# Patient Record
Sex: Male | Born: 1950 | Race: White | Hispanic: No | Marital: Married | Smoking: Former smoker
Health system: Southern US, Community
[De-identification: ages and names within clinical notes are randomized; demographics above are authoritative.]

## PROBLEM LIST (undated history)

## (undated) DIAGNOSIS — C3490 Malignant neoplasm of unspecified part of unspecified bronchus or lung: Secondary | ICD-10-CM

## (undated) DIAGNOSIS — E7800 Pure hypercholesterolemia, unspecified: Secondary | ICD-10-CM

## (undated) DIAGNOSIS — G8929 Other chronic pain: Secondary | ICD-10-CM

## (undated) DIAGNOSIS — Z9989 Dependence on other enabling machines and devices: Secondary | ICD-10-CM

## (undated) DIAGNOSIS — J9611 Chronic respiratory failure with hypoxia: Secondary | ICD-10-CM

## (undated) DIAGNOSIS — M545 Low back pain, unspecified: Secondary | ICD-10-CM

## (undated) DIAGNOSIS — K529 Noninfective gastroenteritis and colitis, unspecified: Secondary | ICD-10-CM

## (undated) DIAGNOSIS — I739 Peripheral vascular disease, unspecified: Secondary | ICD-10-CM

## (undated) DIAGNOSIS — Z77098 Contact with and (suspected) exposure to other hazardous, chiefly nonmedicinal, chemicals: Secondary | ICD-10-CM

## (undated) DIAGNOSIS — I719 Aortic aneurysm of unspecified site, without rupture: Secondary | ICD-10-CM

## (undated) DIAGNOSIS — M549 Dorsalgia, unspecified: Secondary | ICD-10-CM

## (undated) DIAGNOSIS — G4733 Obstructive sleep apnea (adult) (pediatric): Secondary | ICD-10-CM

## (undated) DIAGNOSIS — R0602 Shortness of breath: Secondary | ICD-10-CM

## (undated) DIAGNOSIS — K219 Gastro-esophageal reflux disease without esophagitis: Secondary | ICD-10-CM

## (undated) DIAGNOSIS — N400 Enlarged prostate without lower urinary tract symptoms: Secondary | ICD-10-CM

## (undated) DIAGNOSIS — J449 Chronic obstructive pulmonary disease, unspecified: Secondary | ICD-10-CM

## (undated) DIAGNOSIS — I10 Essential (primary) hypertension: Secondary | ICD-10-CM

## (undated) HISTORY — PX: COLON SURGERY: SHX602

## (undated) HISTORY — DX: Aortic aneurysm of unspecified site, without rupture: I71.9

## (undated) HISTORY — PX: FEMORAL ARTERY STENT: SHX1583

## (undated) HISTORY — DX: Contact with and (suspected) exposure to other hazardous, chiefly nonmedicinal, chemicals: Z77.098

---

## 2003-10-02 HISTORY — PX: LUNG LOBECTOMY: SHX167

## 2008-05-22 ENCOUNTER — Encounter: Admission: RE | Admit: 2008-05-22 | Discharge: 2008-05-22 | Payer: Self-pay | Admitting: Neurosurgery

## 2009-05-12 ENCOUNTER — Encounter: Payer: Self-pay | Admitting: Internal Medicine

## 2009-09-19 ENCOUNTER — Encounter: Payer: Self-pay | Admitting: Internal Medicine

## 2009-10-27 ENCOUNTER — Encounter: Payer: Self-pay | Admitting: Internal Medicine

## 2009-11-01 ENCOUNTER — Ambulatory Visit: Payer: Self-pay | Admitting: Internal Medicine

## 2009-11-01 DIAGNOSIS — N400 Enlarged prostate without lower urinary tract symptoms: Secondary | ICD-10-CM

## 2009-11-01 DIAGNOSIS — I739 Peripheral vascular disease, unspecified: Secondary | ICD-10-CM | POA: Insufficient documentation

## 2009-11-01 DIAGNOSIS — Z85118 Personal history of other malignant neoplasm of bronchus and lung: Secondary | ICD-10-CM

## 2009-11-01 DIAGNOSIS — Z8719 Personal history of other diseases of the digestive system: Secondary | ICD-10-CM

## 2009-11-01 DIAGNOSIS — K219 Gastro-esophageal reflux disease without esophagitis: Secondary | ICD-10-CM

## 2009-11-01 DIAGNOSIS — I10 Essential (primary) hypertension: Secondary | ICD-10-CM | POA: Insufficient documentation

## 2009-11-01 DIAGNOSIS — E785 Hyperlipidemia, unspecified: Secondary | ICD-10-CM | POA: Insufficient documentation

## 2009-11-01 DIAGNOSIS — M5126 Other intervertebral disc displacement, lumbar region: Secondary | ICD-10-CM

## 2009-11-01 DIAGNOSIS — M545 Low back pain: Secondary | ICD-10-CM | POA: Insufficient documentation

## 2009-11-01 HISTORY — DX: Personal history of other malignant neoplasm of bronchus and lung: Z85.118

## 2009-11-01 LAB — CONVERTED CEMR LAB: Cholesterol, target level: 200 mg/dL

## 2009-11-30 ENCOUNTER — Ambulatory Visit: Payer: Self-pay | Admitting: Internal Medicine

## 2009-12-21 ENCOUNTER — Encounter
Admission: RE | Admit: 2009-12-21 | Discharge: 2009-12-23 | Payer: Self-pay | Admitting: Physical Medicine & Rehabilitation

## 2009-12-23 ENCOUNTER — Ambulatory Visit: Payer: Self-pay | Admitting: Physical Medicine & Rehabilitation

## 2010-10-31 NOTE — Assessment & Plan Note (Signed)
Summary: 1 MO ROV /NWS  #   Vital Signs:  Patient profile:   60 year old male Height:      74 inches Weight:      201 pounds BMI:     25.90 O2 Sat:      93 % on Room air Temp:     97.7 degrees F oral Pulse rate:   90 / minute Pulse rhythm:   regular Resp:     16 per minute BP sitting:   138 / 86  (left arm) Cuff size:   large  Vitals Entered By: Rock Nephew CMA (November 30, 2009 8:34 AM)  Nutrition Counseling: Patient's BMI is greater than 25 and therefore counseled on weight management options.  O2 Flow:  Room air  Primary Care Provider:  Etta Grandchild MD   History of Present Illness: He returns for f/up and states that he feels much better. He went to the [place] recently and had a 6 month check-up and they added some meds for LBP and high trigs.  Dyspepsia History:      There is a prior history of GERD.  The patient does not have a prior history of documented ulcer disease.    Hypertension History:      He denies headache, chest pain, palpitations, dyspnea with exertion, orthopnea, PND, peripheral edema, visual symptoms, neurologic problems, syncope, and side effects from treatment.  He notes no problems with any antihypertensive medication side effects.        Positive major cardiovascular risk factors include male age 10 years old or older, hyperlipidemia, and hypertension.  Negative major cardiovascular risk factors include no history of diabetes, negative family history for ischemic heart disease, and non-tobacco-user status.        Positive history for target organ damage include peripheral vascular disease.  Further assessment for target organ damage reveals no history of ASHD, cardiac end-organ damage (CHF/LVH), stroke/TIA, renal insufficiency, or hypertensive retinopathy.    Lipid Management History:      Positive NCEP/ATP III risk factors include male age 39 years old or older, hypertension, and peripheral vascular disease.  Negative NCEP/ATP III risk factors include  non-diabetic, no family history for ischemic heart disease, non-tobacco-user status, no ASHD (atherosclerotic heart disease), no prior stroke/TIA, and no history of aortic aneurysm.        The patient states that he knows about the "Therapeutic Lifestyle Change" diet.  His compliance with the TLC diet is good.  The patient expresses understanding of adjunctive measures for cholesterol lowering.  Adjunctive measures started by the patient include aerobic exercise, fiber, limit alcohol consumpton, and weight reduction.  He expresses no side effects from his lipid-lowering medication.  The patient denies any symptoms to suggest myopathy or liver disease.       Preventive Screening-Counseling & Management  Alcohol-Tobacco     Alcohol drinks/day: 0     Smoking Status: quit     Smoke Cessation Stage: quit     Year Started: 1966     Year Quit: 2005     Pack years: 40  Hep-HIV-STD-Contraception     Hepatitis Risk: no risk noted     HIV Risk: no risk noted     STD Risk: no risk noted  Medications Prior to Update: 1)  Theochron 300 Mg Xr12h-Tab (Theophylline) .... Take 1 Tablet By Mouth Two Times A Day 2)  Simvastatin 20 Mg Tabs (Simvastatin) .... Take 1 Tablet By Mouth Once A Day 3)  Klor-Con  M20 20 Meq Cr-Tabs (Potassium Chloride Crys Cr) .... Take 1 Tablet By Mouth Once A Day 4)  Omeprazole 20 Mg Tbec (Omeprazole) .... Take 1 Tablet By Mouth Two Times A Day 5)  Proair Hfa 108 (90 Base) Mcg/act Aers (Albuterol Sulfate) .Marland Kitchen.. 1-2 Puffs Prn 6)  Albuterol .Marland Kitchen.. 1 Vial Qid 7)  Terazosin 2mg  .... Take 1 Tablet By Mouth Once A Day 8)  Doxycycline Hyclate 100 Mg Caps (Doxycycline Hyclate) .Marland Kitchen.. 1 Every 12hrs 9)  Prednisone 20 Mg Tabs (Prednisone) .... Take 1 Tablet By Mouth Three Times A Day 10)  Spiriva Handihaler 18 Mcg Caps (Tiotropium Bromide Monohydrate) .... One Puff Once Daily 11)  Dulera 200-5 Mcg/act Aero (Mometasone Furo-Formoterol Fum) .... 2 Puffs Bid 12)  Valturna 150-160 Mg Tabs  (Aliskiren-Valsartan) .... Once Daily For High Blood Pressure  Current Medications (verified): 1)  Simvastatin 20 Mg Tabs (Simvastatin) .... Take 1 Tablet By Mouth Once A Day 2)  Klor-Con M20 20 Meq Cr-Tabs (Potassium Chloride Crys Cr) .... Take 1 Tablet By Mouth Once A Day 3)  Omeprazole 20 Mg Tbec (Omeprazole) .... Take 1 Tablet By Mouth Two Times A Day 4)  Proair Hfa 108 (90 Base) Mcg/act Aers (Albuterol Sulfate) .Marland Kitchen.. 1-2 Puffs Prn 5)  Albuterol .Marland Kitchen.. 1 Vial Qid 6)  Terazosin 2mg  .... Take 1 Tablet By Mouth Once A Day 7)  Spiriva Handihaler 18 Mcg Caps (Tiotropium Bromide Monohydrate) .... One Puff Once Daily 8)  Dulera 200-5 Mcg/act Aero (Mometasone Furo-Formoterol Fum) .... 2 Puffs Bid 9)  Valturna 150-160 Mg Tabs (Aliskiren-Valsartan) .... Once Daily For High Blood Pressure 10)  Tramadol Hcl 50 Mg Tabs (Tramadol Hcl) .... 2 Three Times A Day 11)  Amitriptyline Hcl 25 Mg Tabs (Amitriptyline Hcl) .... Take 1 Tab By Mouth At Bedtime 12)  Hydrocodone-Acetaminophen 10-500 Mg Tabs (Hydrocodone-Acetaminophen) .Marland Kitchen.. 1 Every 6hrs Prn 13)  Genifibrosic 600mg  .... Take 1 Tablet By Mouth Two Times A Day  Allergies (verified): 1)  ! Penicillin 2)  ! * Meloxicam 3)  ! Ace Inhibitors 4)  ! Amlodipine Besy-Benazepril Hcl (Amlodipine Besy-Benazepril Hcl)  Past History:  Past Medical History: Reviewed history from 11/01/2009 and no changes required. Lung cancer, hx of: treated at [facility] in 2005 with surgery and chemo. COPD Diverticulitis, hx of GERD Hyperlipidemia Hypertension Low back pain Peripheral vascular disease Benign prostatic hypertrophy  Past Surgical History: Reviewed history from 11/01/2009 and no changes required. Lung-wedge resection stent in left groin  Family History: Reviewed history from 11/01/2009 and no changes required. Family History of Sudden Death both parents at an early age  Social History: Reviewed history from 11/01/2009 and no changes  required. Retired Married Alcohol use-no Drug use-no Regular exercise-yes  Review of Systems  The patient denies anorexia, weight loss, chest pain, peripheral edema, prolonged cough, abdominal pain, hematuria, difficulty walking, and depression.   Resp:  Complains of wheezing; denies chest discomfort, chest pain with inspiration, cough, coughing up blood, pleuritic, shortness of breath, and sputum productive.  Physical Exam  General:  alert, well-developed, well-nourished, well-hydrated, appropriate dress, normal appearance, healthy-appearing, cooperative to examination, good hygiene, and underweight appearing.   Head:  normocephalic, atraumatic, no abnormalities observed, and no abnormalities palpated.   Mouth:  Oral mucosa and oropharynx without lesions or exudates.  Teeth in good repair. Neck:  supple, full ROM, no masses, no thyromegaly, no thyroid nodules or tenderness, no JVD, normal carotid upstroke, no carotid bruits, and no cervical lymphadenopathy.   Lungs:  Normal respiratory effort, chest  expands symmetrically. Lungs are clear to auscultation, no crackles or wheezes. Heart:  Normal rate and regular rhythm. S1 and S2 normal without gallop, murmur, click, rub or other extra sounds. Abdomen:  soft, non-tender, normal bowel sounds, no distention, no masses, no guarding, no rigidity, no rebound tenderness, no abdominal hernia, no inguinal hernia, no hepatomegaly, and no splenomegaly.   Msk:  normal ROM, no joint tenderness, no joint swelling, no joint warmth, no redness over joints, no joint deformities, no joint instability, and no crepitation.   Extremities:  No clubbing, cyanosis, edema, or deformity noted with normal full range of motion of all joints.   Skin:  turgor normal, color normal, no rashes, no suspicious lesions, no ecchymoses, and no petechiae.   Psych:  Cognition and judgment appear intact. Alert and cooperative with normal attention span and concentration. No apparent  delusions, illusions, hallucinations   Impression & Recommendations:  Problem # 1:  HYPERTENSION (ICD-401.9) Assessment Improved  His updated medication list for this problem includes:    Valturna 150-160 Mg Tabs (Aliskiren-valsartan) ..... Once daily for high blood pressure  BP today: 138/86 Prior BP: 150/82 (11/01/2009)  Prior 10 Yr Risk Heart Disease: Not enough information (11/01/2009)  Problem # 2:  COPD (ICD-496) Assessment: Improved  The following medications were removed from the medication list:    Theochron 300 Mg Xr12h-tab (Theophylline) .Marland Kitchen... Take 1 tablet by mouth two times a day His updated medication list for this problem includes:    Proair Hfa 108 (90 Base) Mcg/act Aers (Albuterol sulfate) .Marland Kitchen... 1-2 puffs prn    Spiriva Handihaler 18 Mcg Caps (Tiotropium bromide monohydrate) ..... One puff once daily    Dulera 200-5 Mcg/act Aero (Mometasone furo-formoterol fum) .Marland Kitchen... 2 puffs bid  Pulmonary Functions Reviewed: O2 sat: 93 (11/30/2009)     Vaccines Reviewed: Pneumovax: Pneumovax (07/09/2007)     Problem # 3:  HYPERLIPIDEMIA (ICD-272.4) Assessment: Unchanged  His updated medication list for this problem includes:    Simvastatin 20 Mg Tabs (Simvastatin) .Marland Kitchen... Take 1 tablet by mouth once a day  Lipid Goals: Chol Goal: 200 (11/01/2009)   HDL Goal: 40 (11/01/2009)   LDL Goal: 100 (11/01/2009)   TG Goal: 150 (11/01/2009)  Prior 10 Yr Risk Heart Disease: Not enough information (11/01/2009)  Complete Medication List: 1)  Simvastatin 20 Mg Tabs (Simvastatin) .... Take 1 tablet by mouth once a day 2)  Klor-con M20 20 Meq Cr-tabs (Potassium chloride crys cr) .... Take 1 tablet by mouth once a day 3)  Omeprazole 20 Mg Tbec (Omeprazole) .... Take 1 tablet by mouth two times a day 4)  Proair Hfa 108 (90 Base) Mcg/act Aers (Albuterol sulfate) .Marland Kitchen.. 1-2 puffs prn 5)  Albuterol  .Marland Kitchen.. 1 vial qid 6)  Terazosin 2mg   .... Take 1 tablet by mouth once a day 7)  Spiriva  Handihaler 18 Mcg Caps (Tiotropium bromide monohydrate) .... One puff once daily 8)  Dulera 200-5 Mcg/act Aero (Mometasone furo-formoterol fum) .... 2 puffs bid 9)  Valturna 150-160 Mg Tabs (Aliskiren-valsartan) .... Once daily for high blood pressure 10)  Tramadol Hcl 50 Mg Tabs (Tramadol hcl) .... 2 three times a day 11)  Amitriptyline Hcl 25 Mg Tabs (Amitriptyline hcl) .... Take 1 tab by mouth at bedtime 12)  Hydrocodone-acetaminophen 10-500 Mg Tabs (Hydrocodone-acetaminophen) .Marland Kitchen.. 1 every 6hrs prn 13)  Genifibrosic 600mg   .... Take 1 tablet by mouth two times a day  Other Orders: Tdap => 37yrs IM (40981) Admin 1st Vaccine (19147)  Hypertension Assessment/Plan:  The patient's hypertensive risk group is category C: Target organ damage and/or diabetes.  Today's blood pressure is 138/86.  His blood pressure goal is < 140/90.  Lipid Assessment/Plan:      Based on NCEP/ATP III, the patient's risk factor category is "history of coronary disease, peripheral vascular disease, cerebrovascular disease, or aortic aneurysm".  The patient's lipid goals are as follows: Total cholesterol goal is 200; LDL cholesterol goal is 100; HDL cholesterol goal is 40; Triglyceride goal is 150.    Patient Instructions: 1)  Please schedule a follow-up appointment in 4 months. 2)  It is important that you exercise regularly at least 20 minutes 5 times a week. If you develop chest pain, have severe difficulty breathing, or feel very tired , stop exercising immediately and seek medical attention. 3)  Check your Blood Pressure regularly. If it is above 140/90: you should make an appointment. Prescriptions: VALTURNA 150-160 MG TABS (ALISKIREN-VALSARTAN) once daily for high blood pressure  #70 x 0   Entered and Authorized by:   Etta Grandchild MD   Signed by:   Etta Grandchild MD on 11/30/2009   Method used:   Samples Given   RxID:   [id number]    Immunizations Administered:  Tetanus Vaccine:    Vaccine  Type: Tdap    Site: left deltoid    Mfr: [employer]    Dose: 0.5 ml    Route: IM    Given by: Rock Nephew CMA    Exp. Date: 11/26/2011    Lot #: [id number]    VIS given: 08/19/07 version given November 30, 2009.

## 2010-10-31 NOTE — Letter (Signed)
Summary: Medication List/[facility]  Medication List/[facility]   Imported By: Sherian Rein 11/04/2009 07:19:22  _____________________________________________________________________  External Attachment:    Type:   Image     Comment:   External Document

## 2010-10-31 NOTE — Assessment & Plan Note (Signed)
Summary: NEW PT/BCBS/#/LB   Vital Signs:  Patient profile:   60 year old male Height:      74 inches Weight:      199 pounds BMI:     25.64 O2 Sat:      95 % on Room air Temp:     97.1 degrees F oral Pulse rate:   80 / minute Pulse rhythm:   regular Resp:     16 per minute BP sitting:   150 / 82  (left arm) Cuff size:   large  Vitals Entered By: Rock Nephew CMA (November 01, 2009 9:09 AM)  Nutrition Counseling: Patient's BMI is greater than 25 and therefore counseled on weight management options.  O2 Flow:  Room air  Primary Care Provider:  Etta Grandchild MD   History of Present Illness: New to me he complains of an exacerbation of COPD last week that sent him to the [facility] and [facility] where he had Chest Xrays and EKG's done. He feels better after starting prednisone and doxy., but he still  has a mild NP cough.  He is on a complicated, duplicated meds list from the [place] ( 2 ccb's, an ACEI and he coughs, and multiple inhalers ) see scanned document.   He has chronic LBP from a protruding disc ( see scanned MRI report ) and has been seen by a neurosurgeon and was told that he may need a fusion but he does not want to do surgery yet. He has not had a good response to Meloxicam or Lortab.  He had a complete physical 8 months ago at the [place].  Dyspepsia History:      He has no alarm features of dyspepsia including no history of melena, hematochezia, dysphagia, persistent vomiting, or involuntary weight loss > 5%.  There is a prior history of GERD.  The patient does not have a prior history of documented ulcer disease.  The dominant symptom is heartburn or acid reflux.  An H-2 blocker medication is currently being taken.  He notes that the symptoms have improved with the H-2 blocker therapy.  Symptoms have not persisted after 4 weeks of H-2 blocker treatment.    Hypertension History:      He complains of side effects from treatment, but denies headache, chest pain, palpitations,  orthopnea, peripheral edema, visual symptoms, neurologic problems, and syncope.  He notes the following problems with antihypertensive medication side effects: cough.        Positive major cardiovascular risk factors include male age 14 years old or older, hyperlipidemia, and hypertension.  Negative major cardiovascular risk factors include no history of diabetes, negative family history for ischemic heart disease, and non-tobacco-user status.        Positive history for target organ damage include peripheral vascular disease.  Further assessment for target organ damage reveals no history of ASHD, cardiac end-organ damage (CHF/LVH), stroke/TIA, renal insufficiency, or hypertensive retinopathy.    Lipid Management History:      Positive NCEP/ATP III risk factors include male age 52 years old or older, hypertension, and peripheral vascular disease.  Negative NCEP/ATP III risk factors include non-diabetic, no family history for ischemic heart disease, non-tobacco-user status, no ASHD (atherosclerotic heart disease), no prior stroke/TIA, and no history of aortic aneurysm.        The patient states that he knows about the "Therapeutic Lifestyle Change" diet.  His compliance with the TLC diet is good.  The patient expresses understanding of adjunctive measures for cholesterol  lowering.  Adjunctive measures started by the patient include aerobic exercise, fiber, limit alcohol consumpton, and weight reduction.  He expresses no side effects from his lipid-lowering medication.  The patient denies any symptoms to suggest myopathy or liver disease.       Preventive Screening-Counseling & Management  Alcohol-Tobacco     Alcohol drinks/day: 0     Smoking Status: quit     Smoke Cessation Stage: quit     Year Started: 1966     Year Quit: 2005     Pack years: 11  Caffeine-Diet-Exercise     Does Patient Exercise: yes  Hep-HIV-STD-Contraception     Hepatitis Risk: no risk noted     HIV Risk: no risk noted      STD Risk: no risk noted      Sexual History:  currently monogamous.        Drug Use:  never and no.        Blood Transfusions:  no.    Medications Prior to Update: 1)  None  Allergies (verified): 1)  ! Penicillin 2)  ! * Meloxicam 3)  ! Ace Inhibitors 4)  ! Amlodipine Besy-Benazepril Hcl (Amlodipine Besy-Benazepril Hcl)  Past History:  Past Medical History: Lung cancer, hx of: treated at [facility] in 2005 with surgery and chemo. COPD Diverticulitis, hx of GERD Hyperlipidemia Hypertension Low back pain Peripheral vascular disease Benign prostatic hypertrophy  Past Surgical History: Lung-wedge resection stent in left groin  Family History: Family History of Sudden Death both parents at an early age  Social History: Retired Married Alcohol use-no Drug use-no Regular exercise-yes Drug Use:  never, no Does Patient Exercise:  yes Smoking Status:  quit Hepatitis Risk:  no risk noted HIV Risk:  no risk noted STD Risk:  no risk noted Sexual History:  currently monogamous Blood Transfusions:  no  Review of Systems CV:  Complains of shortness of breath with exertion; denies bluish discoloration of lips or nails, chest pain or discomfort, difficulty breathing at night, fainting, fatigue, leg cramps with exertion, lightheadness, near fainting, palpitations, and swelling of feet. Resp:  Complains of cough and shortness of breath; denies chest discomfort, chest pain with inspiration, coughing up blood, pleuritic, sputum productive, and wheezing.  Physical Exam  General:  alert, well-developed, well-nourished, well-hydrated, appropriate dress, normal appearance, healthy-appearing, cooperative to examination, good hygiene, and underweight appearing.   Head:  normocephalic, atraumatic, no abnormalities observed, and no abnormalities palpated.   Eyes:  vision grossly intact, pupils equal, pupils round, and pupils reactive to light.   Ears:  R ear normal and L ear normal.     Nose:  External nasal examination shows no deformity or inflammation. Nasal mucosa are pink and moist without lesions or exudates. Mouth:  Oral mucosa and oropharynx without lesions or exudates.  Teeth in good repair. Neck:  supple, full ROM, no masses, no thyromegaly, no thyroid nodules or tenderness, no JVD, normal carotid upstroke, no carotid bruits, and no cervical lymphadenopathy.   Lungs:  Normal respiratory effort, chest expands symmetrically. Lungs are clear to auscultation, no crackles or wheezes. Heart:  Normal rate and regular rhythm. S1 and S2 normal without gallop, murmur, click, rub or other extra sounds. Abdomen:  soft, non-tender, normal bowel sounds, no distention, no masses, no guarding, no rigidity, no rebound tenderness, no abdominal hernia, no inguinal hernia, no hepatomegaly, and no splenomegaly.   Msk:  normal ROM, no joint tenderness, no joint swelling, no joint warmth, no redness over joints, no joint  deformities, no joint instability, and no crepitation.   Pulses:  R femoral decreased, R popliteal decreased, R posterior tibial decreased, R dorsalis pedis decreased, L femoral decreased, L popliteal decreased, L posterior tibial decreased, and L dorsalis pedis decreased.   Extremities:  No clubbing, cyanosis, edema, or deformity noted with normal full range of motion of all joints.   Neurologic:  No cranial nerve deficits noted. Station and gait are normal. Plantar reflexes are down-going bilaterally. DTRs are symmetrical throughout. Sensory, motor and coordinative functions appear intact. Skin:  turgor normal, color normal, no rashes, no suspicious lesions, no ecchymoses, and no petechiae.   Cervical Nodes:  no anterior cervical adenopathy and no posterior cervical adenopathy.   Axillary Nodes:  no R axillary adenopathy and no L axillary adenopathy.   Inguinal Nodes:  no R inguinal adenopathy and no L inguinal adenopathy.   Psych:  Cognition and judgment appear intact. Alert  and cooperative with normal attention span and concentration. 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 Take 1 tablet by mouth once a day    Hydrochlorothiazide 25 Mg Tabs (Hydrochlorothiazide) .Marland Kitchen... Take 1 tablet by mouth once a day    Cardizem La 240 Mg Xr24h-tab (Diltiazem hcl coated beads) .Marland Kitchen... 2 times daily    Amlodipine Besylate 10 Mg Tabs (Amlodipine besylate) .Marland Kitchen... Take 1 tab by mouth at bedtime His updated medication list for this problem includes:    Valturna 150-160 Mg Tabs (Aliskiren-valsartan) ..... Once daily for high blood pressure  Problem # 3:  COPD (ICD-496) Assessment: Unchanged records have been requested from the [facility] and [facility] The following medications were removed from the medication list:    Foradil Aerolizer 12 Mcg Caps (Formoterol fumarate) .Marland Kitchen... 2 times daily His updated medication list for this problem includes:    Theochron 300 Mg Xr12h-tab (Theophylline) .Marland Kitchen... Take 1 tablet by mouth two times a day    Proair Hfa 108 (90 Base) Mcg/act Aers (Albuterol sulfate) .Marland Kitchen... 1-2 puffs prn    Spiriva Handihaler 18 Mcg Caps (Tiotropium bromide monohydrate) ..... One puff once daily    Dulera 200-5 Mcg/act Aero (Mometasone furo-formoterol fum) .Marland Kitchen... 2 puffs bid  Complete Medication List: 1)  Theochron 300 Mg Xr12h-tab (Theophylline) .... Take 1 tablet by mouth two times a day 2)  Simvastatin 20 Mg Tabs (Simvastatin) .... Take 1 tablet by mouth once a day 3)  Klor-con M20 20 Meq Cr-tabs (Potassium chloride crys cr) .... Take 1 tablet by mouth once a day 4)  Omeprazole 20 Mg Tbec (Omeprazole) .... Take 1 tablet by mouth two times a day 5)  Proair  Hfa 108 (90 Base) Mcg/act Aers (Albuterol sulfate) .Marland Kitchen.. 1-2 puffs prn 6)  Albuterol  .Marland Kitchen.. 1 vial qid 7)  Terazosin 2mg   .... Take 1 tablet by mouth once a day 8)  Doxycycline Hyclate 100 Mg Caps (Doxycycline hyclate) .Marland Kitchen.. 1 every 12hrs 9)  Prednisone 20 Mg Tabs (Prednisone) .... Take 1 tablet by mouth three times a day 10)  Spiriva Handihaler 18 Mcg Caps (Tiotropium bromide monohydrate) .... One puff once daily 11)  Dulera 200-5 Mcg/act Aero (Mometasone furo-formoterol fum) .... 2 puffs bid 12)  Valturna 150-160 Mg Tabs (Aliskiren-valsartan) .... Once daily for high blood  pressure  Hypertension Assessment/Plan:      The patient's hypertensive risk group is category C: Target organ damage and/or diabetes.  Today's blood pressure is 150/82.  His blood pressure goal is < 140/90.  Lipid Assessment/Plan:      Based on NCEP/ATP III, the patient's risk factor category is "history of coronary disease, peripheral vascular disease, cerebrovascular disease, or aortic aneurysm".  The patient's lipid goals are as follows: Total cholesterol goal is 200; LDL cholesterol goal is 100; HDL cholesterol goal is 40; Triglyceride goal is 150.    Colorectal Screening:  Colonoscopy Results:    Date of Exam: 06/17/2007    Results: Normal  Immunization & Chemoprophylaxis:    Pneumovax: Pneumovax  (07/09/2007)  Patient Instructions: 1)  Please schedule a follow-up appointment in 1 month. 2)  Check your Blood Pressure regularly. If it is above 140/90: you should make an appointment. Prescriptions: SPIRIVA HANDIHALER 18 MCG CAPS (TIOTROPIUM BROMIDE MONOHYDRATE) One puff once daily  #0 x 0   Entered and Authorized by:   Etta Grandchild MD   Signed by:   Etta Grandchild MD on 11/01/2009   Method used:   Historical   RxID:   [id number] VALTURNA 150-160 MG TABS (ALISKIREN-VALSARTAN) once daily for high blood pressure  #35 x 0   Entered and Authorized by:   Etta Grandchild MD   Signed by:   Etta Grandchild MD  on 11/01/2009   Method used:   Samples Given   RxID:   [id number] DULERA 200-5 MCG/ACT AERO (MOMETASONE FURO-FORMOTEROL FUM) 2 puffs BID  #6 inhs x 0   Entered and Authorized by:   Etta Grandchild MD   Signed by:   Etta Grandchild MD on 11/01/2009   Method used:   Samples Given   RxID:   [id number]    Pneumovax Immunization History:    Pneumovax # 1:  Pneumovax (07/09/2007)

## 2012-11-07 ENCOUNTER — Inpatient Hospital Stay (HOSPITAL_COMMUNITY): Payer: BC Managed Care – PPO

## 2012-11-07 ENCOUNTER — Emergency Department (HOSPITAL_BASED_OUTPATIENT_CLINIC_OR_DEPARTMENT_OTHER): Payer: BC Managed Care – PPO

## 2012-11-07 ENCOUNTER — Inpatient Hospital Stay (HOSPITAL_BASED_OUTPATIENT_CLINIC_OR_DEPARTMENT_OTHER)
Admission: EM | Admit: 2012-11-07 | Discharge: 2012-11-17 | DRG: 585 | Disposition: A | Discharge status: Home / Self Care | Payer: BC Managed Care – PPO | Attending: Internal Medicine | Admitting: Internal Medicine

## 2012-11-07 ENCOUNTER — Encounter (HOSPITAL_BASED_OUTPATIENT_CLINIC_OR_DEPARTMENT_OTHER): Payer: Self-pay | Admitting: Family Medicine

## 2012-11-07 DIAGNOSIS — R197 Diarrhea, unspecified: Secondary | ICD-10-CM

## 2012-11-07 DIAGNOSIS — K929 Disease of digestive system, unspecified: Secondary | ICD-10-CM | POA: Diagnosis not present

## 2012-11-07 DIAGNOSIS — Z85118 Personal history of other malignant neoplasm of bronchus and lung: Secondary | ICD-10-CM

## 2012-11-07 DIAGNOSIS — R112 Nausea with vomiting, unspecified: Secondary | ICD-10-CM | POA: Diagnosis present

## 2012-11-07 DIAGNOSIS — I959 Hypotension, unspecified: Secondary | ICD-10-CM

## 2012-11-07 DIAGNOSIS — E878 Other disorders of electrolyte and fluid balance, not elsewhere classified: Secondary | ICD-10-CM | POA: Diagnosis present

## 2012-11-07 DIAGNOSIS — T68XXXA Hypothermia, initial encounter: Secondary | ICD-10-CM

## 2012-11-07 DIAGNOSIS — R7309 Other abnormal glucose: Secondary | ICD-10-CM | POA: Diagnosis present

## 2012-11-07 DIAGNOSIS — D62 Acute posthemorrhagic anemia: Secondary | ICD-10-CM | POA: Diagnosis not present

## 2012-11-07 DIAGNOSIS — R1110 Vomiting, unspecified: Secondary | ICD-10-CM

## 2012-11-07 DIAGNOSIS — R571 Hypovolemic shock: Secondary | ICD-10-CM

## 2012-11-07 DIAGNOSIS — K559 Vascular disorder of intestine, unspecified: Principal | ICD-10-CM

## 2012-11-07 DIAGNOSIS — R680 Hypothermia, not associated with low environmental temperature: Secondary | ICD-10-CM | POA: Diagnosis present

## 2012-11-07 DIAGNOSIS — E876 Hypokalemia: Secondary | ICD-10-CM | POA: Diagnosis not present

## 2012-11-07 DIAGNOSIS — N400 Enlarged prostate without lower urinary tract symptoms: Secondary | ICD-10-CM

## 2012-11-07 DIAGNOSIS — E7800 Pure hypercholesterolemia, unspecified: Secondary | ICD-10-CM | POA: Diagnosis present

## 2012-11-07 DIAGNOSIS — R404 Transient alteration of awareness: Secondary | ICD-10-CM | POA: Diagnosis not present

## 2012-11-07 DIAGNOSIS — J4489 Other specified chronic obstructive pulmonary disease: Secondary | ICD-10-CM

## 2012-11-07 DIAGNOSIS — R339 Retention of urine, unspecified: Secondary | ICD-10-CM | POA: Diagnosis not present

## 2012-11-07 DIAGNOSIS — R6510 Systemic inflammatory response syndrome (SIRS) of non-infectious origin without acute organ dysfunction: Secondary | ICD-10-CM

## 2012-11-07 DIAGNOSIS — R109 Unspecified abdominal pain: Secondary | ICD-10-CM

## 2012-11-07 DIAGNOSIS — K560 Paralytic ileus: Secondary | ICD-10-CM | POA: Diagnosis not present

## 2012-11-07 DIAGNOSIS — A419 Sepsis, unspecified organism: Secondary | ICD-10-CM

## 2012-11-07 DIAGNOSIS — T40605A Adverse effect of unspecified narcotics, initial encounter: Secondary | ICD-10-CM | POA: Diagnosis not present

## 2012-11-07 DIAGNOSIS — Z8679 Personal history of other diseases of the circulatory system: Secondary | ICD-10-CM

## 2012-11-07 DIAGNOSIS — J9819 Other pulmonary collapse: Secondary | ICD-10-CM | POA: Diagnosis present

## 2012-11-07 DIAGNOSIS — I739 Peripheral vascular disease, unspecified: Secondary | ICD-10-CM

## 2012-11-07 DIAGNOSIS — E871 Hypo-osmolality and hyponatremia: Secondary | ICD-10-CM | POA: Diagnosis present

## 2012-11-07 DIAGNOSIS — E785 Hyperlipidemia, unspecified: Secondary | ICD-10-CM

## 2012-11-07 DIAGNOSIS — R220 Localized swelling, mass and lump, head: Secondary | ICD-10-CM

## 2012-11-07 DIAGNOSIS — R578 Other shock: Secondary | ICD-10-CM

## 2012-11-07 DIAGNOSIS — K219 Gastro-esophageal reflux disease without esophagitis: Secondary | ICD-10-CM

## 2012-11-07 DIAGNOSIS — M5126 Other intervertebral disc displacement, lumbar region: Secondary | ICD-10-CM

## 2012-11-07 DIAGNOSIS — M545 Low back pain, unspecified: Secondary | ICD-10-CM

## 2012-11-07 DIAGNOSIS — I4891 Unspecified atrial fibrillation: Secondary | ICD-10-CM | POA: Diagnosis present

## 2012-11-07 DIAGNOSIS — R748 Abnormal levels of other serum enzymes: Secondary | ICD-10-CM

## 2012-11-07 DIAGNOSIS — I480 Paroxysmal atrial fibrillation: Secondary | ICD-10-CM

## 2012-11-07 DIAGNOSIS — J449 Chronic obstructive pulmonary disease, unspecified: Secondary | ICD-10-CM

## 2012-11-07 DIAGNOSIS — K529 Noninfective gastroenteritis and colitis, unspecified: Secondary | ICD-10-CM

## 2012-11-07 DIAGNOSIS — Y832 Surgical operation with anastomosis, bypass or graft as the cause of abnormal reaction of the patient, or of later complication, without mention of misadventure at the time of the procedure: Secondary | ICD-10-CM | POA: Diagnosis not present

## 2012-11-07 DIAGNOSIS — R188 Other ascites: Secondary | ICD-10-CM | POA: Diagnosis present

## 2012-11-07 DIAGNOSIS — R739 Hyperglycemia, unspecified: Secondary | ICD-10-CM

## 2012-11-07 DIAGNOSIS — D72829 Elevated white blood cell count, unspecified: Secondary | ICD-10-CM

## 2012-11-07 DIAGNOSIS — I10 Essential (primary) hypertension: Secondary | ICD-10-CM

## 2012-11-07 DIAGNOSIS — K650 Generalized (acute) peritonitis: Secondary | ICD-10-CM

## 2012-11-07 DIAGNOSIS — Z8719 Personal history of other diseases of the digestive system: Secondary | ICD-10-CM

## 2012-11-07 HISTORY — DX: Essential (primary) hypertension: I10

## 2012-11-07 HISTORY — DX: Chronic obstructive pulmonary disease, unspecified: J44.9

## 2012-11-07 HISTORY — DX: Pure hypercholesterolemia, unspecified: E78.00

## 2012-11-07 HISTORY — DX: Malignant neoplasm of unspecified part of unspecified bronchus or lung: C34.90

## 2012-11-07 LAB — COMPREHENSIVE METABOLIC PANEL
ALT: 20 U/L (ref 0–53)
AST: 26 U/L (ref 0–37)
Albumin: 4.6 g/dL (ref 3.5–5.2)
Alkaline Phosphatase: 299 U/L — ABNORMAL HIGH (ref 39–117)
GFR calc Af Amer: 73 mL/min — ABNORMAL LOW (ref >90)
Glucose, Bld: 163 mg/dL — ABNORMAL HIGH (ref 70–99)
Potassium: 4.1 mEq/L (ref 3.5–5.1)
Sodium: 133 mEq/L — ABNORMAL LOW (ref 135–145)
Total Protein: 9 g/dL — ABNORMAL HIGH (ref 6.0–8.3)

## 2012-11-07 LAB — URINALYSIS, ROUTINE W REFLEX MICROSCOPIC
Glucose, UA: NEGATIVE mg/dL
Ketones, ur: NEGATIVE mg/dL
Nitrite: NEGATIVE
Nitrite: NEGATIVE
Protein, ur: 100 mg/dL — AB
Protein, ur: NEGATIVE mg/dL
Specific Gravity, Urine: 1.021 (ref 1.005–1.030)
Urobilinogen, UA: 1 mg/dL (ref 0.0–1.0)
pH: 5.5 (ref 5.0–8.0)

## 2012-11-07 LAB — PROTIME-INR
INR: 0.98 (ref 0.00–1.49)
Prothrombin Time: 12.9 seconds (ref 11.6–15.2)

## 2012-11-07 LAB — CBC WITH DIFFERENTIAL/PLATELET
Band Neutrophils: 3 % (ref 0–10)
Eosinophils Absolute: 0.3 10*3/uL (ref 0.0–0.7)
Eosinophils Relative: 1 % (ref 0–5)
MCH: 29.2 pg (ref 26.0–34.0)
MCV: 87.7 fL (ref 78.0–100.0)
Metamyelocytes Relative: 0 %
Myelocytes: 0 %
Platelets: 421 10*3/uL — ABNORMAL HIGH (ref 150–400)
RDW: 13.7 % (ref 11.5–15.5)
nRBC: 0 /100 WBC

## 2012-11-07 LAB — URINE MICROSCOPIC-ADD ON

## 2012-11-07 LAB — LACTIC ACID, PLASMA
Lactic Acid, Venous: 1.4 mmol/L (ref 0.5–2.2)
Lactic Acid, Venous: 1.9 mmol/L (ref 0.5–2.2)

## 2012-11-07 LAB — GLUCOSE, CAPILLARY: Glucose-Capillary: 123 mg/dL — ABNORMAL HIGH (ref 70–99)

## 2012-11-07 LAB — OCCULT BLOOD X 1 CARD TO LAB, STOOL: Fecal Occult Bld: POSITIVE — AB

## 2012-11-07 LAB — APTT: aPTT: 37 seconds (ref 24–37)

## 2012-11-07 LAB — ROTAVIRUS ANTIGEN, STOOL

## 2012-11-07 LAB — CORTISOL: Cortisol, Plasma: 36.2 ug/dL

## 2012-11-07 MED ORDER — IOHEXOL 350 MG/ML SOLN
100.0000 mL | Freq: Once | INTRAVENOUS | Status: AC | PRN
  Administered 2012-11-07: 100 mL via INTRAVENOUS

## 2012-11-07 MED ORDER — DEXTROSE 5 % IV SOLN
1.0000 g | Freq: Once | INTRAVENOUS | Status: DC
  Filled 2012-11-07: qty 1

## 2012-11-07 MED ORDER — SODIUM CHLORIDE 0.9 % IV BOLUS (SEPSIS)
1000.0000 mL | Freq: Once | INTRAVENOUS | Status: AC
  Administered 2012-11-07: 1000 mL via INTRAVENOUS

## 2012-11-07 MED ORDER — ACETAMINOPHEN 160 MG/5ML PO SOLN
650.0000 mg | Freq: Four times a day (QID) | ORAL | Status: DC | PRN
  Administered 2012-11-07: 650 mg via ORAL
  Filled 2012-11-07: qty 20.3

## 2012-11-07 MED ORDER — FENTANYL CITRATE 0.05 MG/ML IJ SOLN
INTRAMUSCULAR | Status: AC
  Filled 2012-11-07: qty 2

## 2012-11-07 MED ORDER — SODIUM CHLORIDE 0.9 % IV SOLN
INTRAVENOUS | Status: AC
  Administered 2012-11-07: 75 mL via INTRAVENOUS
  Administered 2012-11-08 – 2012-11-11 (×3): via INTRAVENOUS

## 2012-11-07 MED ORDER — METRONIDAZOLE IN NACL 5-0.79 MG/ML-% IV SOLN
500.0000 mg | Freq: Four times a day (QID) | INTRAVENOUS | Status: DC
  Administered 2012-11-07 – 2012-11-10 (×11): 500 mg via INTRAVENOUS
  Filled 2012-11-07 (×14): qty 100

## 2012-11-07 MED ORDER — SODIUM CHLORIDE 0.9 % IV SOLN: 250.0000 mL | INTRAVENOUS | Status: DC | PRN

## 2012-11-07 MED ORDER — FAMOTIDINE IN NACL 20-0.9 MG/50ML-% IV SOLN
20.0000 mg | Freq: Two times a day (BID) | INTRAVENOUS | Status: DC
  Administered 2012-11-07 – 2012-11-09 (×5): 20 mg via INTRAVENOUS
  Filled 2012-11-07 (×8): qty 50

## 2012-11-07 MED ORDER — ONDANSETRON HCL 4 MG/2ML IJ SOLN
4.0000 mg | Freq: Once | INTRAMUSCULAR | Status: AC
  Administered 2012-11-07: 4 mg via INTRAVENOUS
  Filled 2012-11-07: qty 2

## 2012-11-07 MED ORDER — ONDANSETRON HCL 4 MG/2ML IJ SOLN
4.0000 mg | Freq: Four times a day (QID) | INTRAMUSCULAR | Status: DC | PRN
  Administered 2012-11-08: 4 mg via INTRAVENOUS
  Filled 2012-11-07: qty 2

## 2012-11-07 MED ORDER — CEFEPIME HCL 1 G IJ SOLR
INTRAMUSCULAR | Status: AC
  Administered 2012-11-07: 1000 mg
  Filled 2012-11-07: qty 1

## 2012-11-07 MED ORDER — VANCOMYCIN 50 MG/ML ORAL SOLUTION
500.0000 mg | Freq: Four times a day (QID) | ORAL | Status: DC
  Administered 2012-11-07 – 2012-11-08 (×3): 500 mg via ORAL
  Filled 2012-11-07 (×7): qty 10

## 2012-11-07 MED ORDER — BUDESONIDE-FORMOTEROL FUMARATE 160-4.5 MCG/ACT IN AERO
2.0000 | INHALATION_SPRAY | Freq: Two times a day (BID) | RESPIRATORY_TRACT | Status: DC
  Administered 2012-11-07 – 2012-11-08 (×2): 2 via RESPIRATORY_TRACT
  Filled 2012-11-07: qty 6

## 2012-11-07 MED ORDER — SODIUM CHLORIDE 0.9 % IV BOLUS (SEPSIS): 500.0000 mL | INTRAVENOUS | Status: DC | PRN

## 2012-11-07 MED ORDER — FENTANYL CITRATE 0.05 MG/ML IJ SOLN
50.0000 ug | INTRAMUSCULAR | Status: DC | PRN
  Administered 2012-11-07 (×2): 50 ug via INTRAVENOUS
  Administered 2012-11-08: 100 ug via INTRAVENOUS
  Administered 2012-11-08: 75 ug via INTRAVENOUS
  Administered 2012-11-08 (×12): 50 ug via INTRAVENOUS
  Administered 2012-11-08: 75 ug via INTRAVENOUS
  Administered 2012-11-09 – 2012-11-11 (×8): 100 ug via INTRAVENOUS
  Administered 2012-11-12: 50 ug via INTRAVENOUS
  Administered 2012-11-12: 100 ug via INTRAVENOUS
  Administered 2012-11-12: 50 ug via INTRAVENOUS
  Administered 2012-11-12: 100 ug via INTRAVENOUS
  Administered 2012-11-12: 50 ug via INTRAVENOUS
  Administered 2012-11-13 – 2012-11-16 (×10): 100 ug via INTRAVENOUS
  Filled 2012-11-07 (×30): qty 2

## 2012-11-07 MED ORDER — CIPROFLOXACIN IN D5W 400 MG/200ML IV SOLN
400.0000 mg | Freq: Two times a day (BID) | INTRAVENOUS | Status: DC
  Administered 2012-11-07 – 2012-11-17 (×20): 400 mg via INTRAVENOUS
  Filled 2012-11-07 (×24): qty 200

## 2012-11-07 MED ORDER — VANCOMYCIN HCL IN DEXTROSE 1-5 GM/200ML-% IV SOLN
1000.0000 mg | Freq: Once | INTRAVENOUS | Status: AC
  Administered 2012-11-07: 1000 mg via INTRAVENOUS
  Filled 2012-11-07: qty 200

## 2012-11-07 MED ORDER — SODIUM CHLORIDE 0.9 % IV BOLUS (SEPSIS)
1000.0000 mL | Freq: Once | INTRAVENOUS | Status: DC
  Administered 2012-11-07: 1000 mL via INTRAVENOUS

## 2012-11-07 NOTE — Consult Note (Signed)
[facility] Gastroenterology Consultation Note  Referring Provider:  Dr. Sung Amabile (PCCM) Primary Care Physician:  Sanda Linger, MD  Reason for Consultation:  Abdominal pain, abnormal CT scan  HPI: James [name] is a 62 y.o. male history of lung cancer (resection 2005) and COPD admitted for abdominal pain.  Patient was in static state of GI health until yesterday.  At that time, he began having lower abdominal pain.  Pain got much worse today, which prompted his presentation to the ED.  Upon arrival, he was found to be hypotensive (SBP ~ 70), hypothermic (92.1) and had leukocytosis (~ 26K).  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  Patient began having diarrhea a few hours ago.  Denies blood in his stool.  His abdominal pain is progressively worsening.  No known fevers at home.  No sick contacts, recent antibiotics, extracontinental travel.  Had colonoscopy several years ago in [place], normal per family recollection.   Past Medical History  Diagnosis Date  . Cancer of lung     s/p resection in 2005 at [facility] / [place] patient  . COPD (chronic obstructive pulmonary disease)   . Hypertension   . High cholesterol     Past Surgical History  Procedure Date  . Lung surgery     Prior to Admission medications   Medication Sig Start Date End Date Taking? Authorizing Provider  albuterol (PROVENTIL HFA;VENTOLIN HFA) 108 (90 BASE) MCG/ACT inhaler Inhale 2 puffs into the lungs every 6 (six) hours as needed.   Yes Historical Provider, MD    Current Facility-Administered Medications  Medication Dose Route Frequency Provider Last Rate Last Dose  . 0.9 %  sodium chloride infusion  250 mL Intravenous PRN Jeanella Craze, NP      . 0.9 %  sodium chloride infusion   Intravenous Continuous Jeanella Craze, NP 75 mL/hr at 11/07/12 1654 75 mL at 11/07/12 1654   . budesonide-formoterol (SYMBICORT) 160-4.5 MCG/ACT inhaler 2 puff  2 puff Inhalation BID Jeanella Craze, NP      . ceFEPIme (MAXIPIME) 1 g in dextrose 5 % 50 mL IVPB  1 g Intravenous Once Ethelda Chick, MD      . ciprofloxacin (CIPRO) IVPB 400 mg  400 mg Intravenous Q12H Jeanella Craze, NP   400 mg at 11/07/12 1711  . famotidine (PEPCID) IVPB 20 mg  20 mg Intravenous Q12H Brandi L Ollis, NP      . fentaNYL (SUBLIMAZE) 0.05 MG/ML injection           . fentaNYL (SUBLIMAZE) injection 50-100 mcg  50-100 mcg Intravenous Q2H PRN Jeanella Craze, NP      . metroNIDAZOLE (FLAGYL) IVPB 500 mg  500 mg Intravenous Q6H Jeanella Craze, NP   500 mg at 11/07/12 1710  . ondansetron (ZOFRAN) injection 4 mg  4 mg Intravenous Q6H PRN Jeanella Craze, NP      . vancomycin (VANCOCIN) 50 mg/mL oral solution 500 mg  500 mg Oral Q6H Jeanella Craze, NP        Allergies as of 11/07/2012 - Review Complete 11/07/2012  Allergen Reaction Noted  . Amlodipine besy-benazepril hcl  11/01/2009  . Meloxicam  11/01/2009  . Penicillins      No family history on file.  History   Social History  . Marital Status: Married  Spouse Name: N/A    Number of Children: N/A  . Years of Education: N/A   Occupational History  . Not on file.   Social History Main Topics  . Smoking status: Former Games developer  . Smokeless tobacco: Not on file  . Alcohol Use: No  . Drug Use: No  . Sexually Active:    Other Topics Concern  . Not on file   Social History Narrative  . No narrative on file    Review of Systems: Positive = bold Gen: Denies any fever, chills, rigors, night sweats, anorexia, fatigue, weakness, malaise, involuntary weight loss, and sleep disorder CV: Denies chest pain, angina, palpitations, syncope, orthopnea, PND, peripheral edema, and claudication. Resp: Denies dyspnea, cough (chronic), sputum, wheezing, coughing up blood. GI: Described in detail in HPI.    GU : Denies urinary burning, blood in urine, urinary  frequency, urinary hesitancy, nocturnal urination, and urinary incontinence. MS: Denies joint pain (chronic back pain) or swelling.  Denies muscle weakness, cramps, atrophy.  Derm: Denies rash, itching, oral ulcerations, hives, unhealing ulcers.  Psych: Denies depression, anxiety, memory loss, suicidal ideation, hallucinations,  and confusion. Heme: Denies bruising, bleeding, and enlarged lymph nodes. Neuro:  Denies any headaches, dizziness, paresthesias. Endo:  Denies any problems with DM, thyroid, adrenal function.  Physical Exam: Vital signs in last 24 hours: Temp:  [92.1 F (33.4 C)-97.6 F (36.4 C)] 97.6 F (36.4 C) (02/07 1700) Pulse Rate:  [81-112] 81  (02/07 1700) Resp:  [14-18] 18  (02/07 1700) BP: (66-134)/(36-83) 106/58 mmHg (02/07 1700) SpO2:  [88 %-100 %] 99 % (02/07 1700) Weight:  [91.173 kg (201 lb)-96 kg (211 lb 10.3 oz)] 96 kg (211 lb 10.3 oz) (02/07 1600) Last BM Date: 11/07/12 General:   Alert, uncomfortable and somewhat ill-appearing Head:  Normocephalic and atraumatic. Eyes:  Conjunctival edema; Sclera clear, no icterus.   Conjunctiva pink. Ears:  Normal auditory acuity. Nose:  No deformity, discharge,  or lesions. Mouth:  No deformity or lesions.  Oropharynx dry . Neck:  Supple; no masses or thyromegaly. Lungs:  Clear throughout to auscultation.   No wheezes, crackles, or rhonchi. No acute distress. Heart:  Regular rate and rhythm; no murmurs, clicks, rubs,  or gallops. Abdomen:  Moderate distention diffusely; intermittently hyperactive bowel sounds; tender throughout abdomen but worse left-sided abdomen, + voluntary guarding. No overt peritonitis  Msk:  Symmetrical without gross deformities. Normal posture. Pulses:  Normal pulses noted. Extremities:  Without clubbing or edema. Neurologic:  Alert and  oriented x4;  Diffusely weak, otherwise grossly normal neurologically. Skin:  Intact without significant lesions or rashes. Cervical Nodes:  No significant  cervical adenopathy Psych:  Alert and cooperative. Normal mood and affect.  Lab Results:  [facility] 11/07/12 1220  WBC 25.9*  HGB 16.4  HCT 49.2  PLT 421*   BMET  Basename 11/07/12 1220  NA 133*  K 4.1  CL 93*  CO2 24  GLUCOSE 163*  BUN 27*  CREATININE 1.20  CALCIUM 10.2   LFT  Basename 11/07/12 1220  PROT 9.0*  ALBUMIN 4.6  AST 26  ALT 20  ALKPHOS 299*  BILITOT 0.4  BILIDIR --  IBILI --   PT/INR  Basename 11/07/12 1220  LABPROT 12.9  INR 0.98    Studies/Results: Ct Angio Chest Pe W/cm &/or Wo Cm  11/07/2012  *RADIOLOGY REPORT*  Clinical Data:  Abdominal pain, hypotension, fever and chills.  CT ANGIOGRAPHY CHEST, ABDOMEN AND PELVIS  Technique:  Multidetector CT imaging through the chest, abdomen  and pelvis was performed using the standard protocol during bolus administration of intravenous contrast.  Multiplanar reconstructed images including MIPs were obtained and reviewed to evaluate the vascular anatomy.  Contrast: , OMNIPAQUE IOHEXOL 350 MG/ML SOLN  Comparison:   None.  CTA CHEST  Findings:  The ascending thoracic aorta is mildly dilated, measuring approximately 4.1 - 4.2 cm in greatest caliber.  This dilatation begins just above the sinotubular junction and continues to nearly the aortic arch.  There is no associated dissection. Correlation suggested with echocardiography electively to exclude significant aortic valvular disease.  Irregular and ulcerated plaque is present at the level of the aortic arch.  Proximal great vessels show no significant occlusive disease.  The descending thoracic aorta is of normal caliber.  The heart size is normal.  Scattered emphysematous bullae are present in the lungs.  Parenchymal scarring and atelectasis present bilaterally.  No focal pulmonary infiltrate, pulmonary edema or pleural fluid is identified.  No bony abnormalities.   Review of the MIP images confirms the above findings.  IMPRESSION:  1.  Mild dilatation of the  ascending thoracic aorta, measuring 4.1 - 4.2 cm in greatest caliber.  Recommend elective correlation with echocardiography to exclude significant aortic valvular disease. 2.  Atherosclerotic disease of the aorta.  No aortic dissection is identified.  CTA ABDOMEN AND PELVIS  Findings:  The abdominal aorta shows scattered atherosclerotic disease without evidence of aneurysm or dissection.  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  Mild eccentric narrowing of the proximal SMA is present of approximately 30%.  The inferior mesenteric artery is not well delineated and may be chronically occluded.  Single left renal artery and three separate left renal arteries are present.  There is some atrophy involving the posterior and medial aspect of the left kidney with appearance suggestive of old vascular insult/infarct.  This may be secondary to occlusion of a segmental renal artery branch or separate fourth renal artery in the past.  A patent right sided iliac stent is present in the common iliac artery.  No other evidence of significant occlusive disease involving the iliac arteries or common femoral arteries.  Nonvascular evaluation shows some degree of dilatation of the entire colon which contains stool and liquid.  Appearance suggests the possibility of enteritis.  No evidence of bowel obstruction or perforation.  No focal abscess is identified.  At least two large gallstones are present.  The gallbladder is not thickened or distended.  No masses, hernias or enlarged lymph nodes are identified.  Degenerative changes are present in the lumbar spine.   Review of the MIP images confirms the above findings.  IMPRESSION:  1.  Atherosclerotic disease of the abdominal aorta and visceral arteries, as above, without evidence of aortic aneurysm or dissection.  There is chronic atrophy of the posterior aspect of the left kidney having an appearance consistent with prior vascular insult.  2.  Patent right common iliac artery stent 3.  Cholelithiasis without overt CT evidence of cholecystitis 4.  Diffuse dilatation of the colon containing stool and liquid. Findings are suggestive of enteritis.  No associated abscess or small bowel dilatation.   Original Report Authenticated By: Irish Lack, M.D.    Dg Chest Port 1 View  11/07/2012  *RADIOLOGY REPORT*  Clinical Data: Hypotension, abdominal pain, chills, lethargy, history lung cancer, COPD, hypertension  PORTABLE CHEST - 1 VIEW  Comparison: Portable exam 1248 hours without priors for comparison  Findings: Normal heart size and  mediastinal contours. Aorta is tortuous. Pulmonary vascular congestion. Minimal interstitial prominence with bibasilar atelectasis greater on left. No segmental consolidation, pleural effusion or pneumothorax. Surgical clips at right apex.  IMPRESSION: Bibasilar atelectasis greater on left.   Original Report Authenticated By: Ulyses Southward, M.D.    Ct Angio Abd/pel W/ And/or W/o  11/07/2012  *RADIOLOGY REPORT*  Clinical Data:  Abdominal pain, hypotension, fever and chills.  CT ANGIOGRAPHY CHEST, ABDOMEN AND PELVIS  Technique:  Multidetector CT imaging through the chest, abdomen and pelvis was performed using the standard protocol during bolus administration of intravenous contrast.  Multiplanar reconstructed images including MIPs were obtained and reviewed to evaluate the vascular anatomy.  Contrast: , OMNIPAQUE IOHEXOL 350 MG/ML SOLN  Comparison:   None.  CTA CHEST  Findings:  The ascending thoracic aorta is mildly dilated, measuring approximately 4.1 - 4.2 cm in greatest caliber.  This dilatation begins just above the sinotubular junction and continues to nearly the aortic arch.  There is no associated dissection. Correlation suggested with echocardiography electively to exclude significant aortic valvular disease.  Irregular and ulcerated plaque is present at the level of the aortic arch.  Proximal great vessels show  no significant occlusive disease.  The descending thoracic aorta is of normal caliber.  The heart size is normal.  Scattered emphysematous bullae are present in the lungs.  Parenchymal scarring and atelectasis present bilaterally.  No focal pulmonary infiltrate, pulmonary edema or pleural fluid is identified.  No bony abnormalities.   Review of the MIP images confirms the above findings.  IMPRESSION:  1.  Mild dilatation of the ascending thoracic aorta, measuring 4.1 - 4.2 cm in greatest caliber.  Recommend elective correlation with echocardiography to exclude significant aortic valvular disease. 2.  Atherosclerotic disease of the aorta.  No aortic dissection is identified.  CTA ABDOMEN AND PELVIS  Findings:  The abdominal aorta shows scattered atherosclerotic disease without evidence of aneurysm or dissection.  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  Mild eccentric narrowing of the proximal SMA is present of approximately 30%.  The inferior mesenteric artery is not well delineated and may be chronically occluded.  Single left renal artery and three separate left renal arteries are present.  There is some atrophy involving the posterior and medial aspect of the left kidney with appearance suggestive of old vascular insult/infarct.  This may be secondary to occlusion of a segmental renal artery branch or separate fourth renal artery in the past.  A patent right sided iliac stent is present in the common iliac artery.  No other evidence of significant occlusive disease involving the iliac arteries or common femoral arteries.  Nonvascular evaluation shows some degree of dilatation of the entire colon which contains stool and liquid.  Appearance suggests the possibility of enteritis.  No evidence of bowel obstruction or perforation.  No focal abscess is identified.  At least two large gallstones are present.  The gallbladder is not thickened or distended.  No masses,  hernias or enlarged lymph nodes are identified.  Degenerative changes are present in the lumbar spine.   Review of the MIP images confirms the above findings.  IMPRESSION:  1.  Atherosclerotic disease of the abdominal aorta and visceral arteries, as above, without evidence of aortic aneurysm or dissection.  There is chronic atrophy of the posterior aspect of the left kidney having an appearance consistent with prior vascular insult. 2.  Patent right common iliac artery stent 3.  Cholelithiasis without  overt CT evidence of cholecystitis 4.  Diffuse dilatation of the colon containing stool and liquid. Findings are suggestive of enteritis.  No associated abscess or small bowel dilatation.   Original Report Authenticated By: Irish Lack, M.D.    Impression:  1.  Abdominal pain. 2.  Abnormal CT abdomen. 3.  Leukocytosis, hypotension, hypothermia. 4.  Overall constellation of symptoms worrisome for acute toxic colitis (such as from C. Diff colitis).  Acute inflammatory colitis (i.e., ulcerative colitis) is another consideration.  Doubt diverticulitis, but with more left-sided predominant tenderness this is a consideration as well.  Distribution of dilatation and edema seemingly confined to the colon argues against an acute ischemic process, though this can't be entirely excluded.  Plan:  1.  Aggressive volume repletion. 2.  Broad spectrum antibiotics. 3.  Stool studies. 4.  Close serial abdominal exams. 5.  Surgical consult today; I have discussed with Dr. Carolynne Edouard, and have asked him to let us know if he thinks expedited sigmoidoscopy would be helpful. 6.  Case discussed in detail with patient, patient's family, and with Dr. Sung Amabile.   LOS: 0 days   Donnelle Rubey M  11/07/2012, 5:18 PM

## 2012-11-07 NOTE — Consult Note (Signed)
Reason for Consult:abdominal pain Referring Physician: Dr. Junius Roads [name] is an 62 y.o. male.  HPI: The pt is a 62 yo wm who began having abdominal pain yesterday. The pain worsened overnight and today he has been having vomiting and diarrhea. He also feels chilled. He came to ER where he was found to be hypotensive and was admitted to ICU. He does not report recent abx use or any sick contacts  Past Medical History  Diagnosis Date  . Cancer of lung     s/p resection in 2005 at [facility] / [place] patient  . COPD (chronic obstructive pulmonary disease)   . Hypertension   . High cholesterol     Past Surgical History  Procedure Date  . Lung surgery     No family history on file.  Social History:  reports that he has quit smoking. He does not have any smokeless tobacco history on file. He reports that he does not drink alcohol or use illicit drugs.  Allergies:  Allergies  Allergen Reactions  . Amlodipine Besy-Benazepril Hcl     REACTION: sob  . Meloxicam     REACTION: stomach upset  . Penicillins     REACTION: Rash    Medications: I have reviewed the patient's current medications.  Results for orders placed during the hospital encounter of 11/07/12 (from the past 48 hour(s))  CBC WITH DIFFERENTIAL     Status: Abnormal   Collection Time   11/07/12 12:20 PM      Component Value Range Comment   WBC 25.9 (*) 4.0 - 10.5 K/uL    RBC 5.61  4.22 - 5.81 MIL/uL    Hemoglobin 16.4  13.0 - 17.0 g/dL    HCT 40.9  81.1 - 91.4 %    MCV 87.7  78.0 - 100.0 fL    MCH 29.2  26.0 - 34.0 pg    MCHC 33.3  30.0 - 36.0 g/dL    RDW 78.2  95.6 - 21.3 %    Platelets 421 (*) 150 - 400 K/uL    Neutrophils Relative 78 (*) 43 - 77 %    Lymphocytes Relative 15  12 - 46 %    Monocytes Relative 3  3 - 12 %    Eosinophils Relative 1  0 - 5 %    Basophils Relative 0  0 - 1 %    Band Neutrophils 3  0 - 10 %    Metamyelocytes Relative 0      Myelocytes 0      Promyelocytes Absolute 0      Blasts 0       nRBC 0  0 /100 WBC    Neutro Abs 20.9 (*) 1.7 - 7.7 K/uL    Lymphs Abs 3.9  0.7 - 4.0 K/uL    Monocytes Absolute 0.8  0.1 - 1.0 K/uL    Eosinophils Absolute 0.3  0.0 - 0.7 K/uL    Basophils Absolute 0.0  0.0 - 0.1 K/uL    Smear Review MORPHOLOGY UNREMARKABLE     COMPREHENSIVE METABOLIC PANEL     Status: Abnormal   Collection Time   11/07/12 12:20 PM      Component Value Range Comment   Sodium 133 (*) 135 - 145 mEq/L    Potassium 4.1  3.5 - 5.1 mEq/L    Chloride 93 (*) 96 - 112 mEq/L    CO2 24  19 - 32 mEq/L    Glucose, Bld 163 (*) 70 - 99  mg/dL    BUN 27 (*) 6 - 23 mg/dL    Creatinine, Ser 4.09  0.50 - 1.35 mg/dL    Calcium 81.1  8.4 - 10.5 mg/dL    Total Protein 9.0 (*) 6.0 - 8.3 g/dL    Albumin 4.6  3.5 - 5.2 g/dL    AST 26  0 - 37 U/L    ALT 20  0 - 53 U/L    Alkaline Phosphatase 299 (*) 39 - 117 U/L    Total Bilirubin 0.4  0.3 - 1.2 mg/dL    GFR calc non Af Amer 63 (*) >90 mL/min    GFR calc Af Amer 73 (*) >90 mL/min   LIPASE, BLOOD     Status: Normal   Collection Time   11/07/12 12:20 PM      Component Value Range Comment   Lipase 45  11 - 59 U/L   TROPONIN I     Status: Normal   Collection Time   11/07/12 12:20 PM      Component Value Range Comment   Troponin I <0.30  <0.30 ng/mL   PROTIME-INR     Status: Normal   Collection Time   11/07/12 12:20 PM      Component Value Range Comment   Prothrombin Time 12.9  11.6 - 15.2 seconds    INR 0.98  0.00 - 1.49   APTT     Status: Normal   Collection Time   11/07/12 12:20 PM      Component Value Range Comment   aPTT 37  24 - 37 seconds   LACTIC ACID, PLASMA     Status: Normal   Collection Time   11/07/12 12:20 PM      Component Value Range Comment   Lactic Acid, Venous 1.4  0.5 - 2.2 mmol/L   URINALYSIS, ROUTINE W REFLEX MICROSCOPIC     Status: Abnormal   Collection Time   11/07/12  1:45 PM      Component Value Range Comment   Color, Urine YELLOW  YELLOW    APPearance CLOUDY (*) CLEAR    Specific Gravity, Urine 1.021   1.005 - 1.030    pH 7.0  5.0 - 8.0    Glucose, UA NEGATIVE  NEGATIVE mg/dL    Hgb urine dipstick NEGATIVE  NEGATIVE    Bilirubin Urine NEGATIVE  NEGATIVE    Ketones, ur NEGATIVE  NEGATIVE mg/dL    Protein, ur 914 (*) NEGATIVE mg/dL    Urobilinogen, UA 1.0  0.0 - 1.0 mg/dL    Nitrite NEGATIVE  NEGATIVE    Leukocytes, UA NEGATIVE  NEGATIVE   URINE MICROSCOPIC-ADD ON     Status: Abnormal   Collection Time   11/07/12  1:45 PM      Component Value Range Comment   Squamous Epithelial / LPF RARE  RARE    WBC, UA 3-6  <3 WBC/hpf    RBC / HPF 3-6  <3 RBC/hpf    Bacteria, UA FEW (*) RARE    Urine-Other MUCOUS PRESENT     GLUCOSE, CAPILLARY     Status: Abnormal   Collection Time   11/07/12  3:58 PM      Component Value Range Comment   Glucose-Capillary 117 (*) 70 - 99 mg/dL   URINALYSIS, ROUTINE W REFLEX MICROSCOPIC     Status: Abnormal   Collection Time   11/07/12  4:06 PM      Component Value Range Comment   Color, Urine AMBER (*)  YELLOW BIOCHEMICALS MAY BE AFFECTED BY COLOR   APPearance CLEAR  CLEAR    Specific Gravity, Urine 1.031 (*) 1.005 - 1.030    pH 5.5  5.0 - 8.0    Glucose, UA NEGATIVE  NEGATIVE mg/dL    Hgb urine dipstick NEGATIVE  NEGATIVE    Bilirubin Urine SMALL (*) NEGATIVE    Ketones, ur NEGATIVE  NEGATIVE mg/dL    Protein, ur NEGATIVE  NEGATIVE mg/dL    Urobilinogen, UA 1.0  0.0 - 1.0 mg/dL    Nitrite NEGATIVE  NEGATIVE    Leukocytes, UA NEGATIVE  NEGATIVE MICROSCOPIC NOT DONE ON URINES WITH NEGATIVE PROTEIN, BLOOD, LEUKOCYTES, NITRITE, OR GLUCOSE <1000 mg/dL.  OCCULT BLOOD X 1 CARD TO LAB, STOOL     Status: Abnormal   Collection Time   11/07/12  5:25 PM      Component Value Range Comment   Fecal Occult Bld POSITIVE (*) NEGATIVE     Ct Angio Chest Pe W/cm &/or Wo Cm  11/07/2012  *RADIOLOGY REPORT*  Clinical Data:  Abdominal pain, hypotension, fever and chills.  CT ANGIOGRAPHY CHEST, ABDOMEN AND PELVIS  Technique:  Multidetector CT imaging through the chest, abdomen and  pelvis was performed using the standard protocol during bolus administration of intravenous contrast.  Multiplanar reconstructed images including MIPs were obtained and reviewed to evaluate the vascular anatomy.  Contrast: , OMNIPAQUE IOHEXOL 350 MG/ML SOLN  Comparison:   None.  CTA CHEST  Findings:  The ascending thoracic aorta is mildly dilated, measuring approximately 4.1 - 4.2 cm in greatest caliber.  This dilatation begins just above the sinotubular junction and continues to nearly the aortic arch.  There is no associated dissection. Correlation suggested with echocardiography electively to exclude significant aortic valvular disease.  Irregular and ulcerated plaque is present at the level of the aortic arch.  Proximal great vessels show no significant occlusive disease.  The descending thoracic aorta is of normal caliber.  The heart size is normal.  Scattered emphysematous bullae are present in the lungs.  Parenchymal scarring and atelectasis present bilaterally.  No focal pulmonary infiltrate, pulmonary edema or pleural fluid is identified.  No bony abnormalities.   Review of the MIP images confirms the above findings.  IMPRESSION:  1.  Mild dilatation of the ascending thoracic aorta, measuring 4.1 - 4.2 cm in greatest caliber.  Recommend elective correlation with echocardiography to exclude significant aortic valvular disease. 2.  Atherosclerotic disease of the aorta.  No aortic dissection is identified.  CTA ABDOMEN AND PELVIS  Findings:  The abdominal aorta shows scattered atherosclerotic disease without evidence of aneurysm or dissection.  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  Mild eccentric narrowing of the proximal SMA is present of approximately 30%.  The inferior mesenteric artery is not well delineated and may be chronically occluded.  Single left renal artery and three separate left renal arteries are present.  There is some atrophy involving  the posterior and medial aspect of the left kidney with appearance suggestive of old vascular insult/infarct.  This may be secondary to occlusion of a segmental renal artery branch or separate fourth renal artery in the past.  A patent right sided iliac stent is present in the common iliac artery.  No other evidence of significant occlusive disease involving the iliac arteries or common femoral arteries.  Nonvascular evaluation shows some degree of dilatation of the entire colon which contains stool and liquid.  Appearance suggests the possibility  of enteritis.  No evidence of bowel obstruction or perforation.  No focal abscess is identified.  At least two large gallstones are present.  The gallbladder is not thickened or distended.  No masses, hernias or enlarged lymph nodes are identified.  Degenerative changes are present in the lumbar spine.   Review of the MIP images confirms the above findings.  IMPRESSION:  1.  Atherosclerotic disease of the abdominal aorta and visceral arteries, as above, without evidence of aortic aneurysm or dissection.  There is chronic atrophy of the posterior aspect of the left kidney having an appearance consistent with prior vascular insult. 2.  Patent right common iliac artery stent 3.  Cholelithiasis without overt CT evidence of cholecystitis 4.  Diffuse dilatation of the colon containing stool and liquid. Findings are suggestive of enteritis.  No associated abscess or small bowel dilatation.   Original Report Authenticated By: Irish Lack, M.D.    Dg Chest Port 1 View  11/07/2012  *RADIOLOGY REPORT*  Clinical Data: Hypotension, abdominal pain, chills, lethargy, history lung cancer, COPD, hypertension  PORTABLE CHEST - 1 VIEW  Comparison: Portable exam 1248 hours without priors for comparison  Findings: Normal heart size and mediastinal contours. Aorta is tortuous. Pulmonary vascular congestion. Minimal interstitial prominence with bibasilar atelectasis greater on left. No  segmental consolidation, pleural effusion or pneumothorax. Surgical clips at right apex.  IMPRESSION: Bibasilar atelectasis greater on left.   Original Report Authenticated By: Ulyses Southward, M.D.    Ct Angio Abd/pel W/ And/or W/o  11/07/2012  *RADIOLOGY REPORT*  Clinical Data:  Abdominal pain, hypotension, fever and chills.  CT ANGIOGRAPHY CHEST, ABDOMEN AND PELVIS  Technique:  Multidetector CT imaging through the chest, abdomen and pelvis was performed using the standard protocol during bolus administration of intravenous contrast.  Multiplanar reconstructed images including MIPs were obtained and reviewed to evaluate the vascular anatomy.  Contrast: , OMNIPAQUE IOHEXOL 350 MG/ML SOLN  Comparison:   None.  CTA CHEST  Findings:  The ascending thoracic aorta is mildly dilated, measuring approximately 4.1 - 4.2 cm in greatest caliber.  This dilatation begins just above the sinotubular junction and continues to nearly the aortic arch.  There is no associated dissection. Correlation suggested with echocardiography electively to exclude significant aortic valvular disease.  Irregular and ulcerated plaque is present at the level of the aortic arch.  Proximal great vessels show no significant occlusive disease.  The descending thoracic aorta is of normal caliber.  The heart size is normal.  Scattered emphysematous bullae are present in the lungs.  Parenchymal scarring and atelectasis present bilaterally.  No focal pulmonary infiltrate, pulmonary edema or pleural fluid is identified.  No bony abnormalities.   Review of the MIP images confirms the above findings.  IMPRESSION:  1.  Mild dilatation of the ascending thoracic aorta, measuring 4.1 - 4.2 cm in greatest caliber.  Recommend elective correlation with echocardiography to exclude significant aortic valvular disease. 2.  Atherosclerotic disease of the aorta.  No aortic dissection is identified.  CTA ABDOMEN AND PELVIS  Findings:  The abdominal aorta shows scattered  atherosclerotic disease without evidence of aneurysm or dissection.  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  Mild eccentric narrowing of the proximal SMA is present of approximately 30%.  The inferior mesenteric artery is not well delineated and may be chronically occluded.  Single left renal artery and three separate left renal arteries are present.  There is some atrophy involving the posterior and  medial aspect of the left kidney with appearance suggestive of old vascular insult/infarct.  This may be secondary to occlusion of a segmental renal artery branch or separate fourth renal artery in the past.  A patent right sided iliac stent is present in the common iliac artery.  No other evidence of significant occlusive disease involving the iliac arteries or common femoral arteries.  Nonvascular evaluation shows some degree of dilatation of the entire colon which contains stool and liquid.  Appearance suggests the possibility of enteritis.  No evidence of bowel obstruction or perforation.  No focal abscess is identified.  At least two large gallstones are present.  The gallbladder is not thickened or distended.  No masses, hernias or enlarged lymph nodes are identified.  Degenerative changes are present in the lumbar spine.   Review of the MIP images confirms the above findings.  IMPRESSION:  1.  Atherosclerotic disease of the abdominal aorta and visceral arteries, as above, without evidence of aortic aneurysm or dissection.  There is chronic atrophy of the posterior aspect of the left kidney having an appearance consistent with prior vascular insult. 2.  Patent right common iliac artery stent 3.  Cholelithiasis without overt CT evidence of cholecystitis 4.  Diffuse dilatation of the colon containing stool and liquid. Findings are suggestive of enteritis.  No associated abscess or small bowel dilatation.   Original Report Authenticated By: Irish Lack, M.D.      Review of Systems  Constitutional: Positive for fever and chills.  HENT: Negative.   Eyes: Negative.   Respiratory: Negative.   Cardiovascular: Negative.   Gastrointestinal: Positive for nausea, vomiting, abdominal pain and diarrhea.  Genitourinary: Negative.   Musculoskeletal: Negative.   Skin: Negative.   Neurological: Positive for weakness.  Endo/Heme/Allergies: Negative.   Psychiatric/Behavioral: Negative.    Blood pressure 106/58, pulse 81, temperature 97.6 F (36.4 C), temperature source Core (Comment), resp. rate 18, height 6\' 2"  (1.88 m), weight 211 lb 10.3 oz (96 kg), SpO2 99.00%. Physical Exam  Constitutional: He is oriented to person, place, and time. He appears well-developed and well-nourished.  HENT:  Head: Normocephalic and atraumatic.  Eyes: Conjunctivae normal and EOM are normal. Pupils are equal, round, and reactive to light.  Neck: Normal range of motion. Neck supple.  Cardiovascular: Normal rate, regular rhythm and normal heart sounds.   Respiratory: Effort normal and breath sounds normal.  GI: He exhibits distension.       Moderate diffuse tenderness. There are bowel sounds present  Musculoskeletal: Normal range of motion.  Neurological: He is alert and oriented to person, place, and time.  Skin: Skin is warm and dry.  Psychiatric: He has a normal mood and affect. His behavior is normal.    Assessment/Plan: The pt's CT shows no free air or fluid. There is dilated liquid filled colon diffusely raising the question of enteritis. He is responding to resuscitation and is not currently tachycardic or febrile. At this point I would agree with bowel rest and abx therapy to include c diff. We will follow closely with you but at the moment he does not appear to need acute surgical intervention.  TOTH III,PAUL S 11/07/2012, 6:02 PM

## 2012-11-07 NOTE — Progress Notes (Signed)
ANTIBIOTIC CONSULT NOTE - INITIAL  Pharmacy Consult for PO Vanc, IV Cipro and Flagyl Indication: Enteritis, r/o Cdiff  Allergies  Allergen Reactions  . Amlodipine Besy-Benazepril Hcl     REACTION: sob  . Meloxicam     REACTION: stomach upset  . Penicillins     REACTION: Rash    Patient Measurements: Height: 6\' 2"  (188 cm) Weight: 211 lb 10.3 oz (96 kg) IBW/kg (Calculated) : 82.2   Vital Signs: Temp: 97.4 F (36.3 C) (02/07 1645) Temp src: Core (Comment) (02/07 1600) BP: 93/58 mmHg (02/07 1645) Pulse Rate: 81  (02/07 1645) Intake/Output from previous day:   Intake/Output from this shift: Total I/O In: 2000 [I.V.:2000] Out: 30 [Urine:30]  Labs:  [facility] 11/07/12 1220  WBC 25.9*  HGB 16.4  PLT 421*  LABCREA --  CREATININE 1.20   Estimated Creatinine Clearance: 73.3 ml/min (by C-G formula based on Cr of 1.2). No results found for this basename: VANCOTROUGH:2,VANCOPEAK:2,VANCORANDOM:2,GENTTROUGH:2,GENTPEAK:2,GENTRANDOM:2,TOBRATROUGH:2,TOBRAPEAK:2,TOBRARND:2,AMIKACINPEAK:2,AMIKACINTROU:2,AMIKACIN:2, in the last 72 hours   Microbiology: No results found for this or any previous visit (from the past 720 hour(s)).  Medical History: Past Medical History  Diagnosis Date  . Cancer of lung     s/p resection in 2005 at [facility] / [place] patient  . COPD (chronic obstructive pulmonary disease)   . Hypertension   . High cholesterol     Medications:  Prescriptions prior to admission  Medication Sig Dispense Refill  . albuterol (PROVENTIL HFA;VENTOLIN HFA) 108 (90 BASE) MCG/ACT inhaler Inhale 2 puffs into the lungs every 6 (six) hours as needed.       Assessment: [name] is a 62 yo admitted with hypotension and abdominal pain. His abd pain has been ongoing for 3 days and he had had multiple episodes of emeses, no diarrhea. He was ruled out for aortic dissection, but abdominal CT done in the ED showed diffuse dilatation of the colon containing stool and liquid, findings  suggestive of enteritis. No associated abscess or small bowel dilatation were found. Patient is afebrile on presentation, but has leukocytosis (25.9). Scr is normal (1.2), based on this information and hypotension, he falls in the category of severe, complicated c.diff infection if that is indeed what is causing his enteritis. Lactate on presentation is nml.  Goal of Therapy:  Complete Cdiff treatment if PCR positive  Plan:  - Ciprofloxacin 400mg  IV q 12h as ordered - Vancomycin 500mg  PO q6h as ordered, would give atleast 14- day course - Will leave Flagyl 500mg  IV q 6h as ordered, but this could be decreased to q8h as patient's status improves. - Will monitor cx/spec/sens, renal fn and clinical status daily.  Thanks, Karlee Staff K. Allena Katz, PharmD, BCPS.  Clinical Pharmacist Pager [phone]. 11/07/2012 5:11 PM

## 2012-11-07 NOTE — ED Provider Notes (Signed)
History     CSN: [id number]  Arrival date & time 11/07/12  1205   First MD Initiated Contact with Patient 11/07/12 1208      Chief Complaint  Patient presents with  . Hypotension  . Abdominal Pain    (Consider location/radiation/quality/duration/timing/severity/associated sxs/prior treatment) HPI A LEVEL 5 CAVEAT PERTAINS DUE TO URGENT NEED FOR INTERVENTION AND ALTERED MENTAL STATUS.  Pt brought in by EMS with hypotension and vomiting with abdominal pain.  Pt states his abdomen has been hurting x 3 days, then today became more severe and he has had multiple episodes of emesis.  He denies diarrhea.  No chest pain.  Has had chills and decreased energy level.  Pt given zofran en route via EMS.    Past Medical History  Diagnosis Date  . Cancer of lung   . COPD (chronic obstructive pulmonary disease)   . Hypertension   . High cholesterol     Past Surgical History  Procedure Date  . Lung surgery     No family history on file.  History  Substance Use Topics  . Smoking status: Former Games developer  . Smokeless tobacco: Not on file  . Alcohol Use: No      Review of Systems UNABLE TO OBTAIN ROS DUE TO LEVEL 5 CAVEAT  Allergies  Ace inhibitors; Amlodipine besy-benazepril hcl; Meloxicam; and Penicillins  Home Medications   No current outpatient prescriptions on file.  BP 103/50  Pulse 98  Temp 95.2 F (35.1 C) (Other (Comment))  Resp 15  Ht 6\' 3"  (1.905 m)  Wt 201 lb (91.173 kg)  BMI 25.12 kg/m2  SpO2 98% Vitals reviewed Physical Exam Physical Examination: General appearance - alert, well appearing, and in no distress Mental status - alert, oriented to person, place, and time Eyes - pupils equal and reactive, extraocular eye movements intact, no scleral icterus Mouth - mucous membranes moist, pharynx normal without lesions Chest - clear to auscultation, no wheezes, rales or rhonchi, symmetric air entry Heart - normal rate, regular rhythm, normal S1, S2, no murmurs,  rubs, clicks or gallops Abdomen - soft, mild distension, diffusely tender to palpation- especially in bilateral lower abdomen, nabs, no masses or organomegaly GU Male - no penile lesions or discharge, no testicular masses or tenderness, no hernias Neurological - alert, answering questions, but slow to respond, not answering orientation questions, moving extremities x 4 Extremities - peripheral pulses normal, no pedal edema, no clubbing or cyanosis, cool to touch Skin - normal coloration and turgor, no rashes  ED Course  Procedures (including critical care time)   Date: 11/07/2012  Rate: 101  Rhythm: atrial fibrillation  QRS Axis: normal  Intervals: indeterminate  ST/T Wave abnormalities: nonspecific ST/T changes  Conduction Disutrbances:incomplete left bundle branch block  Narrative Interpretation:   Old EKG Reviewed: none available  CRITICAL CARE Performed by: Ethelda Chick   Total critical care time: 120  Critical care time was exclusive of separately billable procedures and treating other patients.  Critical care was necessary to treat or prevent imminent or life-threatening deterioration.  Critical care was time spent personally by me on the following activities: development of treatment plan with patient and/or surrogate as well as nursing, discussions with consultants, evaluation of patient's response to treatment, examination of patient, obtaining history from patient or surrogate, ordering and performing treatments and interventions, ordering and review of laboratory studies, ordering and review of radiographic studies, pulse oximetry and re-evaluation of patient's condition.  1:11 PM pt is currently in CT scanner.  I accompanied patient to the scanner and viewed the images in real time. Pt with abdominal pain, vomiting, hypotensive and hypothermic.  Pt with 2 IVs- NS running wide open.  O2 supplied.   2:07 PM CT scan shows enteritis, no evidence of aortic dissection.  Pt  receiving cefepime now.  BP is responsive to fluids, pt is on 3rd liter now.  D/w Dr. Maylon Cos, PCCM- he has accepted patient to ICU.  We are continuing to monitor closely.    Labs Reviewed  CBC WITH DIFFERENTIAL - Abnormal; Notable for the following:    WBC 25.9 (*)     Platelets 421 (*)     Neutrophils Relative 78 (*)     Neutro Abs 20.9 (*)     All other components within normal limits  COMPREHENSIVE METABOLIC PANEL - Abnormal; Notable for the following:    Sodium 133 (*)     Chloride 93 (*)     Glucose, Bld 163 (*)     BUN 27 (*)     Total Protein 9.0 (*)     Alkaline Phosphatase 299 (*)     GFR calc non Af Amer 63 (*)     GFR calc Af Amer 73 (*)     All other components within normal limits  URINALYSIS, ROUTINE W REFLEX MICROSCOPIC - Abnormal; Notable for the following:    APPearance CLOUDY (*)     Protein, ur 100 (*)     All other components within normal limits  URINE MICROSCOPIC-ADD ON - Abnormal; Notable for the following:    Bacteria, UA FEW (*)     All other components within normal limits  LIPASE, BLOOD  TROPONIN I  PROTIME-INR  APTT  LACTIC ACID, PLASMA  CULTURE, BLOOD (ROUTINE X 2)  CULTURE, BLOOD (ROUTINE X 2)  URINE CULTURE   Ct Angio Chest Pe W/cm &/or Wo Cm  11/07/2012  *RADIOLOGY REPORT*  Clinical Data:  Abdominal pain, hypotension, fever and chills.  CT ANGIOGRAPHY CHEST, ABDOMEN AND PELVIS  Technique:  Multidetector CT imaging through the chest, abdomen and pelvis was performed using the standard protocol during bolus administration of intravenous contrast.  Multiplanar reconstructed images including MIPs were obtained and reviewed to evaluate the vascular anatomy.  Contrast: , OMNIPAQUE IOHEXOL 350 MG/ML SOLN  Comparison:   None.  CTA CHEST  Findings:  The ascending thoracic aorta is mildly dilated, measuring approximately 4.1 - 4.2 cm in greatest caliber.  This dilatation begins just above the sinotubular junction and continues to nearly the aortic arch.   There is no associated dissection. Correlation suggested with echocardiography electively to exclude significant aortic valvular disease.  Irregular and ulcerated plaque is present at the level of the aortic arch.  Proximal great vessels show no significant occlusive disease.  The descending thoracic aorta is of normal caliber.  The heart size is normal.  Scattered emphysematous bullae are present in the lungs.  Parenchymal scarring and atelectasis present bilaterally.  No focal pulmonary infiltrate, pulmonary edema or pleural fluid is identified.  No bony abnormalities.   Review of the MIP images confirms the above findings.  IMPRESSION:  1.  Mild dilatation of the ascending thoracic aorta, measuring 4.1 - 4.2 cm in greatest caliber.  Recommend elective correlation with echocardiography to exclude significant aortic valvular disease. 2.  Atherosclerotic disease of the aorta.  No aortic dissection is identified.  CTA ABDOMEN AND PELVIS  Findings:  The abdominal aorta shows scattered atherosclerotic disease without evidence of aneurysm or dissection.  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  Mild eccentric narrowing of the proximal SMA is present of approximately 30%.  The inferior mesenteric artery is not well delineated and may be chronically occluded.  Single left renal artery and three separate left renal arteries are present.  There is some atrophy involving the posterior and medial aspect of the left kidney with appearance suggestive of old vascular insult/infarct.  This may be secondary to occlusion of a segmental renal artery branch or separate fourth renal artery in the past.  A patent right sided iliac stent is present in the common iliac artery.  No other evidence of significant occlusive disease involving the iliac arteries or common femoral arteries.  Nonvascular evaluation shows some degree of dilatation of the entire colon which contains stool and liquid.   Appearance suggests the possibility of enteritis.  No evidence of bowel obstruction or perforation.  No focal abscess is identified.  At least two large gallstones are present.  The gallbladder is not thickened or distended.  No masses, hernias or enlarged lymph nodes are identified.  Degenerative changes are present in the lumbar spine.   Review of the MIP images confirms the above findings.  IMPRESSION:  1.  Atherosclerotic disease of the abdominal aorta and visceral arteries, as above, without evidence of aortic aneurysm or dissection.  There is chronic atrophy of the posterior aspect of the left kidney having an appearance consistent with prior vascular insult. 2.  Patent right common iliac artery stent 3.  Cholelithiasis without overt CT evidence of cholecystitis 4.  Diffuse dilatation of the colon containing stool and liquid. Findings are suggestive of enteritis.  No associated abscess or small bowel dilatation.   Original Report Authenticated By: Irish Lack, M.D.    Dg Chest Port 1 View  11/07/2012  *RADIOLOGY REPORT*  Clinical Data: Hypotension, abdominal pain, chills, lethargy, history lung cancer, COPD, hypertension  PORTABLE CHEST - 1 VIEW  Comparison: Portable exam 1248 hours without priors for comparison  Findings: Normal heart size and mediastinal contours. Aorta is tortuous. Pulmonary vascular congestion. Minimal interstitial prominence with bibasilar atelectasis greater on left. No segmental consolidation, pleural effusion or pneumothorax. Surgical clips at right apex.  IMPRESSION: Bibasilar atelectasis greater on left.   Original Report Authenticated By: Ulyses Southward, M.D.    Ct Angio Abd/pel W/ And/or W/o  11/07/2012  *RADIOLOGY REPORT*  Clinical Data:  Abdominal pain, hypotension, fever and chills.  CT ANGIOGRAPHY CHEST, ABDOMEN AND PELVIS  Technique:  Multidetector CT imaging through the chest, abdomen and pelvis was performed using the standard protocol during bolus administration of  intravenous contrast.  Multiplanar reconstructed images including MIPs were obtained and reviewed to evaluate the vascular anatomy.  Contrast: , OMNIPAQUE IOHEXOL 350 MG/ML SOLN  Comparison:   None.  CTA CHEST  Findings:  The ascending thoracic aorta is mildly dilated, measuring approximately 4.1 - 4.2 cm in greatest caliber.  This dilatation begins just above the sinotubular junction and continues to nearly the aortic arch.  There is no associated dissection. Correlation suggested with echocardiography electively to exclude significant aortic valvular disease.  Irregular and ulcerated plaque is present at the level of the aortic arch.  Proximal great vessels show no significant occlusive disease.  The descending thoracic aorta is of normal caliber.  The heart size is normal.  Scattered emphysematous bullae are present in the lungs.  Parenchymal scarring and atelectasis present bilaterally.  No focal pulmonary infiltrate, pulmonary edema or  pleural fluid is identified.  No bony abnormalities.   Review of the MIP images confirms the above findings.  IMPRESSION:  1.  Mild dilatation of the ascending thoracic aorta, measuring 4.1 - 4.2 cm in greatest caliber.  Recommend elective correlation with echocardiography to exclude significant aortic valvular disease. 2.  Atherosclerotic disease of the aorta.  No aortic dissection is identified.  CTA ABDOMEN AND PELVIS  Findings:  The abdominal aorta shows scattered atherosclerotic disease without evidence of aneurysm or dissection.  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  Mild eccentric narrowing of the proximal SMA is present of approximately 30%.  The inferior mesenteric artery is not well delineated and may be chronically occluded.  Single left renal artery and three separate left renal arteries are present.  There is some atrophy involving the posterior and medial aspect of the left kidney with appearance suggestive of  old vascular insult/infarct.  This may be secondary to occlusion of a segmental renal artery branch or separate fourth renal artery in the past.  A patent right sided iliac stent is present in the common iliac artery.  No other evidence of significant occlusive disease involving the iliac arteries or common femoral arteries.  Nonvascular evaluation shows some degree of dilatation of the entire colon which contains stool and liquid.  Appearance suggests the possibility of enteritis.  No evidence of bowel obstruction or perforation.  No focal abscess is identified.  At least two large gallstones are present.  The gallbladder is not thickened or distended.  No masses, hernias or enlarged lymph nodes are identified.  Degenerative changes are present in the lumbar spine.   Review of the MIP images confirms the above findings.  IMPRESSION:  1.  Atherosclerotic disease of the abdominal aorta and visceral arteries, as above, without evidence of aortic aneurysm or dissection.  There is chronic atrophy of the posterior aspect of the left kidney having an appearance consistent with prior vascular insult. 2.  Patent right common iliac artery stent 3.  Cholelithiasis without overt CT evidence of cholecystitis 4.  Diffuse dilatation of the colon containing stool and liquid. Findings are suggestive of enteritis.  No associated abscess or small bowel dilatation.   Original Report Authenticated By: Irish Lack, M.D.      1. Hypotension   2. Hypothermia   3. Vomiting   4. Abdominal pain   5. Leukocytosis       MDM  Pt presenting via EMS with hypotension, vomiting and abdominal pain.  Pt seen immediately upon arrival.  Pt placed on monitor, 2 IVs started, NS bolus, EKG, labs including blood cultures obtained.  Rectal temp 92.  Bear hugger applied and temp foley placed.  O2 per [place] applied.  Pt with hx of COPDO and uses 2L [place] at home per wife.  Lungs clear, no wheezing.  Abdomen is tender diffusely.  Portable CXR  obtained.  Pt taken emergently to CT for dissection protocol CT.  This did not show aortic dissection, but findings more c/w enteritis.  Pt with leukocytosis, lactate normal.  BP does improve with fluid bolus.  Pt received 4L NS bolus while in the ED.  Started on vanc and cefepime (not zosyn due to PCN allergy).  Pt given 2 doses of zofran for vomiting- emesis improved but patient still with n/v in ED.  Pt asking for pain meds but will hold on these as his BP is tenuous.  Updated patient and family at the  bedside about all findings and plan for transfer to ICU at [facility].  [facility] came to transport patient in gaurded condition.  Dr. Maylon Cos accepting.  Xray images all reviewed by me        Ethelda Chick, MD 11/07/12 [phone]

## 2012-11-07 NOTE — Procedures (Signed)
PROCEDURE NOTE: R IJ CVL PLACEMENT  INDICATION:    Monitoring of central venous pressures and/or administration of medications optimally administered in central vein  CONSENT:   Risks of procedure as well as the alternatives were explained to the patient or surrogate. Consent for procedure obtained. A time out was performed to review patient identification, procedure to be performed, correct patient position, medications/allergies/relevent history, required imaging and test results.  PROCEDURE  Maximum sterile technique was used including antiseptics, cap, gloves, gown, hand hygiene, mask and sheet.  Skin prep: Chlorhexidine; local anesthetic administered  A antimicrobial bonded/coated triple lumen catheter was placed in the R IJ vein using the Seldinger technique.  Ultrasound was used for vessel identification and guidance.   EVALUATION:  Blood flow good  Complications: No apparent complications  Patient tolerated the procedure well.  Chest X-ray ordered to verify placement and is pending   Procedure performed by NP Canary Brim under my direct supervision  Billy Fischer, MD PCCM service Mobile [phone]

## 2012-11-07 NOTE — Progress Notes (Signed)
eLink Physician-Brief Progress Note Patient Name: [name] DOB: Oct 29, 1950 MRN: [id number]  Date of Service  11/07/2012   HPI/Events of Note  CVP of 3 in the setting of sepsis/fever and h/o of hypertension.  Current BP of 119/64 (78).  Has temp of 102 and is tachycardiac.  UOP has outpaced intake so patient is net negative.   eICU Interventions  Plan: 1 liter of NS additional fluid over 2 hours. Continue to monitor UOP/VS   Intervention Category Intermediate Interventions: Other: (low CVP)  DETERDING,ELIZABETH 11/07/2012, 11:22 PM

## 2012-11-07 NOTE — H&P (Signed)
PULMONARY  / CRITICAL CARE MEDICINE  Name: James [name] MRN: [id number] DOB: 01-17-1951    ADMISSION DATE:  11/07/2012  REFERRING MD :  EDP  CHIEF COMPLAINT:  ABD Pain, nausea, vomiting, diarrhea  BRIEF PATIENT DESCRIPTION:  62 y/o WM with PMH of COPD, Lung cancer s/p resection at [facility] who presented to [facility] with acute onset abdominal pain with associated n/v/d.  Tx to [facility] ICU for further eval.    SIGNIFICANT EVENTS / STUDIES:  2/7 - CT ABD / Pelvis>>>Atherosclerotic disease of the abdominal aorta and visceral arteries, as above, without evidence of aortic aneurysm or dissection. There is chronic atrophy of the posterior aspect of the left kidney having an appearance consistent with prior vascular insult. Patent right common iliac artery stent  Cholelithiasis without overt CT evidence of cholecystitis  Diffuse dilatation of the colon containing stool and liquid. Findings are suggestive of enteritis. No associated abscess or small bowel dilatation.   LINES / TUBES: R IJ TLC 2/7>>>  CULTURES: BCx2 2/7>>> Stool Culture>>> Cdiff PCR>>>  ANTIBIOTICS: Flagyl 2/7>>> Cipro 2/7>>> Vanco PO 2/7>>>  HISTORY OF PRESENT ILLNESS:  62 y/o WM with PMH of COPD, Lung cancer s/p resection at [facility] who presented to [facility] 2/7 with acute onset abdominal pain with associated n/v/d.  Was reportedly "fine" am of admit, at breakfast and then had abrupt onset abdominal pain with n/v/d.   To [facility] for eval which demonstrated hypothermia, hypotension, and CT concerning for enteritis. Also noted to have lip and eyelid swelling.  Is on lisinopril / HCTZ for HTN at baseline but has had cough in past associated with use.  Denies chest pain, shortness of breath, swelling of extremities, pain with inspiration, syncope, headache.     Tx to [facility] ICU for further eval.    PAST MEDICAL HISTORY :  Past Medical History  Diagnosis Date  . Cancer of lung   . COPD (chronic obstructive pulmonary  disease)   . Hypertension   . High cholesterol    Past Surgical History  Procedure Date  . Lung surgery    Prior to Admission medications   Medication Sig Start Date End Date Taking? Authorizing Provider  albuterol (PROVENTIL HFA;VENTOLIN HFA) 108 (90 BASE) MCG/ACT inhaler Inhale 2 puffs into the lungs every 6 (six) hours as needed.   Yes Historical Provider, MD   Allergies  Allergen Reactions  . Ace Inhibitors     REACTION: cough  . Amlodipine Besy-Benazepril Hcl     REACTION: sob  . Meloxicam     REACTION: stomach upset  . Penicillins     REACTION: Rash    FAMILY HISTORY:  No family history on file. SOCIAL HISTORY:  reports that he has quit smoking. He does not have any smokeless tobacco history on file. He reports that he does not drink alcohol or use illicit drugs.  REVIEW OF SYSTEMS:   + Abd pain, Left lower + nausea, vomiting, diarrhea + incontinent of bowel   SUBJECTIVE:  See HPI  VITAL SIGNS: Temp:  [92.1 F (33.4 C)-95.2 F (35.1 C)] 95.2 F (35.1 C) (02/07 1442) Pulse Rate:  [92-112] 98  (02/07 1442) Resp:  [14-16] 15  (02/07 1442) BP: (66-134)/(36-83) 103/50 mmHg (02/07 1442) SpO2:  [88 %-100 %] 98 % (02/07 1442) Weight:  [201 lb (91.173 kg)] 201 lb (91.173 kg) (02/07 1209)  HEMODYNAMICS:    INTAKE / OUTPUT: Intake/Output      02/06 0701 - 02/07  0700 02/07 0701 - 02/08 0700   I.V. (mL/kg)  2000 (21.9)   Total Intake(mL/kg)  2000 (21.9)   Urine (mL/kg/hr)  30 (0)   Total Output  30   Net  +1970          PHYSICAL EXAMINATION: General:  Well developed male who appears miserable / hurting Neuro:  Awake, alert, oriented, cooperative HEENT:  Pupils =R, mm pink/moist, no jvd Cardiovascular:  s1s2 rrr, no m/r/g, SR on monitor Lungs:  Lungs bilaterally clear Abdomen:  Obese/soft, very tender to palpation, specifically LLQ Musculoskeletal:  No acute deformities Skin:  Warm/dry, no edema in LE.  Noted lip / eye edema, no tongue  swelling  LABS:  Lab 11/07/12 1220  HGB 16.4  WBC 25.9*  PLT 421*  NA 133*  K 4.1  CL 93*  CO2 24  GLUCOSE 163*  BUN 27*  CREATININE 1.20  CALCIUM 10.2  MG --  PHOS --  AST 26  ALT 20  ALKPHOS 299*  BILITOT 0.4  PROT 9.0*  ALBUMIN 4.6  APTT 37  INR 0.98  LATICACIDVEN 1.4  TROPONINI <0.30  PROCALCITON --  PROBNP --  O2SATVEN --  PHART --  PCO2ART --  PO2ART --   No results found for this basename: GLUCAP:5 in the last 168 hours  CXR: 2/7 mild atx L>R  Problem List:  Hypovolemic shock due to profound diarrhea/vomiting/SIRS  Severe abdominal pain  Enteritis by CT abd 2/07  SIRS criteria on admission (leukocytosis, hypotension, hypothermia)  Swollen upper lip, unclear etiology  Paroxysmal a-fib, spont conversion to NSR on admission  Hyperglycemia, stress related  Elevated alkaline phosphatase level  History of hypertension  H/O COPD   H/O hyperlipidemia  ASSESSMENT / PLAN:  PULMONARY A: Lower lobe atelectasis - in setting of pain / abdominal splinting COPD - on symbicort at baseline  P:   -monitor -continue symbicort  CARDIOVASCULAR A:  Hypovolemic Shock - in setting of nausea / vomiting / diarrhea.  Received 7L NS at [facility].   Swollen upper lip - eyelid and lip edema, no tongue involvement, unclear etiology Atrial Fibrillation  - noted in [facility] & resolved on presentation to [facility]  P:  -hold home anti-hypertensive (lisinopril / HCTZ).  Has had cough in past associated with ACE inhibitor use -place central line, assess CVP Q4 -may require NEO for MAP >65 -NS at 75 -repeat EKG, appears to be SR on arrival to [facility]   RENAL A:   Hyponatremia Hypochloremia  P:   -NS resuscitation   GASTROINTESTINAL A:   Nausea / Vomiting Acute Abdominal Pain - DDx includes ischemic vs infectious colitis, diverticulitis.  seems out of proportion to CT findings Elevated Alk Phos    P:   -see ID section -PRN Zofran -f/u LFT's, Alk Phos in  am -GI Consult:  Appreciate Dr. Hulen Shouts input  HEMATOLOGIC A:   No acute Abnormalities   P:  -monitor H/H -SCD's -Hold heparin until eval by GI   INFECTIOUS A:   N/V/D with associated Acute ABD Pain  P:   -abx as above -stool culture -stool for c-diff  ENDOCRINE A:   Hyperglycemia - noted on initial BMP  P:   -monitor BMP glucose  NEUROLOGIC A:   Pain   P:   -PRN Fentanyl   I have personally obtained a history, examined the patient, evaluated laboratory and imaging results, formulated the assessment and plan and placed orders.  CRITICAL CARE: The patient is critically ill  with multiple organ systems failure and requires high complexity decision making for assessment and support, frequent evaluation and titration of therapies, application of advanced monitoring technologies and extensive interpretation of multiple databases. Critical Care Time devoted to patient care services described in this note is 45 minutes.    Billy Fischer, MD ; [facility] [phone].  After 5:30 PM or weekends, call [phone]  11/07/2012, 4:06 PM

## 2012-11-07 NOTE — ED Notes (Signed)
Pt brought to room 14 via [facility] for hypotension, abdominal pain x 2 days, chills, lethargy. 20G to left forearm, NS infusing, 4mg  Zofran given pta. BP en route 78/42

## 2012-11-08 ENCOUNTER — Encounter (HOSPITAL_COMMUNITY)
Admission: EM | Disposition: A | Discharge status: Home / Self Care | Payer: Self-pay | Source: Home / Self Care | Attending: Pulmonary Disease

## 2012-11-08 ENCOUNTER — Inpatient Hospital Stay (HOSPITAL_COMMUNITY): Payer: BC Managed Care – PPO

## 2012-11-08 ENCOUNTER — Encounter (HOSPITAL_COMMUNITY): Payer: Self-pay | Admitting: Anesthesiology

## 2012-11-08 ENCOUNTER — Inpatient Hospital Stay (HOSPITAL_COMMUNITY): Payer: BC Managed Care – PPO | Admitting: Anesthesiology

## 2012-11-08 DIAGNOSIS — S31109A Unspecified open wound of abdominal wall, unspecified quadrant without penetration into peritoneal cavity, initial encounter: Secondary | ICD-10-CM

## 2012-11-08 DIAGNOSIS — J449 Chronic obstructive pulmonary disease, unspecified: Secondary | ICD-10-CM

## 2012-11-08 DIAGNOSIS — K55059 Acute (reversible) ischemia of intestine, part and extent unspecified: Secondary | ICD-10-CM

## 2012-11-08 DIAGNOSIS — I959 Hypotension, unspecified: Secondary | ICD-10-CM

## 2012-11-08 HISTORY — PX: LAPAROTOMY: SHX154

## 2012-11-08 HISTORY — PX: COLOSTOMY: SHX63

## 2012-11-08 LAB — CBC WITH DIFFERENTIAL/PLATELET
Eosinophils Absolute: 0 10*3/uL (ref 0.0–0.7)
Hemoglobin: 9.7 g/dL — ABNORMAL LOW (ref 13.0–17.0)
Lymphs Abs: 0.8 10*3/uL (ref 0.7–4.0)
MCH: 29.8 pg (ref 26.0–34.0)
Monocytes Relative: 5 % (ref 3–12)
Neutrophils Relative %: 89 % — ABNORMAL HIGH (ref 43–77)
RBC: 3.25 MIL/uL — ABNORMAL LOW (ref 4.22–5.81)

## 2012-11-08 LAB — CBC
Platelets: 290 10*3/uL (ref 150–400)
RBC: 3.89 MIL/uL — ABNORMAL LOW (ref 4.22–5.81)
RDW: 14 % (ref 11.5–15.5)
WBC: 18.3 10*3/uL — ABNORMAL HIGH (ref 4.0–10.5)

## 2012-11-08 LAB — URINE CULTURE
Colony Count: NO GROWTH
Culture: NO GROWTH

## 2012-11-08 LAB — BASIC METABOLIC PANEL
CO2: 20 mEq/L (ref 19–32)
Chloride: 107 mEq/L (ref 96–112)
GFR calc Af Amer: 74 mL/min — ABNORMAL LOW (ref >90)
Sodium: 138 mEq/L (ref 135–145)

## 2012-11-08 LAB — HEPATIC FUNCTION PANEL
Alkaline Phosphatase: 218 U/L — ABNORMAL HIGH (ref 39–117)
Bilirubin, Direct: 0.2 mg/dL (ref 0.0–0.3)
Indirect Bilirubin: 0.3 mg/dL (ref 0.3–0.9)
Total Bilirubin: 0.5 mg/dL (ref 0.3–1.2)

## 2012-11-08 LAB — COMPREHENSIVE METABOLIC PANEL
AST: 19 U/L (ref 0–37)
Albumin: 2.4 g/dL — ABNORMAL LOW (ref 3.5–5.2)
CO2: 25 mEq/L (ref 19–32)
Calcium: 7.5 mg/dL — ABNORMAL LOW (ref 8.4–10.5)
Creatinine, Ser: 1.13 mg/dL (ref 0.50–1.35)
GFR calc non Af Amer: 67 mL/min — ABNORMAL LOW (ref >90)
Total Protein: 5 g/dL — ABNORMAL LOW (ref 6.0–8.3)

## 2012-11-08 LAB — CLOSTRIDIUM DIFFICILE BY PCR: Toxigenic C. Difficile by PCR: NEGATIVE

## 2012-11-08 LAB — POCT I-STAT 4, (NA,K, GLUC, HGB,HCT)
Glucose, Bld: 138 mg/dL — ABNORMAL HIGH (ref 70–99)
HCT: 34 % — ABNORMAL LOW (ref 39.0–52.0)
Hemoglobin: 11.6 g/dL — ABNORMAL LOW (ref 13.0–17.0)
Potassium: 3.6 mEq/L (ref 3.5–5.1)
Sodium: 139 mEq/L (ref 135–145)

## 2012-11-08 SURGERY — LAPAROTOMY, EXPLORATORY
Anesthesia: General | Site: Abdomen | Wound class: Clean Contaminated

## 2012-11-08 MED ORDER — OXYCODONE HCL 5 MG/5ML PO SOLN: 5.0000 mg | Freq: Once | ORAL | Status: DC | PRN

## 2012-11-08 MED ORDER — LIDOCAINE HCL (CARDIAC) 20 MG/ML IV SOLN
INTRAVENOUS | Status: DC | PRN
  Administered 2012-11-08: 100 mg via INTRAVENOUS

## 2012-11-08 MED ORDER — 0.9 % SODIUM CHLORIDE (POUR BTL) OPTIME
TOPICAL | Status: DC | PRN
  Administered 2012-11-08: 1000 mL

## 2012-11-08 MED ORDER — DIPHENHYDRAMINE HCL 50 MG/ML IJ SOLN: 12.5000 mg | Freq: Four times a day (QID) | INTRAMUSCULAR | Status: DC | PRN

## 2012-11-08 MED ORDER — SODIUM CHLORIDE 0.9 % IV BOLUS (SEPSIS)
500.0000 mL | Freq: Once | INTRAVENOUS | Status: AC
  Administered 2012-11-08: 500 mL via INTRAVENOUS

## 2012-11-08 MED ORDER — ALBUTEROL SULFATE (5 MG/ML) 0.5% IN NEBU: 2.5000 mg | INHALATION_SOLUTION | RESPIRATORY_TRACT | Status: DC | PRN

## 2012-11-08 MED ORDER — DIPHENHYDRAMINE HCL 12.5 MG/5ML PO ELIX
12.5000 mg | ORAL_SOLUTION | Freq: Four times a day (QID) | ORAL | Status: DC | PRN
  Filled 2012-11-08: qty 5

## 2012-11-08 MED ORDER — NALOXONE HCL 0.4 MG/ML IJ SOLN: 0.4000 mg | INTRAMUSCULAR | Status: DC | PRN

## 2012-11-08 MED ORDER — OXYCODONE HCL 5 MG PO TABS: 5.0000 mg | ORAL_TABLET | Freq: Once | ORAL | Status: DC | PRN

## 2012-11-08 MED ORDER — PROMETHAZINE HCL 25 MG/ML IJ SOLN
6.2500 mg | INTRAMUSCULAR | Status: DC | PRN
  Administered 2012-11-08: 6.25 mg via INTRAVENOUS

## 2012-11-08 MED ORDER — ALBUTEROL SULFATE (5 MG/ML) 0.5% IN NEBU
2.5000 mg | INHALATION_SOLUTION | Freq: Four times a day (QID) | RESPIRATORY_TRACT | Status: DC
  Administered 2012-11-08 – 2012-11-09 (×3): 2.5 mg via RESPIRATORY_TRACT
  Filled 2012-11-08 (×3): qty 0.5

## 2012-11-08 MED ORDER — LACTATED RINGERS IV SOLN
INTRAVENOUS | Status: DC | PRN
  Administered 2012-11-08 (×2): via INTRAVENOUS

## 2012-11-08 MED ORDER — VECURONIUM BROMIDE 10 MG IV SOLR
INTRAVENOUS | Status: DC | PRN
  Administered 2012-11-08: 3 mg via INTRAVENOUS
  Administered 2012-11-08: 5 mg via INTRAVENOUS
  Administered 2012-11-08: 2 mg via INTRAVENOUS

## 2012-11-08 MED ORDER — SODIUM CHLORIDE 0.9 % IJ SOLN: 9.0000 mL | INTRAMUSCULAR | Status: DC | PRN

## 2012-11-08 MED ORDER — HEMOSTATIC AGENTS (NO CHARGE) OPTIME
TOPICAL | Status: DC | PRN
  Administered 2012-11-08: 1 via TOPICAL

## 2012-11-08 MED ORDER — HYDROMORPHONE HCL PF 1 MG/ML IJ SOLN
0.2500 mg | INTRAMUSCULAR | Status: DC | PRN
  Administered 2012-11-08 (×4): 0.5 mg via INTRAVENOUS

## 2012-11-08 MED ORDER — HYDROMORPHONE 0.3 MG/ML IV SOLN
INTRAVENOUS | Status: DC
  Administered 2012-11-08: 0.9 mg via INTRAVENOUS
  Administered 2012-11-08: 16:00:00 via INTRAVENOUS
  Administered 2012-11-09: 0.9 mg via INTRAVENOUS
  Administered 2012-11-09: 2 mg via INTRAVENOUS
  Administered 2012-11-09: 0.3 mg via INTRAVENOUS

## 2012-11-08 MED ORDER — PHENYLEPHRINE HCL 10 MG/ML IJ SOLN
10.0000 mg | INTRAVENOUS | Status: DC | PRN
  Administered 2012-11-08: 60 ug/min via INTRAVENOUS

## 2012-11-08 MED ORDER — PROPOFOL 10 MG/ML IV BOLUS
INTRAVENOUS | Status: DC | PRN
  Administered 2012-11-08: 150 mg via INTRAVENOUS

## 2012-11-08 MED ORDER — HEMOSTATIC AGENTS (NO CHARGE) OPTIME
TOPICAL | Status: DC | PRN
  Administered 2012-11-08: 4 via TOPICAL

## 2012-11-08 MED ORDER — NEOSTIGMINE METHYLSULFATE 1 MG/ML IJ SOLN
INTRAMUSCULAR | Status: DC | PRN
  Administered 2012-11-08: 3 mg via INTRAVENOUS

## 2012-11-08 MED ORDER — ALBUTEROL SULFATE HFA 108 (90 BASE) MCG/ACT IN AERS: 2.0000 | INHALATION_SPRAY | Freq: Four times a day (QID) | RESPIRATORY_TRACT | Status: DC | PRN

## 2012-11-08 MED ORDER — GLYCOPYRROLATE 0.2 MG/ML IJ SOLN
INTRAMUSCULAR | Status: DC | PRN
  Administered 2012-11-08: 0.6 mg via INTRAVENOUS

## 2012-11-08 MED ORDER — ALBUMIN HUMAN 5 % IV SOLN
INTRAVENOUS | Status: DC | PRN
  Administered 2012-11-08 (×2): via INTRAVENOUS

## 2012-11-08 MED ORDER — ACETAMINOPHEN 10 MG/ML IV SOLN
1000.0000 mg | Freq: Four times a day (QID) | INTRAVENOUS | Status: AC
  Administered 2012-11-09 (×4): 1000 mg via INTRAVENOUS
  Filled 2012-11-08 (×4): qty 100

## 2012-11-08 MED ORDER — IPRATROPIUM BROMIDE 0.02 % IN SOLN
0.5000 mg | Freq: Four times a day (QID) | RESPIRATORY_TRACT | Status: DC
  Administered 2012-11-08 – 2012-11-13 (×21): 0.5 mg via RESPIRATORY_TRACT
  Filled 2012-11-08 (×23): qty 2.5

## 2012-11-08 MED ORDER — SUCCINYLCHOLINE CHLORIDE 20 MG/ML IJ SOLN
INTRAMUSCULAR | Status: DC | PRN
  Administered 2012-11-08: 120 mg via INTRAVENOUS

## 2012-11-08 MED ORDER — ONDANSETRON HCL 4 MG/2ML IJ SOLN: 4.0000 mg | Freq: Four times a day (QID) | INTRAMUSCULAR | Status: DC | PRN

## 2012-11-08 MED ORDER — MEPERIDINE HCL 25 MG/ML IJ SOLN: 6.2500 mg | INTRAMUSCULAR | Status: DC | PRN

## 2012-11-08 SURGICAL SUPPLY — 47 items
BLADE SURG ROTATE 9660 (MISCELLANEOUS) IMPLANT
CANISTER SUCTION 2500CC (MISCELLANEOUS) ×2 IMPLANT
CHLORAPREP W/TINT 26ML (MISCELLANEOUS) ×2 IMPLANT
CLOTH BEACON ORANGE TIMEOUT ST (SAFETY) ×2 IMPLANT
COVER MAYO STAND STRL (DRAPES) ×2 IMPLANT
COVER SURGICAL LIGHT HANDLE (MISCELLANEOUS) ×2 IMPLANT
DRAPE LAPAROSCOPIC ABDOMINAL (DRAPES) ×2 IMPLANT
DRAPE UTILITY 15X26 W/TAPE STR (DRAPE) ×4 IMPLANT
DRAPE WARM FLUID 44X44 (DRAPE) ×2 IMPLANT
DRSG VAC ATS LRG SENSATRAC (GAUZE/BANDAGES/DRESSINGS) ×2 IMPLANT
ELECT BLADE 6.5 EXT (BLADE) ×2 IMPLANT
ELECT CAUTERY BLADE 6.4 (BLADE) ×2 IMPLANT
ELECT REM PT RETURN 9FT ADLT (ELECTROSURGICAL) ×2
ELECTRODE REM PT RTRN 9FT ADLT (ELECTROSURGICAL) ×1 IMPLANT
GLOVE BIO SURGEON STRL SZ8 (GLOVE) ×2 IMPLANT
GLOVE BIOGEL PI IND STRL 8 (GLOVE) ×1 IMPLANT
GLOVE BIOGEL PI INDICATOR 8 (GLOVE) ×1
GOWN STRL NON-REIN LRG LVL3 (GOWN DISPOSABLE) ×6 IMPLANT
HEMOSTAT SNOW SURGICEL 2X4 (HEMOSTASIS) ×8 IMPLANT
KIT BASIN OR (CUSTOM PROCEDURE TRAY) ×2 IMPLANT
KIT COLOSTOMY ILEOSTOMY 2.75 (WOUND CARE) ×2 IMPLANT
KIT ROOM TURNOVER OR (KITS) ×2 IMPLANT
LIGASURE IMPACT 36 18CM CVD LR (INSTRUMENTS) ×2 IMPLANT
NS IRRIG 1000ML POUR BTL (IV SOLUTION) ×6 IMPLANT
PACK GENERAL/GYN (CUSTOM PROCEDURE TRAY) ×2 IMPLANT
PAD ARMBOARD 7.5X6 YLW CONV (MISCELLANEOUS) ×2 IMPLANT
PAD NEG PRESSURE SENSATRAC (MISCELLANEOUS) ×2 IMPLANT
PAD SHARPS MAGNETIC DISPOSAL (MISCELLANEOUS) IMPLANT
RELOAD PROXIMATE 75MM BLUE (ENDOMECHANICALS) ×2 IMPLANT
SPECIMEN JAR X LARGE (MISCELLANEOUS) IMPLANT
SPONGE LAP 18X18 X RAY DECT (DISPOSABLE) ×4 IMPLANT
STAPLER PROXIMATE 75MM BLUE (STAPLE) ×2 IMPLANT
STAPLER VISISTAT 35W (STAPLE) ×2 IMPLANT
SUCTION POOLE TIP (SUCTIONS) ×2 IMPLANT
SUT PDS AB 1 TP1 96 (SUTURE) ×4 IMPLANT
SUT PROLENE 2 0 SH 30 (SUTURE) ×2 IMPLANT
SUT VIC AB 2-0 SH 18 (SUTURE) ×2 IMPLANT
SUT VIC AB 3-0 SH 18 (SUTURE) ×2 IMPLANT
SUT VIC AB 3-0 SH 27 (SUTURE) ×1
SUT VIC AB 3-0 SH 27X BRD (SUTURE) ×1 IMPLANT
SUT VICRYL AB 2 0 TIES (SUTURE) ×2 IMPLANT
SUT VICRYL AB 3 0 TIES (SUTURE) ×2 IMPLANT
SYR BULB IRRIGATION 50ML (SYRINGE) IMPLANT
TOWEL OR 17X26 10 PK STRL BLUE (TOWEL DISPOSABLE) ×2 IMPLANT
TRAY FOLEY CATH 14FRSI W/METER (CATHETERS) IMPLANT
WATER STERILE IRR 1000ML POUR (IV SOLUTION) IMPLANT
YANKAUER SUCT BULB TIP NO VENT (SUCTIONS) IMPLANT

## 2012-11-08 NOTE — Progress Notes (Signed)
PULMONARY  / CRITICAL CARE MEDICINE  Name: Tayton Decaire MRN: [id number] DOB: 12-08-1950    ADMISSION DATE:  11/07/2012  REFERRING MD :  EDP  CHIEF COMPLAINT:  ABD Pain, nausea, vomiting, diarrhea  BRIEF PATIENT DESCRIPTION:  62 y/o WM with PMH of COPD, Lung cancer s/p resection at [facility] who presented to [facility] with acute onset abdominal pain with associated n/v/d.  Tx to [facility] ICU for further eval.    SIGNIFICANT EVENTS / STUDIES:  2/7 - CT ABD / Pelvis>>>Atherosclerotic disease of the abdominal aorta and visceral arteries, as above, without evidence of aortic aneurysm or dissection. There is chronic atrophy of the posterior aspect of the left kidney having an appearance consistent with prior vascular insult. Patent right common iliac artery stent  Cholelithiasis without overt CT evidence of cholecystitis  Diffuse dilatation of the colon containing stool and liquid. Findings are suggestive of enteritis. No associated abscess or small bowel dilatation.   LINES / TUBES: R IJ TLC 2/7>>>  CULTURES: BCx2 2/7>>> Stool Culture>>> Cdiff PCR>>>negative Urine 2/7 >>> negative  ANTIBIOTICS: Flagyl 2/7>>> Cipro 2/7>>> Vanco PO 2/7>>>  SUBJECTIVE:  S/p descending/sigmoid colectomy.  Extubated in PACU.  VITAL SIGNS: Temp:  [98.8 F (37.1 C)-102.5 F (39.2 C)] 100.3 F (37.9 C) (02/08 1800) Pulse Rate:  [97-134] 97 (02/08 1800) Resp:  [10-22] 10 (02/08 1800) BP: (96-139)/(56-83) 97/63 mmHg (02/08 1800) SpO2:  [92 %-99 %] 93 % (02/08 1800) Weight:  [213 lb 13.5 oz (97 kg)] 213 lb 13.5 oz (97 kg) (02/08 0500)  HEMODYNAMICS: CVP:  [3 mmHg] 3 mmHg  INTAKE / OUTPUT: Intake/Output     02/08 0701 - 02/09 0700   I.V. (mL/kg) 2275 (23.5)   IV Piggyback 650   Total Intake(mL/kg) 2925 (30.2)   Urine (mL/kg/hr) 1230 (1)   Blood 300 (0.2)   Total Output 1530   Net +1395       Emesis Occurrence 200 x     PHYSICAL EXAMINATION: General: Ill appearing Neuro:  Somnolent, moves all  extremities HEENT: NG tube in place Cardiovascular:  s1s2 tachycardic, regular Lungs: decreased BS, no wheeze Abdomen:  Colostomy LLQ, wound dressing clean Musculoskeletal:  No edema Skin: no rashes  LABS:  Recent Labs Lab 11/07/12 1220 11/07/12 1839 11/07/12 1841 11/08/12 0505 11/08/12 1338  HGB 16.4  --   --  11.6* 11.6*  WBC 25.9*  --   --  18.3*  --   PLT 421*  --   --  290  --   NA 133*  --   --  138 139  K 4.1  --   --  3.6 3.6  CL 93*  --   --  107  --   CO2 24  --   --  20  --   GLUCOSE 163*  --   --  131* 138*  BUN 27*  --   --  22  --   CREATININE 1.20  --   --  1.18  --   CALCIUM 10.2  --   --  7.7*  --   AST 26  --   --  18  --   ALT 20  --   --  13  --   ALKPHOS 299*  --   --  218*  --   BILITOT 0.4  --   --  0.5  --   PROT 9.0*  --   --  5.9*  --   ALBUMIN 4.6  --   --  2.8*  --   APTT 37  --   --   --   --   INR 0.98  --   --   --   --   LATICACIDVEN 1.4 1.9  --   --   --   TROPONINI <0.30  --   --   --   --   PROCALCITON  --   --  1.07  --   --     Recent Labs Lab 11/07/12 1558 11/07/12 2003  GLUCAP 117* 123*    Imaging: Ct Angio Chest Pe W/cm &/or Wo Cm  11/07/2012  *RADIOLOGY REPORT*  Clinical Data:  Abdominal pain, hypotension, fever and chills.  CT ANGIOGRAPHY CHEST, ABDOMEN AND PELVIS  Technique:  Multidetector CT imaging through the chest, abdomen and pelvis was performed using the standard protocol during bolus administration of intravenous contrast.  Multiplanar reconstructed images including MIPs were obtained and reviewed to evaluate the vascular anatomy.  Contrast: , OMNIPAQUE IOHEXOL 350 MG/ML SOLN  Comparison:   None.  CTA CHEST  Findings:  The ascending thoracic aorta is mildly dilated, measuring approximately 4.1 - 4.2 cm in greatest caliber.  This dilatation begins just above the sinotubular junction and continues to nearly the aortic arch.  There is no associated dissection. Correlation suggested with echocardiography electively  to exclude significant aortic valvular disease.  Irregular and ulcerated plaque is present at the level of the aortic arch.  Proximal great vessels show no significant occlusive disease.  The descending thoracic aorta is of normal caliber.  The heart size is normal.  Scattered emphysematous bullae are present in the lungs.  Parenchymal scarring and atelectasis present bilaterally.  No focal pulmonary infiltrate, pulmonary edema or pleural fluid is identified.  No bony abnormalities.   Review of the MIP images confirms the above findings.  IMPRESSION:  1.  Mild dilatation of the ascending thoracic aorta, measuring 4.1 - 4.2 cm in greatest caliber.  Recommend elective correlation with echocardiography to exclude significant aortic valvular disease. 2.  Atherosclerotic disease of the aorta.  No aortic dissection is identified.  CTA ABDOMEN AND PELVIS  Findings:  The abdominal aorta shows scattered atherosclerotic disease without evidence of aneurysm or dissection.  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  Mild eccentric narrowing of the proximal SMA is present of approximately 30%.  The inferior mesenteric artery is not well delineated and may be chronically occluded.  Single left renal artery and three separate left renal arteries are present.  There is some atrophy involving the posterior and medial aspect of the left kidney with appearance suggestive of old vascular insult/infarct.  This may be secondary to occlusion of a segmental renal artery branch or separate fourth renal artery in the past.  A patent right sided iliac stent is present in the common iliac artery.  No other evidence of significant occlusive disease involving the iliac arteries or common femoral arteries.  Nonvascular evaluation shows some degree of dilatation of the entire colon which contains stool and liquid.  Appearance suggests the possibility of enteritis.  No evidence of bowel obstruction or  perforation.  No focal abscess is identified.  At least two large gallstones are present.  The gallbladder is not thickened or distended.  No masses, hernias or enlarged lymph nodes are identified.  Degenerative changes are present in the lumbar spine.   Review of the MIP images confirms the above findings.  IMPRESSION:  1.  Atherosclerotic disease  of the abdominal aorta and visceral arteries, as above, without evidence of aortic aneurysm or dissection.  There is chronic atrophy of the posterior aspect of the left kidney having an appearance consistent with prior vascular insult. 2.  Patent right common iliac artery stent 3.  Cholelithiasis without overt CT evidence of cholecystitis 4.  Diffuse dilatation of the colon containing stool and liquid. Findings are suggestive of enteritis.  No associated abscess or small bowel dilatation.   Original Report Authenticated By: Irish Lack, M.D.    Dg Chest Port 1 View  11/07/2012  *RADIOLOGY REPORT*  Clinical Data: Central line placement  PORTABLE CHEST - 1 VIEW  Comparison: Portable chest x-ray of 11/07/2012  Findings: The right IJ central venous line tip overlies the lower SVC.  No pneumothorax is seen.  Basilar atelectasis is noted left greater than right and developing pneumonia at the left lung base cannot be excluded.  The heart is mildly enlarged.  No acute skeletal abnormality is seen.  IMPRESSION:  1.  Right IJ central venous line tip overlies the lower SVC. 2.  Prominent markings at the lung bases left greater than right most consistent with atelectasis.  Cannot exclude developing pneumonia at the left lung base.   Original Report Authenticated By: Dwyane Dee, M.D.    Dg Chest Port 1 View  11/07/2012  *RADIOLOGY REPORT*  Clinical Data: Hypotension, abdominal pain, chills, lethargy, history lung cancer, COPD, hypertension  PORTABLE CHEST - 1 VIEW  Comparison: Portable exam 1248 hours without priors for comparison  Findings: Normal heart size and mediastinal  contours. Aorta is tortuous. Pulmonary vascular congestion. Minimal interstitial prominence with bibasilar atelectasis greater on left. No segmental consolidation, pleural effusion or pneumothorax. Surgical clips at right apex.  IMPRESSION: Bibasilar atelectasis greater on left.   Original Report Authenticated By: Ulyses Southward, M.D.    Dg Abd Portable 1v  11/08/2012  *RADIOLOGY REPORT*  Clinical Data: Abdominal distention  PORTABLE ABDOMEN - 1 VIEW  Comparison: None.  Findings: Supine portable views of the abdomen show distended small bowel loops measuring up to 4.2 cm in diameter.  No distention of the colon is seen, and these findings are most consistent with a partial small bowel obstruction.  Free air cannot be assessed on portable supine views, with an erect view being necessary.  IMPRESSION: Suspect partial small bowel obstruction.  Cannot assess free air on portable supine view.   Original Report Authenticated By: Dwyane Dee, M.D.    Ct Angio Abd/pel W/ And/or W/o  11/07/2012  *RADIOLOGY REPORT*  Clinical Data:  Abdominal pain, hypotension, fever and chills.  CT ANGIOGRAPHY CHEST, ABDOMEN AND PELVIS  Technique:  Multidetector CT imaging through the chest, abdomen and pelvis was performed using the standard protocol during bolus administration of intravenous contrast.  Multiplanar reconstructed images including MIPs were obtained and reviewed to evaluate the vascular anatomy.  Contrast: , OMNIPAQUE IOHEXOL 350 MG/ML SOLN  Comparison:   None.  CTA CHEST  Findings:  The ascending thoracic aorta is mildly dilated, measuring approximately 4.1 - 4.2 cm in greatest caliber.  This dilatation begins just above the sinotubular junction and continues to nearly the aortic arch.  There is no associated dissection. Correlation suggested with echocardiography electively to exclude significant aortic valvular disease.  Irregular and ulcerated plaque is present at the level of the aortic arch.  Proximal great vessels  show no significant occlusive disease.  The descending thoracic aorta is of normal caliber.  The heart size is normal.  Scattered  emphysematous bullae are present in the lungs.  Parenchymal scarring and atelectasis present bilaterally.  No focal pulmonary infiltrate, pulmonary edema or pleural fluid is identified.  No bony abnormalities.   Review of the MIP images confirms the above findings.  IMPRESSION:  1.  Mild dilatation of the ascending thoracic aorta, measuring 4.1 - 4.2 cm in greatest caliber.  Recommend elective correlation with echocardiography to exclude significant aortic valvular disease. 2.  Atherosclerotic disease of the aorta.  No aortic dissection is identified.  CTA ABDOMEN AND PELVIS  Findings:  The abdominal aorta shows scattered atherosclerotic disease without evidence of aneurysm or dissection.  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  Mild eccentric narrowing of the proximal SMA is present of approximately 30%.  The inferior mesenteric artery is not well delineated and may be chronically occluded.  Single left renal artery and three separate left renal arteries are present.  There is some atrophy involving the posterior and medial aspect of the left kidney with appearance suggestive of old vascular insult/infarct.  This may be secondary to occlusion of a segmental renal artery branch or separate fourth renal artery in the past.  A patent right sided iliac stent is present in the common iliac artery.  No other evidence of significant occlusive disease involving the iliac arteries or common femoral arteries.  Nonvascular evaluation shows some degree of dilatation of the entire colon which contains stool and liquid.  Appearance suggests the possibility of enteritis.  No evidence of bowel obstruction or perforation.  No focal abscess is identified.  At least two large gallstones are present.  The gallbladder is not thickened or distended.  No masses,  hernias or enlarged lymph nodes are identified.  Degenerative changes are present in the lumbar spine.   Review of the MIP images confirms the above findings.  IMPRESSION:  1.  Atherosclerotic disease of the abdominal aorta and visceral arteries, as above, without evidence of aortic aneurysm or dissection.  There is chronic atrophy of the posterior aspect of the left kidney having an appearance consistent with prior vascular insult. 2.  Patent right common iliac artery stent 3.  Cholelithiasis without overt CT evidence of cholecystitis 4.  Diffuse dilatation of the colon containing stool and liquid. Findings are suggestive of enteritis.  No associated abscess or small bowel dilatation.   Original Report Authenticated By: Irish Lack, M.D.      ASSESSMENT / PLAN:  PULMONARY A: Lower lobe atelectasis - in setting of pain / abdominal splinting COPD - on symbicort at baseline  P:   -scheduled BD's -bronchial hygiene -oxygen to keep SpO2 > 92%  CARDIOVASCULAR A:  Hypovolemic Shock - in setting of ischemic colitis Swollen upper lip - eyelid and lip edema, no tongue involvement, unclear etiology; improved 2/8 Atrial Fibrillation  - noted in [facility] & resolved on presentation to [facility]  P:  -fluids to keep CVP > 6 -hold outpt anti-HTN meds  RENAL A:   Hyponatremia Hypochloremia  P:   -NS resuscitation   GASTROINTESTINAL A:   Ischemic colitis s/p laparotomy 2/8.  P:   -post op care per [facility]  HEMATOLOGIC A:   Leukocytosis.  P:  -f/u CBC  INFECTIOUS A:   N/V/D with associated Acute ABD Pain  P:   -abx as above  ENDOCRINE A:   Hyperglycemia - noted on initial BMP  P:   -monitor BMP glucose  NEUROLOGIC A:   Pain   P:   -  Dilaudid PCA   CC time 35 minutes.  Coralyn Helling, MD [facility] Pulmonary/Critical Care 11/08/2012, 7:30 PM Pager:  [phone] After 3pm call: [phone]

## 2012-11-08 NOTE — Progress Notes (Signed)
Pt pulled NG out on arrival to PACU.  Reinserted by CRNA. Left nare.

## 2012-11-08 NOTE — Anesthesia Postprocedure Evaluation (Signed)
  Anesthesia Post-op Note  Patient: James [name]  Procedure(s) Performed: Procedure(s): EXPLORATORY LAPAROTOMY with descending and sigmoid colectomy; wound vac placement and colostomy (N/A)  Patient Location: PACU  Anesthesia Type:General  Level of Consciousness: oriented, sedated and responds to stimulation  Airway and Oxygen Therapy: Patient Spontanous Breathing and Patient connected to nasal cannula oxygen  Post-op Pain: mild  Post-op Assessment: Post-op Vital signs reviewed, Patient's Cardiovascular Status Stable, Respiratory Function Stable, Patent Airway, No signs of Nausea or vomiting, Pain level controlled, No headache, No backache, No residual numbness and No residual motor weakness  Post-op Vital Signs: Reviewed and stable  Complications: No apparent anesthesia complications

## 2012-11-08 NOTE — H&P (View-Only) (Signed)
Day of Surgery  Subjective: Pt with worsening abdominal pain.  Temp and HR up.  Tachycardia noted.  Objective: Vital signs in last 24 hours: Temp:  [92.1 F (33.4 C)-102.5 F (39.2 C)] 100.9 F (38.3 C) (02/08 0731) Pulse Rate:  [81-126] 116 (02/08 0731) Resp:  [14-20] 19 (02/08 0731) BP: (66-139)/(36-83) 125/63 mmHg (02/08 0731) SpO2:  [88 %-100 %] 96 % (02/08 0731) Weight:  [201 lb (91.173 kg)-213 lb 13.5 oz (97 kg)] 213 lb 13.5 oz (97 kg) (02/08 0500) Last BM Date: 11/08/12  Intake/Output from previous day: 02/07 0701 - 02/08 0700 In: 3275 [I.V.:2975; IV Piggyback:300] Out: 2680 [Urine:2680] Intake/Output this shift:    Incision/Wound:tender with guarding and rebound   Lab Results:   Recent Labs  11/07/12 1220 11/08/12 0505  WBC 25.9* 18.3*  HGB 16.4 11.6*  HCT 49.2 34.4*  PLT 421* 290   BMET  Recent Labs  11/07/12 1220 11/08/12 0505  NA 133* 138  K 4.1 3.6  CL 93* 107  CO2 24 20  GLUCOSE 163* 131*  BUN 27* 22  CREATININE 1.20 1.18  CALCIUM 10.2 7.7*   PT/INR  Recent Labs  11/07/12 1220  LABPROT 12.9  INR 0.98   ABG No results found for this basename: PHART, PCO2, PO2, HCO3,  in the last 72 hours  Studies/Results: Ct Angio Chest Pe W/cm &/or Wo Cm  11/07/2012  *RADIOLOGY REPORT*  Clinical Data:  Abdominal pain, hypotension, fever and chills.  CT ANGIOGRAPHY CHEST, ABDOMEN AND PELVIS  Technique:  Multidetector CT imaging through the chest, abdomen and pelvis was performed using the standard protocol during bolus administration of intravenous contrast.  Multiplanar reconstructed images including MIPs were obtained and reviewed to evaluate the vascular anatomy.  Contrast: ,100mL OMNIPAQUE IOHEXOL 350 MG/ML SOLN  Comparison:   None.  CTA CHEST  Findings:  The ascending thoracic aorta is mildly dilated, measuring approximately 4.1 - 4.2 cm in greatest caliber.  This dilatation begins just above the sinotubular junction and continues to nearly the aortic  arch.  There is no associated dissection. Correlation suggested with echocardiography electively to exclude significant aortic valvular disease.  Irregular and ulcerated plaque is present at the level of the aortic arch.  Proximal great vessels show no significant occlusive disease.  The descending thoracic aorta is of normal caliber.  The heart size is normal.  Scattered emphysematous bullae are present in the lungs.  Parenchymal scarring and atelectasis present bilaterally.  No focal pulmonary infiltrate, pulmonary edema or pleural fluid is identified.  No bony abnormalities.   Review of the MIP images confirms the above findings.  IMPRESSION:  1.  Mild dilatation of the ascending thoracic aorta, measuring 4.1 - 4.2 cm in greatest caliber.  Recommend elective correlation with echocardiography to exclude significant aortic valvular disease. 2.  Atherosclerotic disease of the aorta.  No aortic dissection is identified.  CTA ABDOMEN AND PELVIS  Findings:  The abdominal aorta shows scattered atherosclerotic disease without evidence of aneurysm or dissection.  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  Mild eccentric narrowing of the proximal SMA is present of approximately 30%.  The inferior mesenteric artery is not well delineated and may be chronically occluded.  Single left renal artery and three separate left renal arteries are present.  There is some atrophy involving the posterior and medial aspect of the left kidney with appearance suggestive of old vascular insult/infarct.  This may be secondary to occlusion of a   segmental renal artery branch or separate fourth renal artery in the past.  A patent right sided iliac stent is present in the common iliac artery.  No other evidence of significant occlusive disease involving the iliac arteries or common femoral arteries.  Nonvascular evaluation shows some degree of dilatation of the entire colon which contains stool and  liquid.  Appearance suggests the possibility of enteritis.  No evidence of bowel obstruction or perforation.  No focal abscess is identified.  At least two large gallstones are present.  The gallbladder is not thickened or distended.  No masses, hernias or enlarged lymph nodes are identified.  Degenerative changes are present in the lumbar spine.   Review of the MIP images confirms the above findings.  IMPRESSION:  1.  Atherosclerotic disease of the abdominal aorta and visceral arteries, as above, without evidence of aortic aneurysm or dissection.  There is chronic atrophy of the posterior aspect of the left kidney having an appearance consistent with prior vascular insult. 2.  Patent right common iliac artery stent 3.  Cholelithiasis without overt CT evidence of cholecystitis 4.  Diffuse dilatation of the colon containing stool and liquid. Findings are suggestive of enteritis.  No associated abscess or small bowel dilatation.   Original Report Authenticated By: Glenn Yamagata, M.D.    Dg Chest Port 1 View  11/07/2012  *RADIOLOGY REPORT*  Clinical Data: Central line placement  PORTABLE CHEST - 1 VIEW  Comparison: Portable chest x-ray of 11/07/2012  Findings: The right IJ central venous line tip overlies the lower SVC.  No pneumothorax is seen.  Basilar atelectasis is noted left greater than right and developing pneumonia at the left lung base cannot be excluded.  The heart is mildly enlarged.  No acute skeletal abnormality is seen.  IMPRESSION:  1.  Right IJ central venous line tip overlies the lower SVC. 2.  Prominent markings at the lung bases left greater than right most consistent with atelectasis.  Cannot exclude developing pneumonia at the left lung base.   Original Report Authenticated By: Paul Barry, M.D.    Dg Chest Port 1 View  11/07/2012  *RADIOLOGY REPORT*  Clinical Data: Hypotension, abdominal pain, chills, lethargy, history lung cancer, COPD, hypertension  PORTABLE CHEST - 1 VIEW  Comparison:  Portable exam 1248 hours without priors for comparison  Findings: Normal heart size and mediastinal contours. Aorta is tortuous. Pulmonary vascular congestion. Minimal interstitial prominence with bibasilar atelectasis greater on left. No segmental consolidation, pleural effusion or pneumothorax. Surgical clips at right apex.  IMPRESSION: Bibasilar atelectasis greater on left.   Original Report Authenticated By: Mark Boles, M.D.    Dg Abd Portable 1v  11/08/2012  *RADIOLOGY REPORT*  Clinical Data: Abdominal distention  PORTABLE ABDOMEN - 1 VIEW  Comparison: None.  Findings: Supine portable views of the abdomen show distended small bowel loops measuring up to 4.2 cm in diameter.  No distention of the colon is seen, and these findings are most consistent with a partial small bowel obstruction.  Free air cannot be assessed on portable supine views, with an erect view being necessary.  IMPRESSION: Suspect partial small bowel obstruction.  Cannot assess free air on portable supine view.   Original Report Authenticated By: Paul Barry, M.D.    Ct Angio Abd/pel W/ And/or W/o  11/07/2012  *RADIOLOGY REPORT*  Clinical Data:  Abdominal pain, hypotension, fever and chills.  CT ANGIOGRAPHY CHEST, ABDOMEN AND PELVIS  Technique:  Multidetector CT imaging through the chest, abdomen and pelvis was performed   using the standard protocol during bolus administration of intravenous contrast.  Multiplanar reconstructed images including MIPs were obtained and reviewed to evaluate the vascular anatomy.  Contrast: ,100mL OMNIPAQUE IOHEXOL 350 MG/ML SOLN  Comparison:   None.  CTA CHEST  Findings:  The ascending thoracic aorta is mildly dilated, measuring approximately 4.1 - 4.2 cm in greatest caliber.  This dilatation begins just above the sinotubular junction and continues to nearly the aortic arch.  There is no associated dissection. Correlation suggested with echocardiography electively to exclude significant aortic valvular disease.   Irregular and ulcerated plaque is present at the level of the aortic arch.  Proximal great vessels show no significant occlusive disease.  The descending thoracic aorta is of normal caliber.  The heart size is normal.  Scattered emphysematous bullae are present in the lungs.  Parenchymal scarring and atelectasis present bilaterally.  No focal pulmonary infiltrate, pulmonary edema or pleural fluid is identified.  No bony abnormalities.   Review of the MIP images confirms the above findings.  IMPRESSION:  1.  Mild dilatation of the ascending thoracic aorta, measuring 4.1 - 4.2 cm in greatest caliber.  Recommend elective correlation with echocardiography to exclude significant aortic valvular disease. 2.  Atherosclerotic disease of the aorta.  No aortic dissection is identified.  CTA ABDOMEN AND PELVIS  Findings:  The abdominal aorta shows scattered atherosclerotic disease without evidence of aneurysm or dissection.  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  Mild eccentric narrowing of the proximal SMA is present of approximately 30%.  The inferior mesenteric artery is not well delineated and may be chronically occluded.  Single left renal artery and three separate left renal arteries are present.  There is some atrophy involving the posterior and medial aspect of the left kidney with appearance suggestive of old vascular insult/infarct.  This may be secondary to occlusion of a segmental renal artery branch or separate fourth renal artery in the past.  A patent right sided iliac stent is present in the common iliac artery.  No other evidence of significant occlusive disease involving the iliac arteries or common femoral arteries.  Nonvascular evaluation shows some degree of dilatation of the entire colon which contains stool and liquid.  Appearance suggests the possibility of enteritis.  No evidence of bowel obstruction or perforation.  No focal abscess is identified.  At  least two large gallstones are present.  The gallbladder is not thickened or distended.  No masses, hernias or enlarged lymph nodes are identified.  Degenerative changes are present in the lumbar spine.   Review of the MIP images confirms the above findings.  IMPRESSION:  1.  Atherosclerotic disease of the abdominal aorta and visceral arteries, as above, without evidence of aortic aneurysm or dissection.  There is chronic atrophy of the posterior aspect of the left kidney having an appearance consistent with prior vascular insult. 2.  Patent right common iliac artery stent 3.  Cholelithiasis without overt CT evidence of cholecystitis 4.  Diffuse dilatation of the colon containing stool and liquid. Findings are suggestive of enteritis.  No associated abscess or small bowel dilatation.   Original Report Authenticated By: Glenn Yamagata, M.D.     Anti-infectives: Anti-infectives   Start     Dose/Rate Route Frequency Ordered Stop   11/07/12 1800  vancomycin (VANCOCIN) 50 mg/mL oral solution 500 mg     500 mg Oral 4 times per day 11/07/12 1649     11/07/12 1800  metroNIDAZOLE (  FLAGYL) IVPB 500 mg     500 mg 100 mL/hr over 60 Minutes Intravenous 4 times per day 11/07/12 1649     11/07/12 1700  ciprofloxacin (CIPRO) IVPB 400 mg     400 mg 200 mL/hr over 60 Minutes Intravenous Every 12 hours 11/07/12 1649     11/07/12 1336  ceFEPIme (MAXIPIME) 1 G injection    Comments:  HENSON, VICKIE: cabinet override      11/07/12 1336 11/07/12 1356   11/07/12 1230  vancomycin (VANCOCIN) IVPB 1000 mg/200 mL premix     1,000 mg 200 mL/hr over 60 Minutes Intravenous  Once 11/07/12 1226 11/07/12 1515   11/07/12 1230  ceFEPIme (MAXIPIME) 1 g in dextrose 5 % 50 mL IVPB  Status:  Discontinued     1 g 100 mL/hr over 30 Minutes Intravenous  Once 11/07/12 1226 11/07/12 1736      Assessment/Plan: s/p Procedure(s): EXPLORATORY LAPAROTOMY (N/A) Possible ishemia of colon SB  to OR for emergent ex lap due to increasing  abdominal pain with negative radiographic work up.  History of vascular disease warrants concern for ischemic colitis.  Long discussion with pt,  wife and sons at bedside.  Discussed options of observation,  transfer to tertiary care or ex lap.  Risks,  benefits of each discussed at great length.  Surgical risks include bleeding,  infection,  organ failure,  ostomy,  open abdomen,  death,  organ injury,  and need for multiple procedures.  the patient and family agree to proceed.  LOS: 1 day    Jaidee Stipe A. 11/08/2012  

## 2012-11-08 NOTE — Progress Notes (Signed)
Day of Surgery  Subjective: Pt with worsening abdominal pain.  Temp and HR up.  Tachycardia noted.  Objective: Vital signs in last 24 hours: Temp:  [92.1 F (33.4 C)-102.5 F (39.2 C)] 100.9 F (38.3 C) (02/08 0731) Pulse Rate:  [81-126] 116 (02/08 0731) Resp:  [14-20] 19 (02/08 0731) BP: (66-139)/(36-83) 125/63 mmHg (02/08 0731) SpO2:  [88 %-100 %] 96 % (02/08 0731) Weight:  [201 lb (91.173 kg)-213 lb 13.5 oz (97 kg)] 213 lb 13.5 oz (97 kg) (02/08 0500) Last BM Date: 11/08/12  Intake/Output from previous day: 02/07 0701 - 02/08 0700 In: 3275 [I.V.:2975; IV Piggyback:300] Out: 2680 [Urine:2680] Intake/Output this shift:    Incision/Wound:tender with guarding and rebound   Lab Results:   Recent Labs  11/07/12 1220 11/08/12 0505  WBC 25.9* 18.3*  HGB 16.4 11.6*  HCT 49.2 34.4*  PLT 421* 290   BMET  Recent Labs  11/07/12 1220 11/08/12 0505  NA 133* 138  K 4.1 3.6  CL 93* 107  CO2 24 20  GLUCOSE 163* 131*  BUN 27* 22  CREATININE 1.20 1.18  CALCIUM 10.2 7.7*   PT/INR  Recent Labs  11/07/12 1220  LABPROT 12.9  INR 0.98   ABG No results found for this basename: PHART, PCO2, PO2, HCO3,  in the last 72 hours  Studies/Results: Ct Angio Chest Pe W/cm &/or Wo Cm  11/07/2012  *RADIOLOGY REPORT*  Clinical Data:  Abdominal pain, hypotension, fever and chills.  CT ANGIOGRAPHY CHEST, ABDOMEN AND PELVIS  Technique:  Multidetector CT imaging through the chest, abdomen and pelvis was performed using the standard protocol during bolus administration of intravenous contrast.  Multiplanar reconstructed images including MIPs were obtained and reviewed to evaluate the vascular anatomy.  Contrast: , OMNIPAQUE IOHEXOL 350 MG/ML SOLN  Comparison:   None.  CTA CHEST  Findings:  The ascending thoracic aorta is mildly dilated, measuring approximately 4.1 - 4.2 cm in greatest caliber.  This dilatation begins just above the sinotubular junction and continues to nearly the aortic  arch.  There is no associated dissection. Correlation suggested with echocardiography electively to exclude significant aortic valvular disease.  Irregular and ulcerated plaque is present at the level of the aortic arch.  Proximal great vessels show no significant occlusive disease.  The descending thoracic aorta is of normal caliber.  The heart size is normal.  Scattered emphysematous bullae are present in the lungs.  Parenchymal scarring and atelectasis present bilaterally.  No focal pulmonary infiltrate, pulmonary edema or pleural fluid is identified.  No bony abnormalities.   Review of the MIP images confirms the above findings.  IMPRESSION:  1.  Mild dilatation of the ascending thoracic aorta, measuring 4.1 - 4.2 cm in greatest caliber.  Recommend elective correlation with echocardiography to exclude significant aortic valvular disease. 2.  Atherosclerotic disease of the aorta.  No aortic dissection is identified.  CTA ABDOMEN AND PELVIS  Findings:  The abdominal aorta shows scattered atherosclerotic disease without evidence of aneurysm or dissection.  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  Mild eccentric narrowing of the proximal SMA is present of approximately 30%.  The inferior mesenteric artery is not well delineated and may be chronically occluded.  Single left renal artery and three separate left renal arteries are present.  There is some atrophy involving the posterior and medial aspect of the left kidney with appearance suggestive of old vascular insult/infarct.  This may be secondary to occlusion of a  segmental renal artery branch or separate fourth renal artery in the past.  A patent right sided iliac stent is present in the common iliac artery.  No other evidence of significant occlusive disease involving the iliac arteries or common femoral arteries.  Nonvascular evaluation shows some degree of dilatation of the entire colon which contains stool and  liquid.  Appearance suggests the possibility of enteritis.  No evidence of bowel obstruction or perforation.  No focal abscess is identified.  At least two large gallstones are present.  The gallbladder is not thickened or distended.  No masses, hernias or enlarged lymph nodes are identified.  Degenerative changes are present in the lumbar spine.   Review of the MIP images confirms the above findings.  IMPRESSION:  1.  Atherosclerotic disease of the abdominal aorta and visceral arteries, as above, without evidence of aortic aneurysm or dissection.  There is chronic atrophy of the posterior aspect of the left kidney having an appearance consistent with prior vascular insult. 2.  Patent right common iliac artery stent 3.  Cholelithiasis without overt CT evidence of cholecystitis 4.  Diffuse dilatation of the colon containing stool and liquid. Findings are suggestive of enteritis.  No associated abscess or small bowel dilatation.   Original Report Authenticated By: Irish Lack, M.D.    Dg Chest Port 1 View  11/07/2012  *RADIOLOGY REPORT*  Clinical Data: Central line placement  PORTABLE CHEST - 1 VIEW  Comparison: Portable chest x-ray of 11/07/2012  Findings: The right IJ central venous line tip overlies the lower SVC.  No pneumothorax is seen.  Basilar atelectasis is noted left greater than right and developing pneumonia at the left lung base cannot be excluded.  The heart is mildly enlarged.  No acute skeletal abnormality is seen.  IMPRESSION:  1.  Right IJ central venous line tip overlies the lower SVC. 2.  Prominent markings at the lung bases left greater than right most consistent with atelectasis.  Cannot exclude developing pneumonia at the left lung base.   Original Report Authenticated By: Dwyane Dee, M.D.    Dg Chest Port 1 View  11/07/2012  *RADIOLOGY REPORT*  Clinical Data: Hypotension, abdominal pain, chills, lethargy, history lung cancer, COPD, hypertension  PORTABLE CHEST - 1 VIEW  Comparison:  Portable exam 1248 hours without priors for comparison  Findings: Normal heart size and mediastinal contours. Aorta is tortuous. Pulmonary vascular congestion. Minimal interstitial prominence with bibasilar atelectasis greater on left. No segmental consolidation, pleural effusion or pneumothorax. Surgical clips at right apex.  IMPRESSION: Bibasilar atelectasis greater on left.   Original Report Authenticated By: Ulyses Southward, M.D.    Dg Abd Portable 1v  11/08/2012  *RADIOLOGY REPORT*  Clinical Data: Abdominal distention  PORTABLE ABDOMEN - 1 VIEW  Comparison: None.  Findings: Supine portable views of the abdomen show distended small bowel loops measuring up to 4.2 cm in diameter.  No distention of the colon is seen, and these findings are most consistent with a partial small bowel obstruction.  Free air cannot be assessed on portable supine views, with an erect view being necessary.  IMPRESSION: Suspect partial small bowel obstruction.  Cannot assess free air on portable supine view.   Original Report Authenticated By: Dwyane Dee, M.D.    Ct Angio Abd/pel W/ And/or W/o  11/07/2012  *RADIOLOGY REPORT*  Clinical Data:  Abdominal pain, hypotension, fever and chills.  CT ANGIOGRAPHY CHEST, ABDOMEN AND PELVIS  Technique:  Multidetector CT imaging through the chest, abdomen and pelvis was performed  using the standard protocol during bolus administration of intravenous contrast.  Multiplanar reconstructed images including MIPs were obtained and reviewed to evaluate the vascular anatomy.  Contrast: , OMNIPAQUE IOHEXOL 350 MG/ML SOLN  Comparison:   None.  CTA CHEST  Findings:  The ascending thoracic aorta is mildly dilated, measuring approximately 4.1 - 4.2 cm in greatest caliber.  This dilatation begins just above the sinotubular junction and continues to nearly the aortic arch.  There is no associated dissection. Correlation suggested with echocardiography electively to exclude significant aortic valvular disease.   Irregular and ulcerated plaque is present at the level of the aortic arch.  Proximal great vessels show no significant occlusive disease.  The descending thoracic aorta is of normal caliber.  The heart size is normal.  Scattered emphysematous bullae are present in the lungs.  Parenchymal scarring and atelectasis present bilaterally.  No focal pulmonary infiltrate, pulmonary edema or pleural fluid is identified.  No bony abnormalities.   Review of the MIP images confirms the above findings.  IMPRESSION:  1.  Mild dilatation of the ascending thoracic aorta, measuring 4.1 - 4.2 cm in greatest caliber.  Recommend elective correlation with echocardiography to exclude significant aortic valvular disease. 2.  Atherosclerotic disease of the aorta.  No aortic dissection is identified.  CTA ABDOMEN AND PELVIS  Findings:  The abdominal aorta shows scattered atherosclerotic disease without evidence of aneurysm or dissection.  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  Mild eccentric narrowing of the proximal SMA is present of approximately 30%.  The inferior mesenteric artery is not well delineated and may be chronically occluded.  Single left renal artery and three separate left renal arteries are present.  There is some atrophy involving the posterior and medial aspect of the left kidney with appearance suggestive of old vascular insult/infarct.  This may be secondary to occlusion of a segmental renal artery branch or separate fourth renal artery in the past.  A patent right sided iliac stent is present in the common iliac artery.  No other evidence of significant occlusive disease involving the iliac arteries or common femoral arteries.  Nonvascular evaluation shows some degree of dilatation of the entire colon which contains stool and liquid.  Appearance suggests the possibility of enteritis.  No evidence of bowel obstruction or perforation.  No focal abscess is identified.  At  least two large gallstones are present.  The gallbladder is not thickened or distended.  No masses, hernias or enlarged lymph nodes are identified.  Degenerative changes are present in the lumbar spine.   Review of the MIP images confirms the above findings.  IMPRESSION:  1.  Atherosclerotic disease of the abdominal aorta and visceral arteries, as above, without evidence of aortic aneurysm or dissection.  There is chronic atrophy of the posterior aspect of the left kidney having an appearance consistent with prior vascular insult. 2.  Patent right common iliac artery stent 3.  Cholelithiasis without overt CT evidence of cholecystitis 4.  Diffuse dilatation of the colon containing stool and liquid. Findings are suggestive of enteritis.  No associated abscess or small bowel dilatation.   Original Report Authenticated By: Irish Lack, M.D.     Anti-infectives: Anti-infectives   Start     Dose/Rate Route Frequency Ordered Stop   11/07/12 1800  vancomycin (VANCOCIN) 50 mg/mL oral solution 500 mg     500 mg Oral 4 times per day 11/07/12 1649     11/07/12 1800  metroNIDAZOLE (  FLAGYL) IVPB 500 mg     500 mg 100 mL/hr over 60 Minutes Intravenous 4 times per day 11/07/12 1649     11/07/12 1700  ciprofloxacin (CIPRO) IVPB 400 mg     400 mg 200 mL/hr over 60 Minutes Intravenous Every 12 hours 11/07/12 1649     11/07/12 1336  ceFEPIme (MAXIPIME) 1 G injection    Comments:  HENSON, VICKIE: cabinet override      11/07/12 1336 11/07/12 1356   11/07/12 1230  vancomycin (VANCOCIN) IVPB 1000 mg/200 mL premix     1,000 mg 200 mL/hr over 60 Minutes Intravenous  Once 11/07/12 1226 11/07/12 1515   11/07/12 1230  ceFEPIme (MAXIPIME) 1 g in dextrose 5 % 50 mL IVPB  Status:  Discontinued     1 g 100 mL/hr over 30 Minutes Intravenous  Once 11/07/12 1226 11/07/12 1736      Assessment/Plan: s/p Procedure(s): EXPLORATORY LAPAROTOMY (N/A) Possible ishemia of colon SB  to OR for emergent ex lap due to increasing  abdominal pain with negative radiographic work up.  History of vascular disease warrants concern for ischemic colitis.  Long discussion with pt,  wife and sons at bedside.  Discussed options of observation,  transfer to tertiary care or ex lap.  Risks,  benefits of each discussed at great length.  Surgical risks include bleeding,  infection,  organ failure,  ostomy,  open abdomen,  death,  organ injury,  and need for multiple procedures.  the patient and family agree to proceed.  LOS: 1 day    Amando Ishikawa A. 11/08/2012

## 2012-11-08 NOTE — Anesthesia Procedure Notes (Signed)
Procedure Name: Intubation Date/Time: 11/08/2012 11:59 AM Performed by: James [name] A Pre-anesthesia Checklist: Patient identified, Timeout performed, Emergency Drugs available, Suction available and Patient being monitored Patient Re-evaluated:Patient Re-evaluated prior to inductionOxygen Delivery Method: Circle system utilized Preoxygenation: Pre-oxygenation with 100% oxygen Intubation Type: IV induction, Rapid sequence and Cricoid Pressure applied Laryngoscope Size: Miller and 4 Grade View: Grade III Tube type: Oral Tube size: 8.0 mm Number of attempts: 2 Airway Equipment and Method: Stylet Placement Confirmation: ETT inserted through vocal cords under direct vision,  breath sounds checked- equal and bilateral and positive ETCO2 Secured at: 22 cm Tube secured with: Tape Dental Injury: Teeth and Oropharynx as per pre-operative assessment  Comments: First look myself, unsuccesful.  Second look flores,crna, successful intubation

## 2012-11-08 NOTE — Anesthesia Preprocedure Evaluation (Signed)
Anesthesia Evaluation  Patient identified by MRN, date of birth, ID band Patient awake    Reviewed: Allergy & Precautions, H&P , NPO status   Airway Mallampati: II      Dental  (+) Edentulous Upper   Pulmonary COPD   + decreased breath sounds      Cardiovascular hypertension, + Peripheral Vascular Disease + dysrhythmias Rhythm:Irregular Rate:Normal     Neuro/Psych negative neurological ROS     GI/Hepatic GERD-  ,Acute abdomen   Endo/Other  negative endocrine ROS  Renal/GU negative Renal ROS     Musculoskeletal   Abdominal   Peds  Hematology  (+) Blood dyscrasia, ,   Anesthesia Other Findings   Reproductive/Obstetrics                           Anesthesia Physical Anesthesia Plan  ASA: III and emergent  Anesthesia Plan: General   Post-op Pain Management:    Induction: Intravenous  Airway Management Planned: Oral ETT  Additional Equipment:   Intra-op Plan:   Post-operative Plan: Extubation in OR  Informed Consent: I have reviewed the patients History and Physical, chart, labs and discussed the procedure including the risks, benefits and alternatives for the proposed anesthesia with the patient or authorized representative who has indicated his/her understanding and acceptance.   Dental advisory given  Plan Discussed with: CRNA and Surgeon  Anesthesia Plan Comments:         Anesthesia Quick Evaluation

## 2012-11-08 NOTE — Brief Op Note (Signed)
11/07/2012 - 11/08/2012  2:57 PM  PATIENT:  James [name]  62 y.o. male  PRE-OPERATIVE DIAGNOSIS:  abdominal pain; possible ischemic small bowel  POST-OPERATIVE DIAGNOSIS:  * No post-op diagnosis entered *  PROCEDURE:  Procedure(s): EXPLORATORY LAPAROTOMY with descending and sigmoid colectomy; wound vac placement and colostomy (N/A)  SURGEON:  Surgeon(s) and Role:    * Donnah Levert A. Shaquayla Klimas, MD - Primary    * Axel Filler, MD - Assisting  PHYSICIAN ASSISTANT:   ASSISTANTS: none   ANESTHESIA:   general  EBL:  Total I/O In: 2750 [I.V.:2200; IV Piggyback:550] Out: 1395 [Urine:1095; Blood:300]  BLOOD ADMINISTERED:none  DRAINS: none   LOCAL MEDICATIONS USED:  NONE  SPECIMEN:  Source of Specimen:  descending   sigmoid colon  DISPOSITION OF SPECIMEN:  PATHOLOGY  COUNTS:  YES  TOURNIQUET:  * No tourniquets in log *  DICTATION: .Other Dictation: Dictation Number   [id number]  PLAN OF CARE: Admit to inpatient   PATIENT DISPOSITION:  ICU - extubated and stable.   Delay start of Pharmacological VTE agent (>24hrs) due to surgical blood loss or risk of bleeding: yes

## 2012-11-08 NOTE — Progress Notes (Signed)
PT has refused CPAP machine for tonight. Machine is still in room if PT changes his mind. RT told PT to let us know if he does change his mind. RT will assist as needed.

## 2012-11-08 NOTE — Progress Notes (Signed)
[facility] Progress Note  Subjective: Patient continues to have continuous abdominal pain and is putting out bloody mucoid stool into flexes seal. He has not had any vomiting. He has developed some tachycardia with pulse rate currently of 122. He has been continuously febrile despite triple antibiotics. Stool cultures are pending  Objective: Vital signs in last 24 hours: Temp:  [92.1 F (33.4 C)-102.5 F (39.2 C)] 100.9 F (38.3 C) (02/08 0731) Pulse Rate:  [81-126] 116 (02/08 0731) Resp:  [14-20] 19 (02/08 0731) BP: (66-139)/(36-83) 125/63 mmHg (02/08 0731) SpO2:  [88 %-100 %] 96 % (02/08 0731) Weight:  [91.173 kg (201 lb)-97 kg (213 lb 13.5 oz)] 97 kg (213 lb 13.5 oz) (02/08 0500) Weight change:    PE: Lying on his side appears to be in distress, abdomen distended bowel sounds are diminished. There is diffuse rebound tenderness.  Lab Results: Results for orders placed during the hospital encounter of 11/07/12 (from the past 24 hour(s))  CBC WITH DIFFERENTIAL     Status: Abnormal   Collection Time    11/07/12 12:20 PM      Result Value Range   WBC 25.9 (*) 4.0 - 10.5 K/uL   RBC 5.61  4.22 - 5.81 MIL/uL   Hemoglobin 16.4  13.0 - 17.0 g/dL   HCT 45.4  09.8 - 11.9 %   MCV 87.7  78.0 - 100.0 fL   MCH 29.2  26.0 - 34.0 pg   MCHC 33.3  30.0 - 36.0 g/dL   RDW 14.7  82.9 - 56.2 %   Platelets 421 (*) 150 - 400 K/uL   Neutrophils Relative 78 (*) 43 - 77 %   Lymphocytes Relative 15  12 - 46 %   Monocytes Relative 3  3 - 12 %   Eosinophils Relative 1  0 - 5 %   Basophils Relative 0  0 - 1 %   Band Neutrophils 3  0 - 10 %   Metamyelocytes Relative 0     Myelocytes 0     Promyelocytes Absolute 0     Blasts 0     nRBC 0  0 /100 WBC   Neutro Abs 20.9 (*) 1.7 - 7.7 K/uL   Lymphs Abs 3.9  0.7 - 4.0 K/uL   Monocytes Absolute 0.8  0.1 - 1.0 K/uL   Eosinophils Absolute 0.3  0.0 - 0.7 K/uL   Basophils Absolute 0.0  0.0 - 0.1 K/uL   Smear Review MORPHOLOGY UNREMARKABLE     COMPREHENSIVE METABOLIC PANEL     Status: Abnormal   Collection Time    11/07/12 12:20 PM      Result Value Range   Sodium 133 (*) 135 - 145 mEq/L   Potassium 4.1  3.5 - 5.1 mEq/L   Chloride 93 (*) 96 - 112 mEq/L   CO2 24  19 - 32 mEq/L   Glucose, Bld 163 (*) 70 - 99 mg/dL   BUN 27 (*) 6 - 23 mg/dL   Creatinine, Ser 1.30  0.50 - 1.35 mg/dL   Calcium 86.5  8.4 - 78.4 mg/dL   Total Protein 9.0 (*) 6.0 - 8.3 g/dL   Albumin 4.6  3.5 - 5.2 g/dL   AST 26  0 - 37 U/L   ALT 20  0 - 53 U/L   Alkaline Phosphatase 299 (*) 39 - 117 U/L   Total Bilirubin 0.4  0.3 - 1.2 mg/dL   GFR calc non Af Amer 63 (*) >90 mL/min  GFR calc Af Amer 73 (*) >90 mL/min  LIPASE, BLOOD     Status: None   Collection Time    11/07/12 12:20 PM      Result Value Range   Lipase 45  11 - 59 U/L  TROPONIN I     Status: None   Collection Time    11/07/12 12:20 PM      Result Value Range   Troponin I <0.30  <0.30 ng/mL  PROTIME-INR     Status: None   Collection Time    11/07/12 12:20 PM      Result Value Range   Prothrombin Time 12.9  11.6 - 15.2 seconds   INR 0.98  0.00 - 1.49  APTT     Status: None   Collection Time    11/07/12 12:20 PM      Result Value Range   aPTT 37  24 - 37 seconds  LACTIC ACID, PLASMA     Status: None   Collection Time    11/07/12 12:20 PM      Result Value Range   Lactic Acid, Venous 1.4  0.5 - 2.2 mmol/L  URINALYSIS, ROUTINE W REFLEX MICROSCOPIC     Status: Abnormal   Collection Time    11/07/12  1:45 PM      Result Value Range   Color, Urine YELLOW  YELLOW   APPearance CLOUDY (*) CLEAR   Specific Gravity, Urine 1.021  1.005 - 1.030   pH 7.0  5.0 - 8.0   Glucose, UA NEGATIVE  NEGATIVE mg/dL   Hgb urine dipstick NEGATIVE  NEGATIVE   Bilirubin Urine NEGATIVE  NEGATIVE   Ketones, ur NEGATIVE  NEGATIVE mg/dL   Protein, ur 914 (*) NEGATIVE mg/dL   Urobilinogen, UA 1.0  0.0 - 1.0 mg/dL   Nitrite NEGATIVE  NEGATIVE   Leukocytes, UA NEGATIVE  NEGATIVE  URINE MICROSCOPIC-ADD  ON     Status: Abnormal   Collection Time    11/07/12  1:45 PM      Result Value Range   Squamous Epithelial / LPF RARE  RARE   WBC, UA 3-6  <3 WBC/hpf   RBC / HPF 3-6  <3 RBC/hpf   Bacteria, UA FEW (*) RARE   Urine-Other MUCOUS PRESENT    GLUCOSE, CAPILLARY     Status: Abnormal   Collection Time    11/07/12  3:58 PM      Result Value Range   Glucose-Capillary 117 (*) 70 - 99 mg/dL  MRSA PCR SCREENING     Status: None   Collection Time    11/07/12  4:00 PM      Result Value Range   MRSA by PCR NEGATIVE  NEGATIVE  URINALYSIS, ROUTINE W REFLEX MICROSCOPIC     Status: Abnormal   Collection Time    11/07/12  4:06 PM      Result Value Range   Color, Urine AMBER (*) YELLOW   APPearance CLEAR  CLEAR   Specific Gravity, Urine 1.031 (*) 1.005 - 1.030   pH 5.5  5.0 - 8.0   Glucose, UA NEGATIVE  NEGATIVE mg/dL   Hgb urine dipstick NEGATIVE  NEGATIVE   Bilirubin Urine SMALL (*) NEGATIVE   Ketones, ur NEGATIVE  NEGATIVE mg/dL   Protein, ur NEGATIVE  NEGATIVE mg/dL   Urobilinogen, UA 1.0  0.0 - 1.0 mg/dL   Nitrite NEGATIVE  NEGATIVE   Leukocytes, UA NEGATIVE  NEGATIVE  OCCULT BLOOD X 1 CARD TO LAB, STOOL  Status: Abnormal   Collection Time    11/07/12  5:25 PM      Result Value Range   Fecal Occult Bld POSITIVE (*) NEGATIVE  ROTAVIRUS ANTIGEN, STOOL     Status: None   Collection Time    11/07/12  5:26 PM      Result Value Range   Rotavirus NEGATIVE  NEGATIVE   Specimen Source-ROTV STOOL    LACTIC ACID, PLASMA     Status: None   Collection Time    11/07/12  6:39 PM      Result Value Range   Lactic Acid, Venous 1.9  0.5 - 2.2 mmol/L  CORTISOL     Status: None   Collection Time    11/07/12  6:41 PM      Result Value Range   Cortisol, Plasma 36.2    PROCALCITONIN     Status: None   Collection Time    11/07/12  6:41 PM      Result Value Range   Procalcitonin 1.07    GLUCOSE, CAPILLARY     Status: Abnormal   Collection Time    11/07/12  8:03 PM      Result Value Range    Glucose-Capillary 123 (*) 70 - 99 mg/dL  HEPATIC FUNCTION PANEL     Status: Abnormal   Collection Time    11/08/12  5:05 AM      Result Value Range   Total Protein 5.9 (*) 6.0 - 8.3 g/dL   Albumin 2.8 (*) 3.5 - 5.2 g/dL   AST 18  0 - 37 U/L   ALT 13  0 - 53 U/L   Alkaline Phosphatase 218 (*) 39 - 117 U/L   Total Bilirubin 0.5  0.3 - 1.2 mg/dL   Bilirubin, Direct 0.2  0.0 - 0.3 mg/dL   Indirect Bilirubin 0.3  0.3 - 0.9 mg/dL  CBC     Status: Abnormal   Collection Time    11/08/12  5:05 AM      Result Value Range   WBC 18.3 (*) 4.0 - 10.5 K/uL   RBC 3.89 (*) 4.22 - 5.81 MIL/uL   Hemoglobin 11.6 (*) 13.0 - 17.0 g/dL   HCT 40.9 (*) 81.1 - 91.4 %   MCV 88.4  78.0 - 100.0 fL   MCH 29.8  26.0 - 34.0 pg   MCHC 33.7  30.0 - 36.0 g/dL   RDW 78.2  95.6 - 21.3 %   Platelets 290  150 - 400 K/uL  BASIC METABOLIC PANEL     Status: Abnormal   Collection Time    11/08/12  5:05 AM      Result Value Range   Sodium 138  135 - 145 mEq/L   Potassium 3.6  3.5 - 5.1 mEq/L   Chloride 107  96 - 112 mEq/L   CO2 20  19 - 32 mEq/L   Glucose, Bld 131 (*) 70 - 99 mg/dL   BUN 22  6 - 23 mg/dL   Creatinine, Ser 0.86  0.50 - 1.35 mg/dL   Calcium 7.7 (*) 8.4 - 10.5 mg/dL   GFR calc non Af Amer 64 (*) >90 mL/min   GFR calc Af Amer 74 (*) >90 mL/min    Studies/Results: Ct Angio Chest Pe W/cm &/or Wo Cm  11/07/2012  *RADIOLOGY REPORT*  Clinical Data:  Abdominal pain, hypotension, fever and chills.  CT ANGIOGRAPHY CHEST, ABDOMEN AND PELVIS  Technique:  Multidetector CT imaging through the chest, abdomen and pelvis  was performed using the standard protocol during bolus administration of intravenous contrast.  Multiplanar reconstructed images including MIPs were obtained and reviewed to evaluate the vascular anatomy.  Contrast: , OMNIPAQUE IOHEXOL 350 MG/ML SOLN  Comparison:   None.  CTA CHEST  Findings:  The ascending thoracic aorta is mildly dilated, measuring approximately 4.1 - 4.2 cm in greatest  caliber.  This dilatation begins just above the sinotubular junction and continues to nearly the aortic arch.  There is no associated dissection. Correlation suggested with echocardiography electively to exclude significant aortic valvular disease.  Irregular and ulcerated plaque is present at the level of the aortic arch.  Proximal great vessels show no significant occlusive disease.  The descending thoracic aorta is of normal caliber.  The heart size is normal.  Scattered emphysematous bullae are present in the lungs.  Parenchymal scarring and atelectasis present bilaterally.  No focal pulmonary infiltrate, pulmonary edema or pleural fluid is identified.  No bony abnormalities.   Review of the MIP images confirms the above findings.  IMPRESSION:  1.  Mild dilatation of the ascending thoracic aorta, measuring 4.1 - 4.2 cm in greatest caliber.  Recommend elective correlation with echocardiography to exclude significant aortic valvular disease. 2.  Atherosclerotic disease of the aorta.  No aortic dissection is identified.  CTA ABDOMEN AND PELVIS  Findings:  The abdominal aorta shows scattered atherosclerotic disease without evidence of aneurysm or dissection.  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  Mild eccentric narrowing of the proximal SMA is present of approximately 30%.  The inferior mesenteric artery is not well delineated and may be chronically occluded.  Single left renal artery and three separate left renal arteries are present.  There is some atrophy involving the posterior and medial aspect of the left kidney with appearance suggestive of old vascular insult/infarct.  This may be secondary to occlusion of a segmental renal artery branch or separate fourth renal artery in the past.  A patent right sided iliac stent is present in the common iliac artery.  No other evidence of significant occlusive disease involving the iliac arteries or common femoral  arteries.  Nonvascular evaluation shows some degree of dilatation of the entire colon which contains stool and liquid.  Appearance suggests the possibility of enteritis.  No evidence of bowel obstruction or perforation.  No focal abscess is identified.  At least two large gallstones are present.  The gallbladder is not thickened or distended.  No masses, hernias or enlarged lymph nodes are identified.  Degenerative changes are present in the lumbar spine.   Review of the MIP images confirms the above findings.  IMPRESSION:  1.  Atherosclerotic disease of the abdominal aorta and visceral arteries, as above, without evidence of aortic aneurysm or dissection.  There is chronic atrophy of the posterior aspect of the left kidney having an appearance consistent with prior vascular insult. 2.  Patent right common iliac artery stent 3.  Cholelithiasis without overt CT evidence of cholecystitis 4.  Diffuse dilatation of the colon containing stool and liquid. Findings are suggestive of enteritis.  No associated abscess or small bowel dilatation.   Original Report Authenticated By: Irish Lack, M.D.    Dg Chest Port 1 View  11/07/2012  *RADIOLOGY REPORT*  Clinical Data: Central line placement  PORTABLE CHEST - 1 VIEW  Comparison: Portable chest x-ray of 11/07/2012  Findings: The right IJ central venous line tip overlies the lower SVC.  No pneumothorax is seen.  Basilar atelectasis is noted left greater than right and developing pneumonia at the left lung base cannot be excluded.  The heart is mildly enlarged.  No acute skeletal abnormality is seen.  IMPRESSION:  1.  Right IJ central venous line tip overlies the lower SVC. 2.  Prominent markings at the lung bases left greater than right most consistent with atelectasis.  Cannot exclude developing pneumonia at the left lung base.   Original Report Authenticated By: Dwyane Dee, M.D.    Dg Chest Port 1 View  11/07/2012  *RADIOLOGY REPORT*  Clinical Data: Hypotension,  abdominal pain, chills, lethargy, history lung cancer, COPD, hypertension  PORTABLE CHEST - 1 VIEW  Comparison: Portable exam 1248 hours without priors for comparison  Findings: Normal heart size and mediastinal contours. Aorta is tortuous. Pulmonary vascular congestion. Minimal interstitial prominence with bibasilar atelectasis greater on left. No segmental consolidation, pleural effusion or pneumothorax. Surgical clips at right apex.  IMPRESSION: Bibasilar atelectasis greater on left.   Original Report Authenticated By: Ulyses Southward, M.D.    Ct Angio Abd/pel W/ And/or W/o  11/07/2012  *RADIOLOGY REPORT*  Clinical Data:  Abdominal pain, hypotension, fever and chills.  CT ANGIOGRAPHY CHEST, ABDOMEN AND PELVIS  Technique:  Multidetector CT imaging through the chest, abdomen and pelvis was performed using the standard protocol during bolus administration of intravenous contrast.  Multiplanar reconstructed images including MIPs were obtained and reviewed to evaluate the vascular anatomy.  Contrast: , OMNIPAQUE IOHEXOL 350 MG/ML SOLN  Comparison:   None.  CTA CHEST  Findings:  The ascending thoracic aorta is mildly dilated, measuring approximately 4.1 - 4.2 cm in greatest caliber.  This dilatation begins just above the sinotubular junction and continues to nearly the aortic arch.  There is no associated dissection. Correlation suggested with echocardiography electively to exclude significant aortic valvular disease.  Irregular and ulcerated plaque is present at the level of the aortic arch.  Proximal great vessels show no significant occlusive disease.  The descending thoracic aorta is of normal caliber.  The heart size is normal.  Scattered emphysematous bullae are present in the lungs.  Parenchymal scarring and atelectasis present bilaterally.  No focal pulmonary infiltrate, pulmonary edema or pleural fluid is identified.  No bony abnormalities.   Review of the MIP images confirms the above findings.  IMPRESSION:   1.  Mild dilatation of the ascending thoracic aorta, measuring 4.1 - 4.2 cm in greatest caliber.  Recommend elective correlation with echocardiography to exclude significant aortic valvular disease. 2.  Atherosclerotic disease of the aorta.  No aortic dissection is identified.  CTA ABDOMEN AND PELVIS  Findings:  The abdominal aorta shows scattered atherosclerotic disease without evidence of aneurysm or dissection.  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  Mild eccentric narrowing of the proximal SMA is present of approximately 30%.  The inferior mesenteric artery is not well delineated and may be chronically occluded.  Single left renal artery and three separate left renal arteries are present.  There is some atrophy involving the posterior and medial aspect of the left kidney with appearance suggestive of old vascular insult/infarct.  This may be secondary to occlusion of a segmental renal artery branch or separate fourth renal artery in the past.  A patent right sided iliac stent is present in the common iliac artery.  No other evidence of significant occlusive disease involving the iliac arteries or common femoral arteries.  Nonvascular evaluation shows some degree of dilatation of the  entire colon which contains stool and liquid.  Appearance suggests the possibility of enteritis.  No evidence of bowel obstruction or perforation.  No focal abscess is identified.  At least two large gallstones are present.  The gallbladder is not thickened or distended.  No masses, hernias or enlarged lymph nodes are identified.  Degenerative changes are present in the lumbar spine.   Review of the MIP images confirms the above findings.  IMPRESSION:  1.  Atherosclerotic disease of the abdominal aorta and visceral arteries, as above, without evidence of aortic aneurysm or dissection.  There is chronic atrophy of the posterior aspect of the left kidney having an appearance consistent  with prior vascular insult. 2.  Patent right common iliac artery stent 3.  Cholelithiasis without overt CT evidence of cholecystitis 4.  Diffuse dilatation of the colon containing stool and liquid. Findings are suggestive of enteritis.  No associated abscess or small bowel dilatation.   Original Report Authenticated By: Irish Lack, M.D.       Assessment: Acute colitis with signs of systemic toxicity,, possibly ischemic versus infectious versus less likely inflammatory bowel disease. GI pathogen panel pending.  Plan: 1. Continue intensive antibiotic therapy. 2. Will obtain KUB to assess for developing megacolon. 3. Await surgeries input regarding threshold for colectomy. Do not think sigmoidoscopy will add much to clinical assessment but this could be attempted.    Crystal Scarberry C 11/08/2012, 9:22 AM

## 2012-11-08 NOTE — Interval H&P Note (Signed)
History and Physical Interval Note:  11/08/2012 11:07 AM  James [name]  has presented today for surgery, with the diagnosis of /  The various methods of treatment have been discussed with the patient and family. After consideration of risks, benefits and other options for treatment, the patient has consented to  Procedure(s): EXPLORATORY LAPAROTOMY (N/A) as a surgical intervention .  The patient's history has been reviewed, patient examined, no change in status, stable for surgery.  I have reviewed the patient's chart and labs.  Questions were answered to the patient's satisfaction.     Kimisha Eunice A.

## 2012-11-08 NOTE — Transfer of Care (Signed)
Immediate Anesthesia Transfer of Care Note  Patient: James [name]  Procedure(s) Performed: Procedure(s): EXPLORATORY LAPAROTOMY with descending and sigmoid colectomy; wound vac placement and colostomy (N/A)  Patient Location: PACU  Anesthesia Type:General  Level of Consciousness: awake  Airway & Oxygen Therapy: Patient Spontanous Breathing and Patient connected to nasal cannula oxygen  Post-op Assessment: Report given to PACU RN and Post -op Vital signs reviewed and stable  Post vital signs: Reviewed and stable  Complications: No apparent anesthesia complications

## 2012-11-08 NOTE — Op Note (Signed)
James, [name] NO.:  [id number]  MEDICAL RECORD NO.:  [id number]  LOCATION:  2109                         FACILITY:  [facility]  PHYSICIAN:  [name], M.D.DATE OF BIRTH:  08-30-1951  DATE OF PROCEDURE:  11/08/2012 DATE OF DISCHARGE:                              OPERATIVE REPORT   PREOPERATIVE DIAGNOSIS:  Peritonitis with sepsis.  POSTOPERATIVE DIAGNOSIS:  Ischemic colitis involving sigmoid colon and descending colon.  PROCEDURES: 1. Sigmoid colectomy with end colostomy. 2. Mobilization of splenic flexure. 3. Placement of wound VAC.  SURGEON:  Maisie Fus A. James [name], M.D.  ASSISTANT:  Dr. Derrell Lolling.  EBL:  Approximately 400 mL.  SPECIMEN:  Descending colon including the splenic flexure and sigmoid colon to Pathology.  DRAINS:  None.  IV FLUIDS:  Approximately 4500 mL, 3500 mL of crystalloids and rest as colloid.  No blood products given.  INDICATIONS FOR PROCEDURE:  The patient is a 62 year old male admitted yesterday after being in his usual state of health development of abdominal pain and vomiting.  He came in the emergency room, hypotensive.  He was worked up with chest, abdomen, and pelvic CT angiography to exclude dissection, which was none.  He had dilated colon with fluid, but no evidence of free air or any evidence of intra- abdominal inflammatory process.  He was seen by Dr. Carolynne Edouard last night. This morning on rounds, he was having a significant increase in abdominal pain.  On examination, he had signs of peritonitis.  I talked with the family at the bedside about treatment options.  He had a previous C. diff PCR than yesterday, which was negative and he has had no history of antibiotic use.  He does have a history of left iliac stent and had an extensive smoking history up until 10 years ago, and underwent a lobectomy for lung cancer back in 2005 at the [place] in [place]. We discussed the options of treatment with recommended  exploratory laparotomy given his peritoneal signs and tachycardia and what appeared to be worsening of his condition.  On discussion with both sons and his wife about options that could include bowel resection, colostomy, possible open abdomen with multiple reoperations and possible negative laparotomy.  We discussed transfer to tertiary care center after talking out over with the patient and family.  They did not wish to be transferred at this point in time and wished to receive their care here. This was discussed at great length.  Risk of bleeding, infection, death, organ failure, wound complications, open abdomen, hernia formation, colostomy, bowel injury, injury to the ureter, injury to the kidneys, and other intra-abdominal injury discussed.  They understood the potential risks, understood the benefits of surgery and the risk of not doing anything.  They wished to proceed.  DESCRIPTION OF PROCEDURE:  The patient was seen in the holding area. Questions were answered.  He was taken back to the operative room and placed on the operating room table.  Foley catheter was already in place.  He was already on preoperative vancomycin and Flagyl as of yesterday, and Cipro.  After induction of general anesthesia, the abdomen was prepped and draped in a sterile fashion.  Time-out was done.  Midline incision was used and dissection was carried through a very thin attenuated abdominal wall.  Upon entering the abdominal cavity, he had about 400 mL of colloid with some ascites.  A small bowel was then examined after putting a small retractor.  Ligament of Treitz to the ileocecal valve was normal with no signs of ischemia.  The cecum was distended, but viable.  Ascending colon, viable.  Transverse colon viable.  He had a very high splenic flexure.  So, I could not initially see this splenic flexure.  I think in the descending colon, there were areas of patchy what appeared to be necrosis starting in  the middescending colon and extending through the sigmoid colon to the rectosigmoid junction.  These areas were very patchy and looked to be more embolic, but  the colon had not perforated, but these areas were very thin and perforation was eminent in the next 12 hours.  I was then able to make a larger incision and placed a larger retractor in place and then I mobilized the splenic flexure down to examine it.  It appeared to be relatively viable.  Did not see any areas of necrosis, but  it was somewhat boggy and dilated to about 4-5 cm, but was viable. After doing this, I elected to go ahead and resect from this splenic flexure through the sigmoid colon since these areas were patchy.  The rectum itself looked viable.  Using a GIA 75 stapling device, we divided the transverse colon just proximal to the splenic flexure.  This was taken down using cautery.  There were some oozing from the capsule of the spleen controlled with Surgicel, Evicel and pressure.  I then finished mobilizing the splenic flexure and mobilized the colon from its retroperitoneal attachments.  The ureter was identified and preserved. There was a palpable stent in his left iliac artery.  After mobilizing the remainder of the colon down to the rectosigmoid junction, the most distal area of necrosis was at the distal sigmoid.  We fired a second load at the GIA 75 stapling device there.  We then after mobilizing this was able to take the LigaSure and take the mesentery down and resected this part of the colon.  This was passed off the field.  We examined the retroperitoneum.  The ureter was intact and was not divided or obstructed.  The spleen was reexamined and hemostasis was excellent.  I reexamined the remainder of the transverse colon and ascending colon the second time and saw no additional areas of ischemia.  Again, the small bowels run a second time was normal.  Stomach was normal.  Liver was normal.  At this point  in time, the left upper quadrant colostomy was constructed.  Cover was used to grab the fascia.  Curvilinear incision was made in the left upper quadrant through the skin.  We were then able to make a vertical incision through the fascia of the rectus muscle and pulled the colon out through this.  It was appeared viable and there was adequate length to do that.  Abdominal cavity was irrigated.  Excess irrigation was suctioned out.  The fascia was then closed with running double-stranded PDS suture.  I elected to leave the skin open and placed a wound VAC.  Colostomy was matured using 3-0 Vicryl.  Upon opening the colon, the wall was viable.  The mucosa though looked dark and shaggy consistent with colitis.  This did have some features of C. diff colitis, even though,  he had a negative PCR.  I will go ahead and repeat this, but the colon itself was viable and something that could be watch. The edges bled well, this appeared to be a good blood supply of this area.  Appliance was then placed after maturation with a 3-0 Vicryl.  He was taken back to the intensive care unit in stable condition.  All final counts of sponge, needle, and instruments were found to be correct.  EBL about 400 mL.     Trampas Stettner A. Draylen Lobue, M.D.     TAC/MEDQ  D:  11/08/2012  T:  11/08/2012  Job:  [id number]

## 2012-11-09 ENCOUNTER — Inpatient Hospital Stay (HOSPITAL_COMMUNITY): Payer: BC Managed Care – PPO

## 2012-11-09 DIAGNOSIS — I4891 Unspecified atrial fibrillation: Secondary | ICD-10-CM

## 2012-11-09 LAB — CBC
Platelets: 227 10*3/uL (ref 150–400)
RDW: 14.2 % (ref 11.5–15.5)
WBC: 12.2 10*3/uL — ABNORMAL HIGH (ref 4.0–10.5)

## 2012-11-09 LAB — GLUCOSE, CAPILLARY
Glucose-Capillary: 103 mg/dL — ABNORMAL HIGH (ref 70–99)
Glucose-Capillary: 86 mg/dL (ref 70–99)
Glucose-Capillary: 95 mg/dL (ref 70–99)

## 2012-11-09 LAB — TROPONIN I: Troponin I: <0.3 ng/mL (ref <0.30)

## 2012-11-09 LAB — COMPREHENSIVE METABOLIC PANEL
Alkaline Phosphatase: 120 U/L — ABNORMAL HIGH (ref 39–117)
BUN: 22 mg/dL (ref 6–23)
Chloride: 106 mEq/L (ref 96–112)
GFR calc Af Amer: 80 mL/min — ABNORMAL LOW (ref >90)
GFR calc non Af Amer: 69 mL/min — ABNORMAL LOW (ref >90)
Glucose, Bld: 115 mg/dL — ABNORMAL HIGH (ref 70–99)
Potassium: 3.6 mEq/L (ref 3.5–5.1)
Total Bilirubin: 0.3 mg/dL (ref 0.3–1.2)
Total Protein: 5.1 g/dL — ABNORMAL LOW (ref 6.0–8.3)

## 2012-11-09 MED ORDER — DILTIAZEM LOAD VIA INFUSION
10.0000 mg | Freq: Once | INTRAVENOUS | Status: AC
  Administered 2012-11-09: 10 mg via INTRAVENOUS
  Filled 2012-11-09: qty 10

## 2012-11-09 MED ORDER — DILTIAZEM HCL 25 MG/5ML IV SOLN: 10.0000 mg | Freq: Once | INTRAVENOUS | Status: DC

## 2012-11-09 MED ORDER — DIPHENHYDRAMINE HCL 12.5 MG/5ML PO ELIX: 12.5000 mg | ORAL_SOLUTION | Freq: Four times a day (QID) | ORAL | Status: DC | PRN

## 2012-11-09 MED ORDER — DIPHENHYDRAMINE HCL 12.5 MG/5ML PO ELIX
12.5000 mg | ORAL_SOLUTION | Freq: Four times a day (QID) | ORAL | Status: DC | PRN
  Filled 2012-11-09: qty 5

## 2012-11-09 MED ORDER — NALOXONE HCL 0.4 MG/ML IJ SOLN: 0.4000 mg | INTRAMUSCULAR | Status: DC | PRN

## 2012-11-09 MED ORDER — SODIUM CHLORIDE 0.9 % IJ SOLN
9.0000 mL | INTRAMUSCULAR | Status: DC | PRN
  Administered 2012-11-09: 3 mL via INTRAVENOUS

## 2012-11-09 MED ORDER — DILTIAZEM HCL 100 MG IV SOLR
5.0000 mg/h | INTRAVENOUS | Status: DC
  Administered 2012-11-09: 7 mg/h via INTRAVENOUS
  Administered 2012-11-09: 5 mg/h via INTRAVENOUS
  Filled 2012-11-09 (×2): qty 100

## 2012-11-09 MED ORDER — SODIUM CHLORIDE 0.9 % IJ SOLN: 9.0000 mL | INTRAMUSCULAR | Status: DC | PRN

## 2012-11-09 MED ORDER — LEVALBUTEROL HCL 0.63 MG/3ML IN NEBU
0.6300 mg | INHALATION_SOLUTION | Freq: Four times a day (QID) | RESPIRATORY_TRACT | Status: DC
  Administered 2012-11-09 – 2012-11-13 (×18): 0.63 mg via RESPIRATORY_TRACT
  Filled 2012-11-09 (×22): qty 3

## 2012-11-09 MED ORDER — LEVALBUTEROL HCL 0.63 MG/3ML IN NEBU
0.6300 mg | INHALATION_SOLUTION | RESPIRATORY_TRACT | Status: DC | PRN
  Filled 2012-11-09: qty 3

## 2012-11-09 MED ORDER — ONDANSETRON HCL 4 MG/2ML IJ SOLN: 4.0000 mg | Freq: Four times a day (QID) | INTRAMUSCULAR | Status: DC | PRN

## 2012-11-09 MED ORDER — HYDROMORPHONE 0.3 MG/ML IV SOLN
INTRAVENOUS | Status: DC
  Administered 2012-11-09: 0.3 mg via INTRAVENOUS
  Administered 2012-11-09: 6 mg via INTRAVENOUS
  Administered 2012-11-09: 1.2 mg via INTRAVENOUS
  Administered 2012-11-10 (×2): 0.3 mg via INTRAVENOUS
  Administered 2012-11-10: 2 mg via INTRAVENOUS

## 2012-11-09 MED ORDER — HYDROMORPHONE 0.3 MG/ML IV SOLN: INTRAVENOUS | Status: DC

## 2012-11-09 MED ORDER — DIPHENHYDRAMINE HCL 50 MG/ML IJ SOLN: 12.5000 mg | Freq: Four times a day (QID) | INTRAMUSCULAR | Status: DC | PRN

## 2012-11-09 NOTE — Progress Notes (Signed)
0900 Bath administered.  Out of bed to chair, foley catheter removed.   1000 Family visiting.

## 2012-11-09 NOTE — Progress Notes (Signed)
PULMONARY  / CRITICAL CARE MEDICINE  Name: James [name] MRN: [id number] DOB: May 12, 1951    ADMISSION DATE:  11/07/2012  REFERRING MD :  EDP  CHIEF COMPLAINT:  ABD Pain, nausea, vomiting, diarrhea  BRIEF PATIENT DESCRIPTION:  63 y/o WM with PMH of COPD, Lung cancer s/p resection at [facility] who presented to [facility] with acute onset abdominal pain with associated n/v/d.  Tx to [facility] ICU for further eval.    SIGNIFICANT EVENTS / STUDIES:  2/7 - CT ABD / Pelvis>>>Atherosclerotic disease of the abdominal aorta and visceral arteries, as above, without evidence of aortic aneurysm or dissection. There is chronic atrophy of the posterior aspect of the left kidney having an appearance consistent with prior vascular insult. Patent right common iliac artery stent  Cholelithiasis without overt CT evidence of cholecystitis  Diffuse dilatation of the colon containing stool and liquid. Findings are suggestive of enteritis. No associated abscess or small bowel dilatation. 2/8 descending/sigmoid colectomy with wound vac placement 2/9 A fib with RVR   LINES / TUBES: R IJ TLC 2/7>>>  CULTURES: BCx2 2/7>>> Stool Culture>>> Cdiff PCR>>>negative Urine 2/7 >>> negative  ANTIBIOTICS: Flagyl 2/7>>> Cipro 2/7>>> Vanco PO 2/7>>>2/8  SUBJECTIVE:  A fib with RVR over night.  Breathing okay.  Denies chest pain.  VITAL SIGNS: Temp:  [98.8 F (37.1 C)-102.5 F (39.2 C)] 99.6 F (37.6 C) (02/09 0700) Pulse Rate:  [63-137] 115 (02/09 0700) Resp:  [10-22] 14 (02/09 0808) BP: (81-127)/(41-83) 102/59 mmHg (02/09 0700) SpO2:  [91 %-100 %] 100 % (02/09 0808) Weight:  [210 lb 1.6 oz (95.3 kg)] 210 lb 1.6 oz (95.3 kg) (02/09 0446)  HEMODYNAMICS: CVP:  [6 mmHg-15 mmHg] 12 mmHg  INTAKE / OUTPUT: Intake/Output     02/08 0701 - 02/09 0700 02/09 0701 - 02/10 0700   I.V. (mL/kg) 3518.5 (36.9)    IV Piggyback 1350    Total Intake(mL/kg) 4868.5 (51.1)    Urine (mL/kg/hr) 1930 (0.8)    Emesis/NG output 200  (0.1)    Stool 160 (0.1)    Blood 300 (0.1)    Total Output 2590     Net +2278.5          Emesis Occurrence 200 x      PHYSICAL EXAMINATION: General: Ill appearing Neuro:  Alert, mildly confused HEENT: NG tube in place Cardiovascular:  s1s2 tachycardic, irregular Lungs: decreased BS, no wheeze Abdomen:  Colostomy LLQ, wound dressing clean Musculoskeletal:  No edema Skin: no rashes  LABS:  Recent Labs Lab 11/07/12 1220 11/07/12 1839 11/07/12 1841 11/08/12 0505 11/08/12 1338 11/08/12 1930 11/09/12 0142 11/09/12 0415  HGB 16.4  --   --  11.6* 11.6* 9.7* 9.3*  --   WBC 25.9*  --   --  18.3*  --  12.8* 12.2*  --   PLT 421*  --   --  290  --  244 227  --   NA 133*  --   --  138 139 136 137  --   K 4.1  --   --  3.6 3.6 3.6 3.6  --   CL 93*  --   --  107  --  105 106  --   CO2 24  --   --  20  --  25 24  --   GLUCOSE 163*  --   --  131* 138* 168* 115*  --   BUN 27*  --   --  22  --  22 22  --  CREATININE 1.20  --   --  1.18  --  1.13 1.11  --   CALCIUM 10.2  --   --  7.7*  --  7.5* 7.7*  --   MG  --   --   --   --   --   --  1.7  --   AST 26  --   --  18  --  19 23  --   ALT 20  --   --  13  --  13 15  --   ALKPHOS 299*  --   --  218*  --  129* 120*  --   BILITOT 0.4  --   --  0.5  --  0.4 0.3  --   PROT 9.0*  --   --  5.9*  --  5.0* 5.1*  --   ALBUMIN 4.6  --   --  2.8*  --  2.4* 2.4*  --   APTT 37  --   --   --   --   --   --   --   INR 0.98  --   --   --   --   --   --   --   LATICACIDVEN 1.4 1.9  --   --   --   --   --   --   TROPONINI <0.30  --   --   --   --   --   --  <0.30  PROCALCITON  --   --  1.07  --   --   --   --   --     Recent Labs Lab 11/07/12 2003 11/08/12 1942 11/09/12 11/09/12 0405 11/09/12 0721  GLUCAP 123* 124* 114* 103* 86    Imaging: Ct Angio Chest Pe W/cm &/or Wo Cm  11/07/2012  *RADIOLOGY REPORT*  Clinical Data:  Abdominal pain, hypotension, fever and chills.  CT ANGIOGRAPHY CHEST, ABDOMEN AND PELVIS  Technique:  Multidetector CT  imaging through the chest, abdomen and pelvis was performed using the standard protocol during bolus administration of intravenous contrast.  Multiplanar reconstructed images including MIPs were obtained and reviewed to evaluate the vascular anatomy.  Contrast: , OMNIPAQUE IOHEXOL 350 MG/ML SOLN  Comparison:   None.  CTA CHEST  Findings:  The ascending thoracic aorta is mildly dilated, measuring approximately 4.1 - 4.2 cm in greatest caliber.  This dilatation begins just above the sinotubular junction and continues to nearly the aortic arch.  There is no associated dissection. Correlation suggested with echocardiography electively to exclude significant aortic valvular disease.  Irregular and ulcerated plaque is present at the level of the aortic arch.  Proximal great vessels show no significant occlusive disease.  The descending thoracic aorta is of normal caliber.  The heart size is normal.  Scattered emphysematous bullae are present in the lungs.  Parenchymal scarring and atelectasis present bilaterally.  No focal pulmonary infiltrate, pulmonary edema or pleural fluid is identified.  No bony abnormalities.   Review of the MIP images confirms the above findings.  IMPRESSION:  1.  Mild dilatation of the ascending thoracic aorta, measuring 4.1 - 4.2 cm in greatest caliber.  Recommend elective correlation with echocardiography to exclude significant aortic valvular disease. 2.  Atherosclerotic disease of the aorta.  No aortic dissection is identified.  CTA ABDOMEN AND PELVIS  Findings:  The abdominal aorta shows scattered atherosclerotic disease without evidence of aneurysm or dissection.  Mild stenosis at the origin  of the celiac axis present of approximately 40% narrowing.  Mild eccentric narrowing of the proximal SMA is present of approximately 30%.  The inferior mesenteric artery is not well delineated and may be chronically occluded.  Single left renal artery and three separate left renal arteries are  present.  There is some atrophy involving the posterior and medial aspect of the left kidney with appearance suggestive of old vascular insult/infarct.  This may be secondary to occlusion of a segmental renal artery branch or separate fourth renal artery in the past.  A patent right sided iliac stent is present in the common iliac artery.  No other evidence of significant occlusive disease involving the iliac arteries or common femoral arteries.  Nonvascular evaluation shows some degree of dilatation of the entire colon which contains stool and liquid.  Appearance suggests the possibility of enteritis.  No evidence of bowel obstruction or perforation.  No focal abscess is identified.  At least two large gallstones are present.  The gallbladder is not thickened or distended.  No masses, hernias or enlarged lymph nodes are identified.  Degenerative changes are present in the lumbar spine.   Review of the MIP images confirms the above findings.  IMPRESSION:  1.  Atherosclerotic disease of the abdominal aorta and visceral arteries, as above, without evidence of aortic aneurysm or dissection.  There is chronic atrophy of the posterior aspect of the left kidney having an appearance consistent with prior vascular insult. 2.  Patent right common iliac artery stent 3.  Cholelithiasis without overt CT evidence of cholecystitis 4.  Diffuse dilatation of the colon containing stool and liquid. Findings are suggestive of enteritis.  No associated abscess or small bowel dilatation.   Original Report Authenticated By: Irish Lack, M.D.    Dg Chest Port 1 View  11/09/2012  *RADIOLOGY REPORT*  Clinical Data: Follow-up atelectasis  PORTABLE CHEST - 1 VIEW  Comparison: Chest radiograph to 03/2013  Findings: Right central venous line is unchanged.  Interval introduction of NG tube with tip in the gastric fundus.  Stable cardiac silhouette.  There is left lower lobe atelectasis with small effusion not changed.  No evidence of  pulmonary edema.  Low lung volumes.  IMPRESSION: 1.  Interval introduction of an NG tube. 2.  Low lung volumes and left basilar atelectasis.   Original Report Authenticated By: Genevive Bi, M.D.    Dg Chest Port 1 View  11/07/2012  *RADIOLOGY REPORT*  Clinical Data: Central line placement  PORTABLE CHEST - 1 VIEW  Comparison: Portable chest x-ray of 11/07/2012  Findings: The right IJ central venous line tip overlies the lower SVC.  No pneumothorax is seen.  Basilar atelectasis is noted left greater than right and developing pneumonia at the left lung base cannot be excluded.  The heart is mildly enlarged.  No acute skeletal abnormality is seen.  IMPRESSION:  1.  Right IJ central venous line tip overlies the lower SVC. 2.  Prominent markings at the lung bases left greater than right most consistent with atelectasis.  Cannot exclude developing pneumonia at the left lung base.   Original Report Authenticated By: Dwyane Dee, M.D.    Dg Chest Port 1 View  11/07/2012  *RADIOLOGY REPORT*  Clinical Data: Hypotension, abdominal pain, chills, lethargy, history lung cancer, COPD, hypertension  PORTABLE CHEST - 1 VIEW  Comparison: Portable exam 1248 hours without priors for comparison  Findings: Normal heart size and mediastinal contours. Aorta is tortuous. Pulmonary vascular congestion. Minimal interstitial prominence with bibasilar  atelectasis greater on left. No segmental consolidation, pleural effusion or pneumothorax. Surgical clips at right apex.  IMPRESSION: Bibasilar atelectasis greater on left.   Original Report Authenticated By: Ulyses Southward, M.D.    Dg Abd Portable 1v  11/08/2012  *RADIOLOGY REPORT*  Clinical Data: Abdominal distention  PORTABLE ABDOMEN - 1 VIEW  Comparison: None.  Findings: Supine portable views of the abdomen show distended small bowel loops measuring up to 4.2 cm in diameter.  No distention of the colon is seen, and these findings are most consistent with a partial small bowel obstruction.   Free air cannot be assessed on portable supine views, with an erect view being necessary.  IMPRESSION: Suspect partial small bowel obstruction.  Cannot assess free air on portable supine view.   Original Report Authenticated By: Dwyane Dee, M.D.    Ct Angio Abd/pel W/ And/or W/o  11/07/2012  *RADIOLOGY REPORT*  Clinical Data:  Abdominal pain, hypotension, fever and chills.  CT ANGIOGRAPHY CHEST, ABDOMEN AND PELVIS  Technique:  Multidetector CT imaging through the chest, abdomen and pelvis was performed using the standard protocol during bolus administration of intravenous contrast.  Multiplanar reconstructed images including MIPs were obtained and reviewed to evaluate the vascular anatomy.  Contrast: , OMNIPAQUE IOHEXOL 350 MG/ML SOLN  Comparison:   None.  CTA CHEST  Findings:  The ascending thoracic aorta is mildly dilated, measuring approximately 4.1 - 4.2 cm in greatest caliber.  This dilatation begins just above the sinotubular junction and continues to nearly the aortic arch.  There is no associated dissection. Correlation suggested with echocardiography electively to exclude significant aortic valvular disease.  Irregular and ulcerated plaque is present at the level of the aortic arch.  Proximal great vessels show no significant occlusive disease.  The descending thoracic aorta is of normal caliber.  The heart size is normal.  Scattered emphysematous bullae are present in the lungs.  Parenchymal scarring and atelectasis present bilaterally.  No focal pulmonary infiltrate, pulmonary edema or pleural fluid is identified.  No bony abnormalities.   Review of the MIP images confirms the above findings.  IMPRESSION:  1.  Mild dilatation of the ascending thoracic aorta, measuring 4.1 - 4.2 cm in greatest caliber.  Recommend elective correlation with echocardiography to exclude significant aortic valvular disease. 2.  Atherosclerotic disease of the aorta.  No aortic dissection is identified.  CTA ABDOMEN AND  PELVIS  Findings:  The abdominal aorta shows scattered atherosclerotic disease without evidence of aneurysm or dissection.  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  Mild eccentric narrowing of the proximal SMA is present of approximately 30%.  The inferior mesenteric artery is not well delineated and may be chronically occluded.  Single left renal artery and three separate left renal arteries are present.  There is some atrophy involving the posterior and medial aspect of the left kidney with appearance suggestive of old vascular insult/infarct.  This may be secondary to occlusion of a segmental renal artery branch or separate fourth renal artery in the past.  A patent right sided iliac stent is present in the common iliac artery.  No other evidence of significant occlusive disease involving the iliac arteries or common femoral arteries.  Nonvascular evaluation shows some degree of dilatation of the entire colon which contains stool and liquid.  Appearance suggests the possibility of enteritis.  No evidence of bowel obstruction or perforation.  No focal abscess is identified.  At least two large gallstones are present.  The gallbladder is not thickened or distended.  No masses, hernias or enlarged lymph nodes are identified.  Degenerative changes are present in the lumbar spine.   Review of the MIP images confirms the above findings.  IMPRESSION:  1.  Atherosclerotic disease of the abdominal aorta and visceral arteries, as above, without evidence of aortic aneurysm or dissection.  There is chronic atrophy of the posterior aspect of the left kidney having an appearance consistent with prior vascular insult. 2.  Patent right common iliac artery stent 3.  Cholelithiasis without overt CT evidence of cholecystitis 4.  Diffuse dilatation of the colon containing stool and liquid. Findings are suggestive of enteritis.  No associated abscess or small bowel dilatation.   Original  Report Authenticated By: Irish Lack, M.D.      ASSESSMENT / PLAN:  PULMONARY A: Lower lobe atelectasis - in setting of pain / abdominal splinting COPD - on symbicort at baseline  P:   -scheduled BD's >> change to xopenex due to tachycardia -bronchial hygiene -oxygen to keep SpO2 > 92% -f/u CXR as needed  CARDIOVASCULAR A:  Hypovolemic Shock - in setting of ischemic colitis; resolved Swollen upper lip - eyelid and lip edema, no tongue involvement, unclear etiology; improved 2/8 Atrial Fibrillation  - noted in [facility], and again 2/9  P:  -fluids to keep CVP > 6 -cardizem gtt  RENAL A:   Hyponatremia, Hypochloremia >> resolved.  P:   -f/u BMET   GASTROINTESTINAL A:   Ischemic colitis s/p laparotomy 2/8.  P:   -post op care, nutrition per [facility]  HEMATOLOGIC A:   Leukocytosis >> improved. Anemia of critical illness.  P:  -f/u CBC -Transfuse for Hb < 7  INFECTIOUS A:   N/V/D with associated Acute ABD Pain  P:   -abx as above  ENDOCRINE A:   Hyperglycemia - noted on initial BMP  P:   -monitor BMP glucose  NEUROLOGIC A:   Pain   P:   -Dilaudid PCA  Updated son at bedside.  CC time 35 minutes.  Coralyn Helling, MD [facility] Pulmonary/Critical Care 11/09/2012, 8:48 AM Pager:  [phone] After 3pm call: [phone]

## 2012-11-09 NOTE — Progress Notes (Signed)
eLink Physician-Brief Progress Note Patient Name: James [name] DOB: 1951/03/27 MRN: [id number]  Date of Service  11/09/2012   HPI/Events of Note  Patient with h/o of paroxysmal AF/RVR now s/p abd surgery.  HR now in the 150s with BP of 127/54 (72).  Currently on dilaudid PCA and some concern if pain is adequately controlled.     eICU Interventions  Plan: Bolus and start diltiazem gtt (has allergy to Norvasc listed).   May need to adjust dilaudid PCA for adequate pain control - this was just started 30 minutes ago.   Intervention Category Intermediate Interventions: Arrhythmia - evaluation and management  DETERDING,ELIZABETH 11/09/2012, 12:32 AM

## 2012-11-09 NOTE — Progress Notes (Signed)
1 Day Post-Op  Subjective: Awake and alert. Pt says he feels better. He is asking whether he could go to a [place]  Objective: Vital signs in last 24 hours: Temp:  [98.8 F (37.1 C)-102.5 F (39.2 C)] 99.8 F (37.7 C) (02/09 0600) Pulse Rate:  [63-137] 113 (02/09 0600) Resp:  [10-22] 11 (02/09 0600) BP: (81-127)/(41-83) 103/64 mmHg (02/09 0600) SpO2:  [91 %-98 %] 95 % (02/09 0600) Weight:  [210 lb 1.6 oz (95.3 kg)] 210 lb 1.6 oz (95.3 kg) (02/09 0446) Last BM Date: 11/08/12  Intake/Output from previous day: 02/08 0701 - 02/09 0700 In: 4868.5 [I.V.:3518.5; IV Piggyback:1350] Out: 2590 [Urine:1930; Emesis/NG output:200; Stool:160; Blood:300] Intake/Output this shift:    GI: softer and less distended. appropriately tender. ostomy dusky but not dead  Lab Results:   Recent Labs  11/08/12 1930 11/09/12 0142  WBC 12.8* 12.2*  HGB 9.7* 9.3*  HCT 28.6* 27.6*  PLT 244 227   BMET  Recent Labs  11/08/12 1930 11/09/12 0142  NA 136 137  K 3.6 3.6  CL 105 106  CO2 25 24  GLUCOSE 168* 115*  BUN 22 22  CREATININE 1.13 1.11  CALCIUM 7.5* 7.7*   PT/INR  Recent Labs  11/07/12 1220  LABPROT 12.9  INR 0.98   ABG No results found for this basename: PHART, PCO2, PO2, HCO3,  in the last 72 hours  Studies/Results: Ct Angio Chest Pe W/cm &/or Wo Cm  11/07/2012  *RADIOLOGY REPORT*  Clinical Data:  Abdominal pain, hypotension, fever and chills.  CT ANGIOGRAPHY CHEST, ABDOMEN AND PELVIS  Technique:  Multidetector CT imaging through the chest, abdomen and pelvis was performed using the standard protocol during bolus administration of intravenous contrast.  Multiplanar reconstructed images including MIPs were obtained and reviewed to evaluate the vascular anatomy.  Contrast: , OMNIPAQUE IOHEXOL 350 MG/ML SOLN  Comparison:   None.  CTA CHEST  Findings:  The ascending thoracic aorta is mildly dilated, measuring approximately 4.1 - 4.2 cm in greatest caliber.  This dilatation begins just  above the sinotubular junction and continues to nearly the aortic arch.  There is no associated dissection. Correlation suggested with echocardiography electively to exclude significant aortic valvular disease.  Irregular and ulcerated plaque is present at the level of the aortic arch.  Proximal great vessels show no significant occlusive disease.  The descending thoracic aorta is of normal caliber.  The heart size is normal.  Scattered emphysematous bullae are present in the lungs.  Parenchymal scarring and atelectasis present bilaterally.  No focal pulmonary infiltrate, pulmonary edema or pleural fluid is identified.  No bony abnormalities.   Review of the MIP images confirms the above findings.  IMPRESSION:  1.  Mild dilatation of the ascending thoracic aorta, measuring 4.1 - 4.2 cm in greatest caliber.  Recommend elective correlation with echocardiography to exclude significant aortic valvular disease. 2.  Atherosclerotic disease of the aorta.  No aortic dissection is identified.  CTA ABDOMEN AND PELVIS  Findings:  The abdominal aorta shows scattered atherosclerotic disease without evidence of aneurysm or dissection.  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  Mild eccentric narrowing of the proximal SMA is present of approximately 30%.  The inferior mesenteric artery is not well delineated and may be chronically occluded.  Single left renal artery and three separate left renal arteries are present.  There is some atrophy involving the posterior and medial aspect of the left kidney with appearance suggestive of  old vascular insult/infarct.  This may be secondary to occlusion of a segmental renal artery branch or separate fourth renal artery in the past.  A patent right sided iliac stent is present in the common iliac artery.  No other evidence of significant occlusive disease involving the iliac arteries or common femoral arteries.  Nonvascular evaluation shows some  degree of dilatation of the entire colon which contains stool and liquid.  Appearance suggests the possibility of enteritis.  No evidence of bowel obstruction or perforation.  No focal abscess is identified.  At least two large gallstones are present.  The gallbladder is not thickened or distended.  No masses, hernias or enlarged lymph nodes are identified.  Degenerative changes are present in the lumbar spine.   Review of the MIP images confirms the above findings.  IMPRESSION:  1.  Atherosclerotic disease of the abdominal aorta and visceral arteries, as above, without evidence of aortic aneurysm or dissection.  There is chronic atrophy of the posterior aspect of the left kidney having an appearance consistent with prior vascular insult. 2.  Patent right common iliac artery stent 3.  Cholelithiasis without overt CT evidence of cholecystitis 4.  Diffuse dilatation of the colon containing stool and liquid. Findings are suggestive of enteritis.  No associated abscess or small bowel dilatation.   Original Report Authenticated By: Irish Lack, M.D.    Dg Chest Port 1 View  11/09/2012  *RADIOLOGY REPORT*  Clinical Data: Follow-up atelectasis  PORTABLE CHEST - 1 VIEW  Comparison: Chest radiograph to 03/2013  Findings: Right central venous line is unchanged.  Interval introduction of NG tube with tip in the gastric fundus.  Stable cardiac silhouette.  There is left lower lobe atelectasis with small effusion not changed.  No evidence of pulmonary edema.  Low lung volumes.  IMPRESSION: 1.  Interval introduction of an NG tube. 2.  Low lung volumes and left basilar atelectasis.   Original Report Authenticated By: Genevive Bi, M.D.    Dg Chest Port 1 View  11/07/2012  *RADIOLOGY REPORT*  Clinical Data: Central line placement  PORTABLE CHEST - 1 VIEW  Comparison: Portable chest x-ray of 11/07/2012  Findings: The right IJ central venous line tip overlies the lower SVC.  No pneumothorax is seen.  Basilar atelectasis is  noted left greater than right and developing pneumonia at the left lung base cannot be excluded.  The heart is mildly enlarged.  No acute skeletal abnormality is seen.  IMPRESSION:  1.  Right IJ central venous line tip overlies the lower SVC. 2.  Prominent markings at the lung bases left greater than right most consistent with atelectasis.  Cannot exclude developing pneumonia at the left lung base.   Original Report Authenticated By: Dwyane Dee, M.D.    Dg Chest Port 1 View  11/07/2012  *RADIOLOGY REPORT*  Clinical Data: Hypotension, abdominal pain, chills, lethargy, history lung cancer, COPD, hypertension  PORTABLE CHEST - 1 VIEW  Comparison: Portable exam 1248 hours without priors for comparison  Findings: Normal heart size and mediastinal contours. Aorta is tortuous. Pulmonary vascular congestion. Minimal interstitial prominence with bibasilar atelectasis greater on left. No segmental consolidation, pleural effusion or pneumothorax. Surgical clips at right apex.  IMPRESSION: Bibasilar atelectasis greater on left.   Original Report Authenticated By: Ulyses Southward, M.D.    Dg Abd Portable 1v  11/08/2012  *RADIOLOGY REPORT*  Clinical Data: Abdominal distention  PORTABLE ABDOMEN - 1 VIEW  Comparison: None.  Findings: Supine portable views of the abdomen show distended  small bowel loops measuring up to 4.2 cm in diameter.  No distention of the colon is seen, and these findings are most consistent with a partial small bowel obstruction.  Free air cannot be assessed on portable supine views, with an erect view being necessary.  IMPRESSION: Suspect partial small bowel obstruction.  Cannot assess free air on portable supine view.   Original Report Authenticated By: Dwyane Dee, M.D.    Ct Angio Abd/pel W/ And/or W/o  11/07/2012  *RADIOLOGY REPORT*  Clinical Data:  Abdominal pain, hypotension, fever and chills.  CT ANGIOGRAPHY CHEST, ABDOMEN AND PELVIS  Technique:  Multidetector CT imaging through the chest, abdomen and  pelvis was performed using the standard protocol during bolus administration of intravenous contrast.  Multiplanar reconstructed images including MIPs were obtained and reviewed to evaluate the vascular anatomy.  Contrast: , OMNIPAQUE IOHEXOL 350 MG/ML SOLN  Comparison:   None.  CTA CHEST  Findings:  The ascending thoracic aorta is mildly dilated, measuring approximately 4.1 - 4.2 cm in greatest caliber.  This dilatation begins just above the sinotubular junction and continues to nearly the aortic arch.  There is no associated dissection. Correlation suggested with echocardiography electively to exclude significant aortic valvular disease.  Irregular and ulcerated plaque is present at the level of the aortic arch.  Proximal great vessels show no significant occlusive disease.  The descending thoracic aorta is of normal caliber.  The heart size is normal.  Scattered emphysematous bullae are present in the lungs.  Parenchymal scarring and atelectasis present bilaterally.  No focal pulmonary infiltrate, pulmonary edema or pleural fluid is identified.  No bony abnormalities.   Review of the MIP images confirms the above findings.  IMPRESSION:  1.  Mild dilatation of the ascending thoracic aorta, measuring 4.1 - 4.2 cm in greatest caliber.  Recommend elective correlation with echocardiography to exclude significant aortic valvular disease. 2.  Atherosclerotic disease of the aorta.  No aortic dissection is identified.  CTA ABDOMEN AND PELVIS  Findings:  The abdominal aorta shows scattered atherosclerotic disease without evidence of aneurysm or dissection.  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  Mild eccentric narrowing of the proximal SMA is present of approximately 30%.  The inferior mesenteric artery is not well delineated and may be chronically occluded.  Single left renal artery and three separate left renal arteries are present.  There is some atrophy involving  the posterior and medial aspect of the left kidney with appearance suggestive of old vascular insult/infarct.  This may be secondary to occlusion of a segmental renal artery branch or separate fourth renal artery in the past.  A patent right sided iliac stent is present in the common iliac artery.  No other evidence of significant occlusive disease involving the iliac arteries or common femoral arteries.  Nonvascular evaluation shows some degree of dilatation of the entire colon which contains stool and liquid.  Appearance suggests the possibility of enteritis.  No evidence of bowel obstruction or perforation.  No focal abscess is identified.  At least two large gallstones are present.  The gallbladder is not thickened or distended.  No masses, hernias or enlarged lymph nodes are identified.  Degenerative changes are present in the lumbar spine.   Review of the MIP images confirms the above findings.  IMPRESSION:  1.  Atherosclerotic disease of the abdominal aorta and visceral arteries, as above, without evidence of aortic aneurysm or dissection.  There is chronic atrophy of the  posterior aspect of the left kidney having an appearance consistent with prior vascular insult. 2.  Patent right common iliac artery stent 3.  Cholelithiasis without overt CT evidence of cholecystitis 4.  Diffuse dilatation of the colon containing stool and liquid. Findings are suggestive of enteritis.  No associated abscess or small bowel dilatation.   Original Report Authenticated By: Irish Lack, M.D.     Anti-infectives: Anti-infectives   Start     Dose/Rate Route Frequency Ordered Stop   11/07/12 1800  vancomycin (VANCOCIN) 50 mg/mL oral solution 500 mg  Status:  Discontinued     500 mg Oral 4 times per day 11/07/12 1649 11/08/12 1934   11/07/12 1800  metroNIDAZOLE (FLAGYL) IVPB 500 mg     500 mg 100 mL/hr over 60 Minutes Intravenous 4 times per day 11/07/12 1649     11/07/12 1700  ciprofloxacin (CIPRO) IVPB 400 mg      400 mg 200 mL/hr over 60 Minutes Intravenous Every 12 hours 11/07/12 1649     11/07/12 1336  ceFEPIme (MAXIPIME) 1 G injection    Comments:  HENSON, VICKIE: cabinet override      11/07/12 1336 11/07/12 1356   11/07/12 1230  vancomycin (VANCOCIN) IVPB 1000 mg/200 mL premix     1,000 mg 200 mL/hr over 60 Minutes Intravenous  Once 11/07/12 1226 11/07/12 1515   11/07/12 1230  ceFEPIme (MAXIPIME) 1 g in dextrose 5 % 50 mL IVPB  Status:  Discontinued     1 g 100 mL/hr over 30 Minutes Intravenous  Once 11/07/12 1226 11/07/12 1736      Assessment/Plan: s/p Procedure(s): EXPLORATORY LAPAROTOMY with descending and sigmoid colectomy; wound vac placement and colostomy (N/A) Continue abx therapy to include C. Diff Continue bowel rest and NG Critical care per CCM  LOS: 2 days    TOTH III,PAUL S 11/09/2012

## 2012-11-09 NOTE — Progress Notes (Signed)
0440 No urine noted.  Patient states he does not feel that he needs to urinate.  Bladder scan done (per Canary Brim, NP) 508 cc noted.  Dr. Craige Cotta aware.  Instructed to replace foley catheter.

## 2012-11-10 ENCOUNTER — Inpatient Hospital Stay (HOSPITAL_COMMUNITY): Payer: BC Managed Care – PPO

## 2012-11-10 LAB — BASIC METABOLIC PANEL
BUN: 20 mg/dL (ref 6–23)
Creatinine, Ser: 0.93 mg/dL (ref 0.50–1.35)
GFR calc non Af Amer: 88 mL/min — ABNORMAL LOW (ref >90)
Glucose, Bld: 101 mg/dL — ABNORMAL HIGH (ref 70–99)
Potassium: 3.5 mEq/L (ref 3.5–5.1)

## 2012-11-10 LAB — CBC
HCT: 23.8 % — ABNORMAL LOW (ref 39.0–52.0)
Hemoglobin: 8.2 g/dL — ABNORMAL LOW (ref 13.0–17.0)
MCHC: 34.5 g/dL (ref 30.0–36.0)
MCV: 87.8 fL (ref 78.0–100.0)

## 2012-11-10 LAB — GI PATHOGEN PANEL BY PCR, STOOL
C difficile toxin A/B: NEGATIVE
Cryptosporidium by PCR: NEGATIVE
E coli 0157 by PCR: NEGATIVE
G lamblia by PCR: NEGATIVE
Rotavirus A by PCR: NEGATIVE
Salmonella by PCR: NEGATIVE
Shigella by PCR: NEGATIVE

## 2012-11-10 MED ORDER — METRONIDAZOLE IN NACL 5-0.79 MG/ML-% IV SOLN
500.0000 mg | Freq: Three times a day (TID) | INTRAVENOUS | Status: DC
  Administered 2012-11-10 – 2012-11-17 (×21): 500 mg via INTRAVENOUS
  Filled 2012-11-10 (×24): qty 100

## 2012-11-10 MED ORDER — SODIUM CHLORIDE 0.9 % IJ SOLN
10.0000 mL | INTRAMUSCULAR | Status: DC | PRN
  Administered 2012-11-16 (×2): 10 mL

## 2012-11-10 MED ORDER — MAGNESIUM SULFATE 40 MG/ML IJ SOLN
2.0000 g | Freq: Once | INTRAMUSCULAR | Status: AC
  Administered 2012-11-10: 2 g via INTRAVENOUS
  Filled 2012-11-10: qty 50

## 2012-11-10 MED ORDER — SODIUM CHLORIDE 0.9 % IJ SOLN
10.0000 mL | Freq: Two times a day (BID) | INTRAMUSCULAR | Status: DC
  Administered 2012-11-10 – 2012-11-16 (×2): 10 mL

## 2012-11-10 MED ORDER — POTASSIUM CHLORIDE 10 MEQ/100ML IV SOLN
INTRAVENOUS | Status: AC
  Administered 2012-11-10: 10 meq
  Filled 2012-11-10: qty 100

## 2012-11-10 MED ORDER — TRACE MINERALS CR-CU-F-FE-I-MN-MO-SE-ZN IV SOLN
INTRAVENOUS | Status: AC
  Administered 2012-11-10: 18:00:00 via INTRAVENOUS
  Filled 2012-11-10: qty 1000

## 2012-11-10 MED ORDER — POTASSIUM CHLORIDE 10 MEQ/100ML IV SOLN
10.0000 meq | INTRAVENOUS | Status: AC
  Administered 2012-11-10: 10 meq via INTRAVENOUS
  Filled 2012-11-10: qty 100

## 2012-11-10 MED ORDER — HEPARIN SODIUM (PORCINE) 5000 UNIT/ML IJ SOLN
5000.0000 [IU] | Freq: Three times a day (TID) | INTRAMUSCULAR | Status: DC
  Administered 2012-11-10 – 2012-11-12 (×6): 5000 [IU] via SUBCUTANEOUS
  Filled 2012-11-10 (×9): qty 1

## 2012-11-10 MED ORDER — FAT EMULSION 20 % IV EMUL
250.0000 mL | INTRAVENOUS | Status: AC
  Administered 2012-11-10: 250 mL via INTRAVENOUS
  Filled 2012-11-10: qty 250

## 2012-11-10 MED ORDER — POTASSIUM CHLORIDE 10 MEQ/100ML IV SOLN
10.0000 meq | INTRAVENOUS | Status: AC
  Administered 2012-11-10 (×2): 10 meq via INTRAVENOUS
  Filled 2012-11-10 (×2): qty 100

## 2012-11-10 NOTE — Progress Notes (Signed)
PULMONARY  / CRITICAL CARE MEDICINE  Name: James [name] MRN: [id number] DOB: 10-04-1950    ADMISSION DATE:  11/07/2012  CHIEF COMPLAINT: ABD Pain, nausea, vomiting, diarrhea   BRIEF PATIENT DESCRIPTION: 62 y/o WM with PMH of COPD, Lung cancer s/p resection at [facility] who presented to [facility] with acute onset abdominal pain with associated n/v/d. Tx to [facility] ICU for further eval.   SIGNIFICANT EVENTS / STUDIES:  2/7 - CT ABD / Pelvis>>>Atherosclerotic disease of the abdominal aorta and visceral arteries, as above, without evidence of aortic aneurysm or dissection. There is chronic atrophy of the posterior aspect of the left kidney having an appearance consistent with prior vascular insult. Patent right common iliac artery stent Cholelithiasis without overt CT evidence of cholecystitis Diffuse dilatation of the colon containing stool and liquid. Findings are suggestive of enteritis. No associated abscess or small bowel dilatation.  2/8 descending/sigmoid colectomy with wound vac placement  2/9 A fib with RVR   LINES / TUBES:  R IJ TLC 2/7>>> self dc'ed 2/9  CULTURES:  BCx2 2/7>>>  Stool Culture>>>  Cdiff PCR>>>negative  Urine 2/7 >>> negative   ANTIBIOTICS:  Flagyl 2/7>>>  Cipro 2/7>>>  Vanco PO 2/7>>>2/8  SUBJECTIVE:  Fib rvr, cardizem  VITAL SIGNS: Temp:  [98.1 F (36.7 C)-100.3 F (37.9 C)] 99.7 F (37.6 C) (02/10 0600) Pulse Rate:  [99-116] 102 (02/10 0600) Resp:  [10-20] 19 (02/10 0600) BP: (114-156)/(56-84) 143/83 mmHg (02/10 0600) SpO2:  [87 %-100 %] 100 % (02/10 0750) FiO2 (%):  [40 %-50 %] 40 % (02/09 2300) Weight:  [209 lb 14.1 oz (95.2 kg)] 209 lb 14.1 oz (95.2 kg) (02/10 0500)  HEMODYNAMICS: CVP:  [8 mmHg-9 mmHg] 8 mmHg  VENTILATOR SETTINGS: Vent Mode:  [-]  FiO2 (%):  [40 %-50 %] 40 %  INTAKE / OUTPUT: Intake/Output     02/09 0701 - 02/10 0700 02/10 0701 - 02/11 0700   I.V. (mL/kg) 2356.6 (24.8)    IV Piggyback 1200    Total Intake(mL/kg) 3556.6  (37.4)    Urine (mL/kg/hr) 1910 (0.8)    Emesis/NG output 200 (0.1)    Stool 175 (0.1)    Blood     Total Output 2285     Net +1271.6           PHYSICAL EXAMINATION: General: Ill appearing  Neuro: Alert, mildly confused  HEENT: NG tube in place  Cardiovascular: s1s2 tachycardic, irregular  Lungs: decreased BS, no wheeze  Abdomen: Colostomy LLQ, wound dressing clean  Musculoskeletal: No edema  Skin: no rashes  LABS:  Recent Labs Lab 11/07/12 1220 11/07/12 1839 11/07/12 1841 11/08/12 0505  11/08/12 1930 11/09/12 0142 11/09/12 0415 11/09/12 0830 11/09/12 1540 11/10/12 0400  HGB 16.4  --   --  11.6*  < > 9.7* 9.3*  --   --   --  8.2*  WBC 25.9*  --   --  18.3*  --  12.8* 12.2*  --   --   --  12.4*  PLT 421*  --   --  290  --  244 227  --   --   --  193  NA 133*  --   --  138  < > 136 137  --   --   --  140  K 4.1  --   --  3.6  < > 3.6 3.6  --   --   --  3.5  CL 93*  --   --  107  --  105 106  --   --   --  108  CO2 24  --   --  20  --  25 24  --   --   --  25  GLUCOSE 163*  --   --  131*  < > 168* 115*  --   --   --  101*  BUN 27*  --   --  22  --  22 22  --   --   --  20  CREATININE 1.20  --   --  1.18  --  1.13 1.11  --   --   --  0.93  CALCIUM 10.2  --   --  7.7*  --  7.5* 7.7*  --   --   --  8.2*  MG  --   --   --   --   --   --  1.7  --   --   --  1.7  AST 26  --   --  18  --  19 23  --   --   --   --   ALT 20  --   --  13  --  13 15  --   --   --   --   ALKPHOS 299*  --   --  218*  --  129* 120*  --   --   --   --   BILITOT 0.4  --   --  0.5  --  0.4 0.3  --   --   --   --   PROT 9.0*  --   --  5.9*  --  5.0* 5.1*  --   --   --   --   ALBUMIN 4.6  --   --  2.8*  --  2.4* 2.4*  --   --   --   --   APTT 37  --   --   --   --   --   --   --   --   --   --   INR 0.98  --   --   --   --   --   --   --   --   --   --   LATICACIDVEN 1.4 1.9  --   --   --   --   --   --   --   --   --   TROPONINI <0.30  --   --   --   --   --   --  <0.30 <0.30 <0.30  --   PROCALCITON   --   --  1.07  --   --   --   --   --   --   --   --   < > = values in this interval not displayed.  Recent Labs Lab 11/09/12 0405 11/09/12 0721 11/09/12 1128 11/09/12 1530 11/09/12 1938  GLUCAP 103* 86 99 95 103*    CXR: Comparison: Chest radiograph performed 11/09/2012 Findings: The patient's right IJ line is noted barely overlying the right internal jugular vein at its tip. The lungs are mildly hypoexpanded. There is a likely small left pleural effusion, with associated airspace opacity. Mild right basilar atelectasis is noted. Vascular crowding is seen. No pneumothorax is identified. The cardiomediastinal silhouette is normal in size. No acute osseous abnormalities are identified.  IMPRESSION:  1. Right IJ  line noted near the overlying the right internal jugular vein, at its tip.  2. Lungs mildly hypoexpanded; likely persistent small left pleural effusion, with associated airspace opacity. Mild right basilar atelectasis noted.  ASSESSMENT / PLAN:  PULMONARY  A:  Lower lobe atelectasis - in setting of pain / abdominal splinting  COPD - on symbicort at baseline  P:  -scheduled BD's >> change to xopenex due to tachycardia  -bronchial hygiene  -oxygen to keep SpO2 > 92%  -f/u CXR as needed  CARDIOVASCULAR  A:  Hypovolemic Shock - in setting of ischemic colitis; resolved  Swollen upper lip - eyelid and lip edema, no tongue involvement, unclear etiology; improved 2/8  Atrial Fibrillation - noted in [facility], and again 2/9  P:  -fluids to keep CVP > 6  -cardizem gtt to hr goal 65-105 -if to SR, then can dc Cardizem as equal to placebo to maintain SR -if reoccurs, consider amio -no hep drip as post op and concerns ischemia to ostomy -tsh in am -correct k, Mag in setting fib rvr  RENAL  A:  Hyponatremia, Hypochloremia >> resolved.  Hypokalemia, hypomagt P:  -f/u BMET -corect k, mag -may need 1/2 NS if cl rises furtehr  GASTROINTESTINAL  A:  Ischemic colitis s/p  laparotomy 2/8.  P:  -post op care, nutrition per CCS  -add tpn, will need new line, plan PICC  HEMATOLOGIC  A:  Leukocytosis >> improved.  Anemia of critical illness.  P:  -f/u CBC  -Transfuse for Hb < 7 -dvt prevention, add sub q heparin  INFECTIOUS  A:  N/V/D with associated Acute ABD Pain  P:  -continue cipro, flagyl -pcxr in am  ENDOCRINE  A:  Hyperglycemia - noted on initial BMP  P:  -monitor BMP glucose   NEUROLOGIC  A:  Pain dilerium P:  -Dilaudid PCA, consider dc this and transition to fentanyl -Dose adjust flagyl down -behavioral modifiers -may need haldol -chair, mobilize  Signed D. [place] Rolene Arbour, MD PGY-2 11/10/2012, 7:56 AM Ccm time 30 min , afib ,. Rvr, vaso actives  I have fully examined this patient and agree with above findings.    And edited in full  Yordy Matton. Tyson Alias, MD, FACP Pgr: [phone] [facility]

## 2012-11-10 NOTE — Progress Notes (Signed)
Patient ID: James [name], male   DOB: 1951/03/17, 62 y.o.   MRN: [id number] 2 Days Post-Op  Subjective: Pt reports feeling better, denies fevers or chills, denies n/v, pain well controlled  Objective: Vital signs in last 24 hours: Temp:  [98.1 F (36.7 C)-100.3 F (37.9 C)] 99.7 F (37.6 C) (02/10 0900) Pulse Rate:  [95-116] 100 (02/10 0900) Resp:  [10-20] 19 (02/10 0900) BP: (124-156)/(56-84) 141/68 mmHg (02/10 0900) SpO2:  [87 %-100 %] 96 % (02/10 0900) FiO2 (%):  [40 %-50 %] 40 % (02/09 2300) Weight:  [209 lb 14.1 oz (95.2 kg)] 209 lb 14.1 oz (95.2 kg) (02/10 0500) Last BM Date: 11/09/12  Intake/Output from previous day: 02/09 0701 - 02/10 0700 In: 3556.6 [I.V.:2356.6; IV Piggyback:1200] Out: 2285 [Urine:1910; Emesis/NG output:200; Stool:175] Intake/Output this shift:    PE: Abd: somewhat distended, no bowel sounds, ostomy with significant necrosis and very edematous internally, ?full thickness involvement Wound vac in place, plan change tomorrow.  Lab Results:   Recent Labs  11/09/12 0142 11/10/12 0400  WBC 12.2* 12.4*  HGB 9.3* 8.2*  HCT 27.6* 23.8*  PLT 227 193   BMET  Recent Labs  11/09/12 0142 11/10/12 0400  NA 137 140  K 3.6 3.5  CL 106 108  CO2 24 25  GLUCOSE 115* 101*  BUN 22 20  CREATININE 1.11 0.93  CALCIUM 7.7* 8.2*   PT/INR  Recent Labs  11/07/12 1220  LABPROT 12.9  INR 0.98   CMP     Component Value Date/Time   NA 140 11/10/2012 0400   K 3.5 11/10/2012 0400   CL 108 11/10/2012 0400   CO2 25 11/10/2012 0400   GLUCOSE 101* 11/10/2012 0400   BUN 20 11/10/2012 0400   CREATININE 0.93 11/10/2012 0400   CALCIUM 8.2* 11/10/2012 0400   PROT 5.1* 11/09/2012 0142   ALBUMIN 2.4* 11/09/2012 0142   AST 23 11/09/2012 0142   ALT 15 11/09/2012 0142   ALKPHOS 120* 11/09/2012 0142   BILITOT 0.3 11/09/2012 0142   GFRNONAA 88* 11/10/2012 0400   GFRAA >90 11/10/2012 0400   Lipase     Component Value Date/Time   LIPASE 45 11/07/2012 1220        Studies/Results: Dg Chest Port 1 View  11/10/2012  *RADIOLOGY REPORT*  Clinical Data: Patient pulled central line; check tip position.  PORTABLE CHEST - 1 VIEW  Comparison: Chest radiograph performed 11/09/2012  Findings: The patient's right IJ line is noted barely overlying the right internal jugular vein at its tip.  The lungs are mildly hypoexpanded.  There is a likely small left pleural effusion, with associated airspace opacity.  Mild right basilar atelectasis is noted.  Vascular crowding is seen.  No pneumothorax is identified.  The cardiomediastinal silhouette is normal in size.  No acute osseous abnormalities are identified.  IMPRESSION:  1.  Right IJ line noted near the overlying the right internal jugular vein, at its tip. 2.  Lungs mildly hypoexpanded; likely persistent small left pleural effusion, with associated airspace opacity.  Mild right basilar atelectasis noted.  These results were called by telephone on 11/10/2012 at 02:16 a.m. to [facility] on [id number], who verbally acknowledged these results.   Original Report Authenticated By: Tonia Ghent, M.D.    Dg Chest Port 1 View  11/09/2012  *RADIOLOGY REPORT*  Clinical Data: Follow-up atelectasis  PORTABLE CHEST - 1 VIEW  Comparison: Chest radiograph to 03/2013  Findings: Right central venous line is unchanged.  Interval introduction of NG tube  with tip in the gastric fundus.  Stable cardiac silhouette.  There is left lower lobe atelectasis with small effusion not changed.  No evidence of pulmonary edema.  Low lung volumes.  IMPRESSION: 1.  Interval introduction of an NG tube. 2.  Low lung volumes and left basilar atelectasis.   Original Report Authenticated By: Genevive Bi, M.D.    Dg Abd Portable 1v  11/08/2012  *RADIOLOGY REPORT*  Clinical Data: Abdominal distention  PORTABLE ABDOMEN - 1 VIEW  Comparison: None.  Findings: Supine portable views of the abdomen show distended small bowel loops measuring up to 4.2 cm in diameter.  No  distention of the colon is seen, and these findings are most consistent with a partial small bowel obstruction.  Free air cannot be assessed on portable supine views, with an erect view being necessary.  IMPRESSION: Suspect partial small bowel obstruction.  Cannot assess free air on portable supine view.   Original Report Authenticated By: Dwyane Dee, M.D.     Anti-infectives: Anti-infectives   Start     Dose/Rate Route Frequency Ordered Stop   11/07/12 1800  vancomycin (VANCOCIN) 50 mg/mL oral solution 500 mg  Status:  Discontinued     500 mg Oral 4 times per day 11/07/12 1649 11/08/12 1934   11/07/12 1800  metroNIDAZOLE (FLAGYL) IVPB 500 mg     500 mg 100 mL/hr over 60 Minutes Intravenous 4 times per day 11/07/12 1649     11/07/12 1700  ciprofloxacin (CIPRO) IVPB 400 mg     400 mg 200 mL/hr over 60 Minutes Intravenous Every 12 hours 11/07/12 1649     11/07/12 1336  ceFEPIme (MAXIPIME) 1 G injection    Comments:  HENSON, VICKIE: cabinet override      11/07/12 1336 11/07/12 1356   11/07/12 1230  vancomycin (VANCOCIN) IVPB 1000 mg/200 mL premix     1,000 mg 200 mL/hr over 60 Minutes Intravenous  Once 11/07/12 1226 11/07/12 1515   11/07/12 1230  ceFEPIme (MAXIPIME) 1 g in dextrose 5 % 50 mL IVPB  Status:  Discontinued     1 g 100 mL/hr over 30 Minutes Intravenous  Once 11/07/12 1226 11/07/12 1736       Assessment/Plan 1. POD#2-exp lap with descending and sigmoid colectomy and colostomy: pt's ostomy is marginal and will need close monitoring, may need revision, patient likely to have prolonged NPO status and would likely benefit from TNA, pulled out central line yesterday and does not have another central access, discussed this with critical care and they will managed this aspect.  On cipro/flagyl, also still on cardizem gtt but weaning  --keep npo/ngt  --would benefit from tna  --watch ostomy closely  --ok to mobilize if ok with CCM  --wound vac changes starting tomorrow.   LOS: 3  days    WHITE, ELIZABETH 11/10/2012  Agree with above. Now has right arm PICC. VAC on midline wound. Colostomy is beat up - will watch.  Ovidio Kin, MD, [facility] Pager: [phone] Office phone:  [phone]

## 2012-11-10 NOTE — Progress Notes (Signed)
PARENTERAL NUTRITION CONSULT NOTE - INITIAL  Pharmacy Consult for TPN Indication: bowel resection; prolonged NPO status  Allergies  Allergen Reactions  . Amlodipine Besy-Benazepril Hcl     REACTION: sob  . Meloxicam     REACTION: stomach upset  . Penicillins     REACTION: Rash    Patient Measurements: Height: 6\' 2"  (188 cm) Weight: 209 lb 14.1 oz (95.2 kg) IBW/kg (Calculated) : 82.2 Adjusted Body Weight: 82.9kg  Vital Signs: Temp: 99.6 F (37.6 C) (02/10 1000) Temp src: Core (Comment) (02/10 0800) BP: 133/70 mmHg (02/10 1000) Pulse Rate: 100 (02/10 1000) Intake/Output from previous day: 02/09 0701 - 02/10 0700 In: 3656.6 [I.V.:2456.6; IV Piggyback:1200] Out: 2285 [Urine:1910; Emesis/NG output:200; Stool:175] Intake/Output from this shift: Total I/O In: 300 [I.V.:300] Out: 0   Labs:  Recent Labs  11/07/12 1220  11/08/12 1930 11/09/12 0142 11/10/12 0400  WBC 25.9*  < > 12.8* 12.2* 12.4*  HGB 16.4  < > 9.7* 9.3* 8.2*  HCT 49.2  < > 28.6* 27.6* 23.8*  PLT 421*  < > 244 227 193  APTT 37  --   --   --   --   INR 0.98  --   --   --   --   < > = values in this interval not displayed.   Recent Labs  11/07/12 1220 11/08/12 0505  11/08/12 1930 11/09/12 0142 11/10/12 0400  NA 133* 138  < > 136 137 140  K 4.1 3.6  < > 3.6 3.6 3.5  CL 93* 107  --  105 106 108  CO2 24 20  --  25 24 25   GLUCOSE 163* 131*  < > 168* 115* 101*  BUN 27* 22  --  22 22 20   CREATININE 1.20 1.18  --  1.13 1.11 0.93  CALCIUM 10.2 7.7*  --  7.5* 7.7* 8.2*  MG  --   --   --   --  1.7 1.7  PROT 9.0* 5.9*  --  5.0* 5.1*  --   ALBUMIN 4.6 2.8*  --  2.4* 2.4*  --   AST 26 18  --  19 23  --   ALT 20 13  --  13 15  --   ALKPHOS 299* 218*  --  129* 120*  --   BILITOT 0.4 0.5  --  0.4 0.3  --   BILIDIR  --  0.2  --   --   --   --   IBILI  --  0.3  --   --   --   --   < > = values in this interval not displayed. Estimated Creatinine Clearance: 94.5 ml/min (by C-G formula based on Cr of  0.93).    Recent Labs  11/09/12 1128 11/09/12 1530 11/09/12 1938  GLUCAP 99 95 103*    Medical History: Past Medical History  Diagnosis Date  . Cancer of lung     s/p resection in 2005 at [facility] / [place] patient  . COPD (chronic obstructive pulmonary disease)   . Hypertension   . High cholesterol     Medications:  Scheduled:  . [COMPLETED] acetaminophen  1,000 mg Intravenous Q6H  . ciprofloxacin  400 mg Intravenous Q12H  . famotidine (PEPCID) IV  20 mg Intravenous Q12H  . heparin subcutaneous  5,000 Units Subcutaneous Q8H  . HYDROmorphone PCA 0.3 mg/mL   Intravenous Q4H  . ipratropium  0.5 mg Nebulization Q6H  . levalbuterol  0.63 mg Nebulization  Q6H  . magnesium sulfate 1 - 4 g bolus IVPB  2 g Intravenous Once  . metronidazole  500 mg Intravenous Q8H  . [COMPLETED] potassium chloride  10 mEq Intravenous Q1 Hr x 2  . potassium chloride  10 mEq Intravenous Q1 Hr x 2  . [COMPLETED] potassium chloride      . [COMPLETED] potassium chloride      . [DISCONTINUED] HYDROmorphone PCA 0.3 mg/mL   Intravenous Q4H  . [DISCONTINUED] metronidazole  500 mg Intravenous Q6H   Infusions:  . sodium chloride 100 mL/hr at 11/09/12 1129  . diltiazem (CARDIZEM) infusion 5 mg/hr (11/09/12 1900)    Insulin Requirements in the past 24 hours:  zero  Current Nutrition:  NPO  Assessment: 62 y/o male patient admitted 2.7 with abdominal pain and n/v/d, found to have ischemic small bowel needing resection. Now pod#2 s/p sigmoid colectomy and colostomy requiring TPN for anticipated prolonged NPO status.   Nutritional Goals:  2075-2505 kCal, 93-144 grams of protein per day, will f/u with RD recommendations  GI: . Pod#2 s/p exlap with bowel resection. Npo  Endo: cbg <150 , no h/o dm Lytes: k low nl, replaced, other lytes wnl Renal: scr nl; +1.4L Pulm: 96% sat on 6L [place] Cards: vss bp 133/70 Hepatobil: LFT, tbili wnl Neuro: no c/o ID:   Poss enteritis, started on cipro /flagyl Best Practices:  pepcid, ufh TPN Access: cline pending TPN day#:0   Plan:  1.Begin Clinimix E 5/20 at 5ml/hr , which will provide about 40% of nutritional goals. Will aim for rate of 177ml/hr which would provide 2317 kcal and 120g protein on daily average. 2.Decrease IVF to 6ml/hr 3.Check cbg's q4h 4.MVI and lipids MWF 5.Add pepcid to tpn  Verlene Mayer, PharmD, BCPS Pager [phone] 11/10/2012,10:53 AM

## 2012-11-10 NOTE — Progress Notes (Signed)
Peripherally Inserted Central Catheter/Midline Placement  The IV Nurse has discussed with the patient and/or persons authorized to consent for the patient, the purpose of this procedure and the potential benefits and risks involved with this procedure.  The benefits include less needle sticks, lab draws from the catheter and patient may be discharged home with the catheter.  Risks include, but not limited to, infection, bleeding, blood clot (thrombus formation), and puncture of an artery; nerve damage and irregular heat beat.  Alternatives to this procedure were also discussed.  PICC/Midline Placement Documentation        James [name] 11/10/2012, 2:34 PM

## 2012-11-10 NOTE — Progress Notes (Signed)
PULMONARY  / CRITICAL CARE MEDICINE  Name: James [name] MRN: [id number] DOB: [birth year]    ADMISSION DATE:  11/07/2012  BRIEF PATIENT DESCRIPTION: 62 y/o WM with PMH of COPD, Lung cancer s/p resection at [facility] who presented to [facility] with acute onset abdominal pain with associated n/v/d. Tx to [facility] ICU for further eval.   SIGNIFICANT EVENTS / STUDIES:  2/7 CT ABD / Pelvis>>>Findings are suggestive of enteritis. No associated abscess or small bowel dilatation.  2/8 descending/sigmoid colectomy with wound vac placement  2/9 A fib with RVR   LINES / TUBES:  R IJ TLC 2/7>>> self dc'ed 2/9  picc 2/10 rt>>>  CULTURES:  BCx2 2/7>>> Negative Stool Culture>>> Negative Cdiff PCR>>>negative  Urine 2/7 >>> negative   ANTIBIOTICS:  Flagyl 2/7>>>  Cipro 2/7>>>  Vanco PO 2/7>>>2/8   SUBJECTIVE: More alert. Afebrile.   VITAL SIGNS: Temp:  [99.3 F (37.4 C)-100.1 F (37.8 C)] 99.5 F (37.5 C) (02/11 0700) Pulse Rate:  [87-104] 91 (02/11 0700) Resp:  [13-26] 16 (02/11 0700) BP: (125-158)/(63-87) 158/77 mmHg (02/11 0700) SpO2:  [92 %-99 %] 97 % (02/11 0834) Weight:  [213 lb 6.5 oz (96.8 kg)] 213 lb 6.5 oz (96.8 kg) (02/11 0452)  HEMODYNAMICS:    VENTILATOR SETTINGS:    INTAKE / OUTPUT: Intake/Output     02/10 0701 - 02/11 0700 02/11 0701 - 02/12 0700   I.V. (mL/kg) 1420 (14.7)    IV Piggyback 800    TPN 700    Total Intake(mL/kg) 2920 (30.2)    Urine (mL/kg/hr) 1105 (0.5)    Emesis/NG output 50 (0)    Drains 50 (0)    Stool     Total Output 1205     Net +1715            PHYSICAL EXAMINATION: General: Ill appearing  Neuro: Alert, mildly confused  HEENT:  Cardiovascular: RRR. Normal s1s2 no murmurs Lungs: decreased BS, no wheeze  Abdomen: Colostomy LLQ, wound dressing clean  Musculoskeletal: No edema  Skin: no rashes  LABS:  Recent Labs Lab 11/07/12 1220 11/07/12 1839 11/07/12 1841  11/08/12 1930 11/09/12 0142 11/09/12 0415 11/09/12 0830  11/09/12 1540 11/10/12 0400 11/11/12 0434  HGB 16.4  --   --   < > 9.7* 9.3*  --   --   --  8.2* 7.6*  WBC 25.9*  --   --   < > 12.8* 12.2*  --   --   --  12.4* 11.0*  PLT 421*  --   --   < > 244 227  --   --   --  193 223  NA 133*  --   --   < > 136 137  --   --   --  140 142  K 4.1  --   --   < > 3.6 3.6  --   --   --  3.5 3.1*  CL 93*  --   --   < > 105 106  --   --   --  108 106  CO2 24  --   --   < > 25 24  --   --   --  25 30  GLUCOSE 163*  --   --   < > 168* 115*  --   --   --  101* 127*  BUN 27*  --   --   < > 22 22  --   --   --  20 20  CREATININE 1.20  --   --   < > 1.13 1.11  --   --   --  0.93 0.84  CALCIUM 10.2  --   --   < > 7.5* 7.7*  --   --   --  8.2* 8.1*  MG  --   --   --   --   --  1.7  --   --   --  1.7 1.9  PHOS  --   --   --   --   --   --   --   --   --   --  2.8  AST 26  --   --   < > 19 23  --   --   --   --  33  ALT 20  --   --   < > 13 15  --   --   --   --  21  ALKPHOS 299*  --   --   < > 129* 120*  --   --   --   --  89  BILITOT 0.4  --   --   < > 0.4 0.3  --   --   --   --  0.2*  PROT 9.0*  --   --   < > 5.0* 5.1*  --   --   --   --  5.4*  ALBUMIN 4.6  --   --   < > 2.4* 2.4*  --   --   --   --  2.2*  APTT 37  --   --   --   --   --   --   --   --   --   --   INR 0.98  --   --   --   --   --   --   --   --   --   --   LATICACIDVEN 1.4 1.9  --   --   --   --   --   --   --   --   --   TROPONINI <0.30  --   --   --   --   --  <0.30 <0.30 <0.30  --   --   PROCALCITON  --   --  1.07  --   --   --   --   --   --   --   --   < > = values in this interval not displayed.  Recent Labs Lab 11/09/12 1530 11/09/12 1938 11/10/12 2201 11/10/12 2312 11/11/12 0403  GLUCAP 95 103* 113* 121* 120*    CXR: 11/10/12  IMPRESSION:  Lungs mildly hypoexpanded; likely persistent small left pleural effusion, with associated airspace opacity. Mild right basilar atelectasis noted.   ASSESSMENT / PLAN:  PULMONARY  A:  Lower lobe atelectasis - in setting of pain /  abdominal splinting  COPD - on symbicort at baseline  P:  -scheduled BD's >> change to xopenex due to tachycardia  -bronchial hygiene  -oxygen to keep SpO2 > 92%  -f/u CXR as needed   CARDIOVASCULAR  A:  Hypovolemic Shock - in setting of ischemic colitis; resolved  Swollen upper lip - eyelid and lip edema, no tongue involvement, unclear etiology; improved 2/8  Atrial Fibrillation - noted in [facility], and again 2/9 >> resolved 2/11 P:   -if Afib reoccurs, consider amio  -  no hep drip as post op and concerns ischemia to ostomy  -tsh in am pending results -echo assessment  RENAL  A:  Hyponatremia, Hypochloremia >> resolved.  Hypokalemia, hypomagt  P:  -f/u BMET  -correct K, mag -kvo, avoid chf?, int markings increased Dc foley done  GASTROINTESTINAL  A: reflux? GERD Ischemic colitis s/p laparotomy 2/8.  P:  -post op care, nutrition per [facility], they are observing ostomy  - On TPN -dc pepcid, protonix 40 bid  HEMATOLOGIC  A:  Leukocytosis >> improved.  Anemia of critical illness.  P:  -f/u CBC  -Transfuse for Hb < 7, no indication now, low threshold lasix - sub q heparin   INFECTIOUS  A:  N/V/D with associated Acute ABD Pain  P:  -continue cipro, flagyl, concern ostomy ischemia, keep on ABX  ENDOCRINE  A:  Hyperglycemia - noted on initial BMP  P:  -monitor BMP glucose   NEUROLOGIC  A:  Pain controlled dilerium improved, caused by PCA.  P:  -Dilaudid PCA d/cd yesterday -Dose adjust flagyl down  -behavioral modifiers  -may need haldol  -chair, mobilize PT consult   Signed D.Piloto Rolene Arbour, MD Family Medicine  PGY-2  To triad, tele  I have fully examined this patient and agree with above findings.    And edited in full  James [name]. Tyson Alias, MD, FACP Pgr: [phone] [place] [facility]

## 2012-11-10 NOTE — Progress Notes (Signed)
INITIAL NUTRITION ASSESSMENT  DOCUMENTATION CODES Per approved criteria  -Not Applicable   INTERVENTION:  TPN dosing per Pharmacy to meet estimated needs as able during national lipid backorder.  NUTRITION DIAGNOSIS: Inadequate oral intake related to altered GI function as evidenced by NPO status.   Goal: Intake to meet >90% of estimated nutrition needs as able during national lipid backorder.  Monitor:  GI function, TPN tolerance, weight trend, labs.  Reason for Assessment: Consult for new TPN  62 y.o. male  Admitting Dx: Hypovolemic shock; ischemic colitis, S/P descending/sigmoid colectomy with wound vac placement on 2/8.  ASSESSMENT: Patient presented to [facility] with acute onset abdominal pain with associated nausea, vomiting, and diarrhea.  Is not intubated at this time.  TPN being initiated today via PICC for recent bowel resection with expected prolonged NPO status.  Hypokalemia and hypomagnesemia are being replaced.  Height: Ht Readings from Last 1 Encounters:  11/07/12 6\' 2"  (1.88 m)    Weight: Wt Readings from Last 1 Encounters:  11/10/12 209 lb 14.1 oz (95.2 kg)    Ideal Body Weight: 86.4 kg  % Ideal Body Weight: 110%  Wt Readings from Last 10 Encounters:  11/10/12 209 lb 14.1 oz (95.2 kg)  11/10/12 209 lb 14.1 oz (95.2 kg)  11/30/09 201 lb (91.173 kg)  11/01/09 199 lb (90.266 kg)    Usual Body Weight: 201 lb (3 years ago)  % Usual Body Weight: 104%  BMI:  Body mass index is 26.94 kg/(m^2). overweight  Estimated Nutritional Needs: Kcal: 2300-2400 Protein: 125-140 gm Fluid: 2.3-2.4 L  Skin: wound VAC in place to surgical abdominal wound  Diet Order: NPO  To begin TPN with Clinimix E 5/15 @ 40 ml/hr.  Lipids (20% IVFE @ 10 ml/hr), multivitamins, and trace elements are provided 3 times weekly (MWF) due to national backorder.  Provides 887 kcal and 48 grams protein daily (based on weekly average).  Meets 39% minimum estimated kcal  and 38% minimum estimated protein needs.  EDUCATION NEEDS: -Education not appropriate at this time   Intake/Output Summary (Last 24 hours) at 11/10/12 1248 Last data filed at 11/10/12 1133  Gross per 24 hour  Intake   3404 ml  Output   1795 ml  Net   1609 ml    Last BM: 2/9  Labs:   Recent Labs Lab 11/08/12 1930 11/09/12 0142 11/10/12 0400  NA 136 137 140  K 3.6 3.6 3.5  CL 105 106 108  CO2 25 24 25   BUN 22 22 20   CREATININE 1.13 1.11 0.93  CALCIUM 7.5* 7.7* 8.2*  MG  --  1.7 1.7  GLUCOSE 168* 115* 101*    CBG (last 3)   Recent Labs  11/09/12 1128 11/09/12 1530 11/09/12 1938  GLUCAP 99 95 103*    Scheduled Meds: . ciprofloxacin  400 mg Intravenous Q12H  . heparin subcutaneous  5,000 Units Subcutaneous Q8H  . ipratropium  0.5 mg Nebulization Q6H  . levalbuterol  0.63 mg Nebulization Q6H  . magnesium sulfate 1 - 4 g bolus IVPB  2 g Intravenous Once  . metronidazole  500 mg Intravenous Q8H  . potassium chloride  10 mEq Intravenous Q1 Hr x 2    Continuous Infusions: . sodium chloride 100 mL/hr at 11/09/12 1129  . diltiazem (CARDIZEM) infusion 5 mg/hr (11/09/12 1900)  . TPN (CLINIMIX) +/- additives     And  . fat emulsion      Past Medical History  Diagnosis Date  .  Cancer of lung     s/p resection in 2005 at [facility] / [place] patient  . COPD (chronic obstructive pulmonary disease)   . Hypertension   . High cholesterol     Past Surgical History  Procedure Laterality Date  . Lung surgery      Joaquin Courts, RD, LDN, CNSC Pager# [phone] After Hours Pager# [phone]

## 2012-11-10 NOTE — Progress Notes (Addendum)
Patient got up from bed and partially pulled central line, removed NG tube, Dr. Marry Guan notified and a chest x-ray ordered to confirm placement. Chest x ray confirmed line was out, RN removed line and placed an occlusive dressing over site.   Corliss Skains RN

## 2012-11-10 NOTE — Progress Notes (Signed)
ANTIBIOTIC CONSULT NOTE - Follow-Up  Pharmacy Consult for IV Cipro and Flagyl Indication: enteritis  Allergies  Allergen Reactions  . Amlodipine Besy-Benazepril Hcl     REACTION: sob  . Meloxicam     REACTION: stomach upset  . Penicillins     REACTION: Rash    Patient Measurements: Height: 6\' 2"  (188 cm) Weight: 209 lb 14.1 oz (95.2 kg) IBW/kg (Calculated) : 82.2  Vital Signs: Temp: 99.6 F (37.6 C) (02/10 1000) Temp src: Core (Comment) (02/10 0800) BP: 133/70 mmHg (02/10 1000) Pulse Rate: 100 (02/10 1000) Intake/Output from previous day: 02/09 0701 - 02/10 0700 In: 3656.6 [I.V.:2456.6; IV Piggyback:1200] Out: 2285 [Urine:1910; Emesis/NG output:200; Stool:175] Intake/Output from this shift: Total I/O In: 300 [I.V.:300] Out: 0   Labs:  Recent Labs  11/08/12 1930 11/09/12 0142 11/10/12 0400  WBC 12.8* 12.2* 12.4*  HGB 9.7* 9.3* 8.2*  PLT 244 227 193  CREATININE 1.13 1.11 0.93   Estimated Creatinine Clearance: 94.5 ml/min (by C-G formula based on Cr of 0.93). No results found for this basename: VANCOTROUGH, VANCOPEAK, VANCORANDOM, GENTTROUGH, GENTPEAK, GENTRANDOM, TOBRATROUGH, TOBRAPEAK, TOBRARND, AMIKACINPEAK, AMIKACINTROU, AMIKACIN,  in the last 72 hours   Microbiology: Recent Results (from the past 720 hour(s))  CULTURE, BLOOD (ROUTINE X 2)     Status: None   Collection Time    11/07/12 12:20 PM      Result Value Range Status   Specimen Description Blood   Final   Special Requests NONE   Final   Culture  Setup Time 11/07/2012 18:01   Final   Culture     Final   Value:        BLOOD CULTURE RECEIVED NO GROWTH TO DATE CULTURE WILL BE HELD FOR 5 DAYS BEFORE ISSUING A FINAL NEGATIVE REPORT   Report Status PENDING   Incomplete  CULTURE, BLOOD (ROUTINE X 2)     Status: None   Collection Time    11/07/12  1:30 PM      Result Value Range Status   Specimen Description BLOOD LEFT FOOT   Final   Special Requests NONE BOTTLES DRAWN AEROBIC AND ANAEROBIC 5CC    Final   Culture  Setup Time 11/07/2012 19:48   Final   Culture     Final   Value:        BLOOD CULTURE RECEIVED NO GROWTH TO DATE CULTURE WILL BE HELD FOR 5 DAYS BEFORE ISSUING A FINAL NEGATIVE REPORT   Report Status PENDING   Incomplete  URINE CULTURE     Status: None   Collection Time    11/07/12  1:45 PM      Result Value Range Status   Specimen Description URINE, CATHETERIZED   Final   Special Requests NONE   Final   Culture  Setup Time 11/07/2012 21:06   Final   Colony Count NO GROWTH   Final   Culture NO GROWTH   Final   Report Status 11/08/2012 FINAL   Final  MRSA PCR SCREENING     Status: None   Collection Time    11/07/12  4:00 PM      Result Value Range Status   MRSA by PCR NEGATIVE  NEGATIVE Final   Comment:            The GeneXpert MRSA Assay (FDA     approved for NASAL specimens     only), is one component of a     comprehensive MRSA colonization     surveillance program.  It is not     intended to diagnose MRSA     infection nor to guide or     monitor treatment for     MRSA infections.  URINE CULTURE     Status: None   Collection Time    11/07/12  4:06 PM      Result Value Range Status   Specimen Description URINE, CATHETERIZED   Final   Special Requests NONE   Final   Culture  Setup Time 11/07/2012 16:47   Final   Colony Count NO GROWTH   Final   Culture NO GROWTH   Final   Report Status 11/08/2012 FINAL   Final  CLOSTRIDIUM DIFFICILE BY PCR     Status: None   Collection Time    11/07/12  5:25 PM      Result Value Range Status   C difficile by pcr NEGATIVE  NEGATIVE Final  STOOL CULTURE     Status: None   Collection Time    11/07/12  5:26 PM      Result Value Range Status   Specimen Description STOOL   Final   Special Requests NONE   Final   Culture NO SUSPICIOUS COLONIES, CONTINUING TO HOLD   Final   Report Status PENDING   Incomplete    Medical History: Past Medical History  Diagnosis Date  . Cancer of lung     s/p resection in 2005 at [facility]  / [place] patient  . COPD (chronic obstructive pulmonary disease)   . Hypertension   . High cholesterol    Assessment: Pt on Cipro/Flagyl D#4 for enteritis. 2/7 C.diff neg, Tmax 100.3 (down), WBC 12.4, PCT/cortisol elevated, LA nl. 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  Cipro 2/7 >> Flagyl 2/7 >>  2/7 urine cx x2 - neg 2/7 bld cx x2 - NGTD 2/7 stool cx - pending  Goal of Therapy:  Eradication of infection  Plan:  - Continue Ciprofloxacin 400mg  IV q 12h - Change Flagyl to 500mg  IV q 8h - Will monitor cx/spec/sens, renal function and clinical status daily.  Thanks, Christoper Fabian, PharmD, BCPS Clinical pharmacist, pager [phone] 11/10/2012 10:40 AM

## 2012-11-10 NOTE — Progress Notes (Signed)
[facility] ADULT ICU REPLACEMENT PROTOCOL FOR AM LAB REPLACEMENT ONLY  The patient does apply for the [facility] Adult ICU Electrolyte Replacment Protocol based on the criteria listed below:   1. Is GFR >/= 40 ml/min? yes  Patient's GFR today is 88 2. Is urine output >/= 0.5 ml/kg/hr for the last 6 hours? yes Patient's UOP is 1 ml/kg/hr 3. Is BUN < 60 mg/dL? yes  Patient's BUN today is 20 4. Abnormal electrolyte(s): K3.5 5. Ordered repletion with: 21meq-IV   James [name] 11/10/2012 5:55 AM

## 2012-11-11 ENCOUNTER — Encounter (HOSPITAL_COMMUNITY): Payer: Self-pay | Admitting: Surgery

## 2012-11-11 LAB — COMPREHENSIVE METABOLIC PANEL
ALT: 21 U/L (ref 0–53)
Calcium: 8.1 mg/dL — ABNORMAL LOW (ref 8.4–10.5)
Creatinine, Ser: 0.84 mg/dL (ref 0.50–1.35)
GFR calc Af Amer: >90 mL/min (ref >90)
GFR calc non Af Amer: >90 mL/min (ref >90)
Glucose, Bld: 127 mg/dL — ABNORMAL HIGH (ref 70–99)
Sodium: 142 mEq/L (ref 135–145)
Total Protein: 5.4 g/dL — ABNORMAL LOW (ref 6.0–8.3)

## 2012-11-11 LAB — GLUCOSE, CAPILLARY
Glucose-Capillary: 120 mg/dL — ABNORMAL HIGH (ref 70–99)
Glucose-Capillary: 138 mg/dL — ABNORMAL HIGH (ref 70–99)
Glucose-Capillary: 128 mg/dL — ABNORMAL HIGH (ref 70–99)

## 2012-11-11 LAB — DIFFERENTIAL
Basophils Absolute: 0 10*3/uL (ref 0.0–0.1)
Basophils Relative: 0 % (ref 0–1)
Lymphocytes Relative: 9 % — ABNORMAL LOW (ref 12–46)
Monocytes Absolute: 0.7 10*3/uL (ref 0.1–1.0)
Neutro Abs: 9.2 10*3/uL — ABNORMAL HIGH (ref 1.7–7.7)
Neutrophils Relative %: 83 % — ABNORMAL HIGH (ref 43–77)

## 2012-11-11 LAB — CBC
HCT: 22.7 % — ABNORMAL LOW (ref 39.0–52.0)
MCHC: 33.5 g/dL (ref 30.0–36.0)
Platelets: 223 10*3/uL (ref 150–400)
RDW: 14.1 % (ref 11.5–15.5)
WBC: 11 10*3/uL — ABNORMAL HIGH (ref 4.0–10.5)

## 2012-11-11 LAB — MAGNESIUM: Magnesium: 1.9 mg/dL (ref 1.5–2.5)

## 2012-11-11 LAB — STOOL CULTURE

## 2012-11-11 LAB — TRIGLYCERIDES: Triglycerides: 185 mg/dL — ABNORMAL HIGH (ref <150)

## 2012-11-11 LAB — PHOSPHORUS: Phosphorus: 2.8 mg/dL (ref 2.3–4.6)

## 2012-11-11 LAB — TSH: TSH: 1.494 u[IU]/mL (ref 0.350–4.500)

## 2012-11-11 MED ORDER — MAGNESIUM SULFATE 40 MG/ML IJ SOLN
2.0000 g | Freq: Once | INTRAMUSCULAR | Status: AC
  Administered 2012-11-11: 2 g via INTRAVENOUS
  Filled 2012-11-11: qty 50

## 2012-11-11 MED ORDER — CLINIMIX E/DEXTROSE (5/20) 5 % IV SOLN
INTRAVENOUS | Status: AC
  Administered 2012-11-11: 18:00:00 via INTRAVENOUS
  Filled 2012-11-11: qty 2000

## 2012-11-11 MED ORDER — SODIUM CHLORIDE 0.9 % IV SOLN: INTRAVENOUS | Status: DC

## 2012-11-11 MED ORDER — PANTOPRAZOLE SODIUM 40 MG IV SOLR
40.0000 mg | Freq: Two times a day (BID) | INTRAVENOUS | Status: DC
  Administered 2012-11-11 – 2012-11-16 (×11): 40 mg via INTRAVENOUS
  Filled 2012-11-11 (×12): qty 40

## 2012-11-11 MED ORDER — FUROSEMIDE 10 MG/ML IJ SOLN
20.0000 mg | Freq: Once | INTRAMUSCULAR | Status: AC
  Administered 2012-11-11: 20 mg via INTRAVENOUS
  Filled 2012-11-11: qty 2

## 2012-11-11 MED ORDER — FAMOTIDINE 10 MG/ML IV SOLN
INTRAVENOUS | Status: DC
  Filled 2012-11-11: qty 2000

## 2012-11-11 MED ORDER — POTASSIUM CHLORIDE 10 MEQ/50ML IV SOLN
10.0000 meq | INTRAVENOUS | Status: AC
  Administered 2012-11-11 (×4): 10 meq via INTRAVENOUS
  Filled 2012-11-11 (×4): qty 50

## 2012-11-11 MED ORDER — TRACE MINERALS CR-CU-F-FE-I-MN-MO-SE-ZN IV SOLN
INTRAVENOUS | Status: DC
  Filled 2012-11-11: qty 2000

## 2012-11-11 MED FILL — Fibrin Sealant Component Kit 2 ML: CUTANEOUS | Qty: 1 | Status: AC

## 2012-11-11 NOTE — Plan of Care (Signed)
Problem: Phase II Progression Outcomes Goal: Tolerating diet Outcome: Not Progressing Pt still with NGT to LWIS and receiving TPN IV

## 2012-11-11 NOTE — Progress Notes (Signed)
  Echocardiogram 2D Echocardiogram has been performed.  Georgian Co 11/11/2012, 10:28 AM

## 2012-11-11 NOTE — Evaluation (Signed)
Physical Therapy Evaluation Patient Details Name: James [name] MRN: [id number] DOB: Apr 02, 1951 Today's Date: 11/11/2012 Time: 1538-1600 PT Time Calculation (min): 22 min  PT Assessment / Plan / Recommendation Clinical Impression  Pt adm with ischemic colitis and underwent colectomy with colostomy.  Pt with generalized weakness after illness and decr activity.  Needs skilled PT to maximized I and safety so pt can return home with wife.    PT Assessment  Patient needs continued PT services    Follow Up Recommendations  Home health PT    Does the patient have the potential to tolerate intense rehabilitation      Barriers to Discharge        Equipment Recommendations  Rolling walker with 5" wheels    Recommendations for Other Services     Frequency Min 3X/week    Precautions / Restrictions Precautions Precautions: Fall Precaution Comments: VAC, NG tube   Pertinent Vitals/Pain Abd pain 7/10. Nursing notified.      Mobility  Bed Mobility Bed Mobility: Sit to Supine Sit to Supine: 4: Min assist Details for Bed Mobility Assistance: assist to bring feet up Transfers Transfers: Sit to Stand;Stand to Sit Sit to Stand: 4: Min assist;With upper extremity assist;With armrests;From chair/3-in-1 Stand to Sit: 4: Min assist;With upper extremity assist;To bed Details for Transfer Assistance: assist to bring hips up Ambulation/Gait Ambulation/Gait Assistance: 4: Min assist Ambulation Distance (Feet): 100 Feet Assistive device: Rolling walker Ambulation/Gait Assistance Details: Verbal cues to stand erect and stay closer to walker initially Gait Pattern: Step-through pattern;Decreased stride length;Wide base of support;Right flexed knee in stance;Left flexed knee in stance Gait velocity: decr    Exercises     PT Diagnosis: Difficulty walking;Generalized weakness  PT Problem List: Decreased strength;Decreased activity tolerance;Decreased balance;Decreased mobility;Decreased  knowledge of use of DME PT Treatment Interventions: DME instruction;Gait training;Patient/family education;Stair training;Functional mobility training;Therapeutic activities;Therapeutic exercise;Balance training   PT Goals Acute Rehab PT Goals PT Goal Formulation: With patient Time For Goal Achievement: 11/11/12 Potential to Achieve Goals: Good Pt will go Supine/Side to Sit: with modified independence PT Goal: Supine/Side to Sit - Progress: Goal set today Pt will go Sit to Supine/Side: with modified independence PT Goal: Sit to Supine/Side - Progress: Goal set today Pt will go Sit to Stand: with supervision PT Goal: Sit to Stand - Progress: Goal set today Pt will go Stand to Sit: with supervision PT Goal: Stand to Sit - Progress: Goal set today Pt will Ambulate: >150 feet;with supervision PT Goal: Ambulate - Progress: Goal set today Pt will Go Up / Down Stairs: 1-2 stairs;with min assist PT Goal: Up/Down Stairs - Progress: Goal set today  Visit Information  Last PT Received On: 11/11/12 Assistance Needed: +1    Subjective Data  Subjective: Pt very grateful for getting condom cath in place. Patient Stated Goal: Return home.   Prior Functioning  Home Living Lives With: Spouse Available Help at Discharge: Family Type of Home: House Home Access: Stairs to enter Secretary/administrator of Steps: 2 Home Layout: One level Bathroom Shower/Tub: Teacher, adult education: Straight cane Prior Function Level of Independence: Independent Able to Take Stairs?: Yes Driving: Yes Vocation: Retired Musician: No difficulties    Copywriter, advertising Overall Cognitive Status: Appears within functional limits for tasks assessed/performed Arousal/Alertness: Awake/alert Orientation Level: Appears intact for tasks assessed Behavior During Session: [facility] for tasks performed    Extremity/Trunk Assessment Right Lower Extremity Assessment RLE ROM/Strength/Tone:  Deficits RLE ROM/Strength/Tone Deficits: grossly 4/5 Left Lower Extremity Assessment  LLE ROM/Strength/Tone: Deficits LLE ROM/Strength/Tone Deficits: grossly 4/5   Balance Balance Balance Assessed: Yes Static Standing Balance Static Standing - Balance Support: Bilateral upper extremity supported (on walker) Static Standing - Level of Assistance: 4: Min assist  End of Session PT - End of Session Activity Tolerance: Patient tolerated treatment well Patient left: in bed;with call bell/phone within reach Nurse Communication: Patient requests pain meds  GP     [facility] 11/11/2012, 4:17 PM  [facility] PT [phone]

## 2012-11-11 NOTE — Progress Notes (Signed)
[facility] Nursing ICU Electrolyte Replacement Protocol  Patient Name: James [name] DOB: 24-Sep-1951 MRN: [id number]  Date of Service  11/11/2012   HPI/Events of Note  Replaced per protocol  Recent Labs Lab 11/08/12 0505 11/08/12 1338 11/08/12 1930 11/09/12 0142 11/10/12 0400 11/11/12 0434  NA 138 139 136 137 140 142  K 3.6 3.6 3.6 3.6 3.5 3.1*  CL 107  --  105 106 108 106  CO2 20  --  25 24 25 30   GLUCOSE 131* 138* 168* 115* 101* 127*  BUN 22  --  22 22 20 20   CREATININE 1.18  --  1.13 1.11 0.93 0.84  CALCIUM 7.7*  --  7.5* 7.7* 8.2* 8.1*  MG  --   --   --  1.7 1.7 1.9  PHOS  --   --   --   --   --  2.8    Estimated Creatinine Clearance: 104.7 ml/min (by C-G formula based on Cr of 0.84).  Intake/Output     02/10 0701 - 02/11 0700   I.V. (mL/kg) 1300 (13.4)   IV Piggyback 800   TPN 600   Total Intake(mL/kg) 2700 (27.9)   Urine (mL/kg/hr) 1005 (0.4)   Emesis/NG output 50 (0)   Drains 50 (0)   Total Output 1105   Net +1595        - I/O DETAILED x24h    Total I/O In: 1400 [I.V.:600; IV Piggyback:300; TPN:500] Out: 850 [Urine:850] - I/O THIS SHIFT    ASSESSMENT   eICURN Interventions     ASSESSMENT: MAJOR ELECTROLYTE    Merita Norton 11/11/2012, 5:37 AM

## 2012-11-11 NOTE — Care Management Note (Signed)
    Page 1 of 2   11/17/2012     2:55:32 PM   CARE MANAGEMENT NOTE 11/17/2012  Patient:  James [name], James [name]   Account Number:  [id number]  Date Initiated:  11/10/2012  Documentation initiated by:  [facility]  Subjective/Objective Assessment:   Hypotensive shock - sepsis. abd pain.  post op exp lap  for septic peritonitis.     Action/Plan:   MET WITH PT AND FAMILY TO DISCUSS DC PLANS.  PT PLANS TO DC HOME WITH WIFE AND FAMILY AT DC.  WILL FOLLOW FOR HOME NEEDS.   Anticipated DC Date:  11/14/2012   Anticipated DC Plan:  HOME W HOME HEALTH SERVICES      DC Planning Services  CM consult      Choice offered to / List presented to:     DME arranged  Blanche East      DME agency  [facility]        Status of service:  Completed, signed off Medicare Important Message given?   (If response is "NO", the following Medicare IM given date fields will be blank) Date Medicare IM given:   Date Additional Medicare IM given:    Discharge Disposition:  HOME/SELF CARE  Per UR Regulation:  Reviewed for med. necessity/level of care/duration of stay  If discussed at Long Length of Stay Meetings, dates discussed:    Comments:  ContactJafeth, Mustin [phone]  11/17/12 Shawnice Tilmon,RN,BSN [phone] PT FOR DISCHARGE HOME TODAY.  DISCUSSED OPTION OF HHRN WITH PT; HE AND WIFE POLITELY DECLINED, STATING THAT THEY FEEL COMFORTABLE WITH COLOSTOMY, AND HAVE BEEN DOING CARE WITHOUT DIFFICULTY.  THEY STATE THAT IF THEY DO HAVE ISSUES, THEY WILL CALL MD OFFICE, BUT WOULD PREFER TO SEE HOW THEY DO ON THEIR OWN.  PT DOES REQUEST RW FOR HOME . REFERRAL TO [facility] FOR DME NEEDS.  MD MADE AWARE OF REFUSAL OF [facility] CARE.   11-11-12 1:15pm Avie Arenas, RNBSN [phone] Talked with patient and spouse in room.  Patient was independent at home with no needs, goal is to go home, may need HH for wound VAC.  HH list given to them, in case he may need on discharge.   VAC forms placed on chart.  CM  will continue to follow for further needs.

## 2012-11-11 NOTE — Progress Notes (Addendum)
PARENTERAL NUTRITION CONSULT NOTE - F/U  Pharmacy Consult for TPN Indication: bowel resection; prolonged NPO status  Allergies  Allergen Reactions  . Amlodipine Besy-Benazepril Hcl     REACTION: sob  . Meloxicam     REACTION: stomach upset  . Penicillins     REACTION: Rash    Patient Measurements: Height: 6\' 2"  (188 cm) Weight: 213 lb 6.5 oz (96.8 kg) IBW/kg (Calculated) : 82.2 Adjusted Body Weight: 82.9kg  Vital Signs: Temp: 99.5 F (37.5 C) (02/11 0700) BP: 158/77 mmHg (02/11 0700) Pulse Rate: 91 (02/11 0700) Intake/Output from previous day: 02/10 0701 - 02/11 0700 In: 2920 [I.V.:1420; IV Piggyback:800; TPN:700] Out: 1205 [Urine:1105; Emesis/NG output:50; Drains:50] Intake/Output from this shift:    Labs:  Recent Labs  11/09/12 0142 11/10/12 0400 11/11/12 0434  WBC 12.2* 12.4* 11.0*  HGB 9.3* 8.2* 7.6*  HCT 27.6* 23.8* 22.7*  PLT 227 193 223     Recent Labs  11/08/12 1930 11/09/12 0142 11/10/12 0400 11/11/12 0434  NA 136 137 140 142  K 3.6 3.6 3.5 3.1*  CL 105 106 108 106  CO2 25 24 25 30   GLUCOSE 168* 115* 101* 127*  BUN 22 22 20 20   CREATININE 1.13 1.11 0.93 0.84  CALCIUM 7.5* 7.7* 8.2* 8.1*  MG  --  1.7 1.7 1.9  PHOS  --   --   --  2.8  PROT 5.0* 5.1*  --  5.4*  ALBUMIN 2.4* 2.4*  --  2.2*  AST 19 23  --  33  ALT 13 15  --  21  ALKPHOS 129* 120*  --  89  BILITOT 0.4 0.3  --  0.2*  TRIG  --   --   --  185*  CHOL  --   --   --  86   Estimated Creatinine Clearance: 104.7 ml/min (by C-G formula based on Cr of 0.84).    Recent Labs  11/10/12 2201 11/10/12 2312 11/11/12 0403  GLUCAP 113* 121* 120*    Medical History: Past Medical History  Diagnosis Date  . Cancer of lung     s/p resection in 2005 at [facility] / [place] patient  . COPD (chronic obstructive pulmonary disease)   . Hypertension   . High cholesterol     Medications:  Scheduled:  . ciprofloxacin  400 mg Intravenous Q12H  . heparin subcutaneous  5,000 Units Subcutaneous  Q8H  . ipratropium  0.5 mg Nebulization Q6H  . levalbuterol  0.63 mg Nebulization Q6H  . [COMPLETED] magnesium sulfate 1 - 4 g bolus IVPB  2 g Intravenous Once  . metronidazole  500 mg Intravenous Q8H  . [COMPLETED] potassium chloride  10 mEq Intravenous Q1 Hr x 2  . [COMPLETED] potassium chloride  10 mEq Intravenous Q1 Hr x 2  . potassium chloride  10 mEq Intravenous Q1 Hr x 4  . [COMPLETED] potassium chloride      . [COMPLETED] potassium chloride      . sodium chloride  10-40 mL Intracatheter Q12H  . [DISCONTINUED] famotidine (PEPCID) IV  20 mg Intravenous Q12H  . [DISCONTINUED] HYDROmorphone PCA 0.3 mg/mL   Intravenous Q4H  . [DISCONTINUED] metronidazole  500 mg Intravenous Q6H   Infusions:  . sodium chloride 60 mL/hr at 11/11/12 0700  . diltiazem (CARDIZEM) infusion Stopped (11/11/12 0050)  . TPN (CLINIMIX) +/- additives 40 mL/hr at 11/11/12 0700   And  . fat emulsion 250 mL (11/11/12 0700)    Insulin Requirements in the past 24  hours:  Zero (CBGs [id number])  Current Nutrition:  NPO  Assessment: 62 y/o male patient admitted 2/7 with abdominal pain and n/v/d, found to have ischemic small bowel needing resection. Now POD 3 s/p sigmoid colectomy and colostomy requiring TPN for anticipated prolonged NPO status.   Nutritional Goals:  2300-2400 kCal, 125-140 grams of protein per day  Goal 173ml/hr=weekly average 2317 kcal and 120g protein daily  GI: . Pod#3 s/p exlap with bowel resection. NPO. Abdomen distended and tender. +wound vac and NG tube.Ostomy continues to have necrosis but there is some viable tissue at edges and internally Endo: no h/o DM, CBGs since TPN started 103-121 (no SSI) Lytes: K low 3.1 today. Renal: Scr 0.84. UOP 1105cc/24h (0.5). I/O +1705 Pulm: 92% sat on 2L [place] Cards: 158/77, HR 91 Hepatobil: LFT, tbili wnl, Triglycerides 185 at baseline Neuro: no c/o ID:   Poss enteritis, started on cipro /flagyl Best Practices: SQ heparin. Famotidine in TPN TPN  Access: PICC placed 2/10 TPN day#:1 IVF: NS at 90ml/hr, decrease to kvo today with new TPN   Plan:  1.Increase Clinimix E 5/20 to 5ml/hr 2.Decrease IVF to kvo 3.Check cbg's q4h 4.MVI and lipids MWF 5. K runs x 4   Cerena Baine S. Merilynn Finland, PharmD, [facility] Clinical Staff Pharmacist Pager [phone]  11/11/2012,8:03 AM

## 2012-11-11 NOTE — Progress Notes (Signed)
3 Days Post-Op  Subjective: Awake, comfortable Remains NPO with NG Denies significant pain   Objective: Vital signs in last 24 hours: Temp:  [99.3 F (37.4 C)-100.1 F (37.8 C)] 99.5 F (37.5 C) (02/11 0700) Pulse Rate:  [87-104] 91 (02/11 0700) Resp:  [13-26] 16 (02/11 0700) BP: (125-158)/(63-87) 158/77 mmHg (02/11 0700) SpO2:  [92 %-100 %] 92 % (02/11 0700) Weight:  [213 lb 6.5 oz (96.8 kg)] 213 lb 6.5 oz (96.8 kg) (02/11 0452) Last BM Date: 11/10/12  Intake/Output from previous day: 02/10 0701 - 02/11 0700 In: 2920 [I.V.:1420; IV Piggyback:800; TPN:700] Out: 1205 [Urine:1105; Emesis/NG output:50; Drains:50] Intake/Output this shift:    Abdomen distended, minimally tender with VAC in place Ostomy continues to have necrosis but there is some viable tissue at edges and internally  Lab Results:   Recent Labs  11/10/12 0400 11/11/12 0434  WBC 12.4* 11.0*  HGB 8.2* 7.6*  HCT 23.8* 22.7*  PLT 193 223   BMET  Recent Labs  11/10/12 0400 11/11/12 0434  NA 140 142  K 3.5 3.1*  CL 108 106  CO2 25 30  GLUCOSE 101* 127*  BUN 20 20  CREATININE 0.93 0.84  CALCIUM 8.2* 8.1*   PT/INR No results found for this basename: LABPROT, INR,  in the last 72 hours ABG No results found for this basename: PHART, PCO2, PO2, HCO3,  in the last 72 hours  Studies/Results: Dg Chest Port 1 View  11/10/2012  *RADIOLOGY REPORT*  Clinical Data: Patient pulled central line; check tip position.  PORTABLE CHEST - 1 VIEW  Comparison: Chest radiograph performed 11/09/2012  Findings: The patient's right IJ line is noted barely overlying the right internal jugular vein at its tip.  The lungs are mildly hypoexpanded.  There is a likely small left pleural effusion, with associated airspace opacity.  Mild right basilar atelectasis is noted.  Vascular crowding is seen.  No pneumothorax is identified.  The cardiomediastinal silhouette is normal in size.  No acute osseous abnormalities are identified.   IMPRESSION:  1.  Right IJ line noted near the overlying the right internal jugular vein, at its tip. 2.  Lungs mildly hypoexpanded; likely persistent small left pleural effusion, with associated airspace opacity.  Mild right basilar atelectasis noted.  These results were called by telephone on 11/10/2012 at 02:16 a.m. to [facility] on [id number], who verbally acknowledged these results.   Original Report Authenticated By: Tonia Ghent, M.D.     Anti-infectives: Anti-infectives   Start     Dose/Rate Route Frequency Ordered Stop   11/10/12 1400  metroNIDAZOLE (FLAGYL) IVPB 500 mg     500 mg 100 mL/hr over 60 Minutes Intravenous Every 8 hours 11/10/12 1036     11/07/12 1800  vancomycin (VANCOCIN) 50 mg/mL oral solution 500 mg  Status:  Discontinued     500 mg Oral 4 times per day 11/07/12 1649 11/08/12 1934   11/07/12 1800  metroNIDAZOLE (FLAGYL) IVPB 500 mg  Status:  Discontinued     500 mg 100 mL/hr over 60 Minutes Intravenous 4 times per day 11/07/12 1649 11/10/12 1036   11/07/12 1700  ciprofloxacin (CIPRO) IVPB 400 mg     400 mg 200 mL/hr over 60 Minutes Intravenous Every 12 hours 11/07/12 1649     11/07/12 1336  ceFEPIme (MAXIPIME) 1 G injection    Comments:  HENSON, VICKIE: cabinet override      11/07/12 1336 11/07/12 1356   11/07/12 1230  vancomycin (VANCOCIN) IVPB 1000 mg/200 mL  premix     1,000 mg 200 mL/hr over 60 Minutes Intravenous  Once 11/07/12 1226 11/07/12 1515   11/07/12 1230  ceFEPIme (MAXIPIME) 1 g in dextrose 5 % 50 mL IVPB  Status:  Discontinued     1 g 100 mL/hr over 30 Minutes Intravenous  Once 11/07/12 1226 11/07/12 1736      Assessment/Plan: s/p Procedure(s): EXPLORATORY LAPAROTOMY with descending and sigmoid colectomy; wound vac placement and colostomy (N/A)   Continue NPO and NG Will continue close monitoring of the ostomy.  May very well need revision  LOS: 4 days    Vernona Peake A 11/11/2012

## 2012-11-12 DIAGNOSIS — K5289 Other specified noninfective gastroenteritis and colitis: Secondary | ICD-10-CM

## 2012-11-12 DIAGNOSIS — R6510 Systemic inflammatory response syndrome (SIRS) of non-infectious origin without acute organ dysfunction: Secondary | ICD-10-CM

## 2012-11-12 LAB — URINE MICROSCOPIC-ADD ON

## 2012-11-12 LAB — URINALYSIS, ROUTINE W REFLEX MICROSCOPIC
Glucose, UA: NEGATIVE mg/dL
Ketones, ur: NEGATIVE mg/dL
Protein, ur: NEGATIVE mg/dL
pH: 6.5 (ref 5.0–8.0)

## 2012-11-12 LAB — BASIC METABOLIC PANEL
GFR calc Af Amer: >90 mL/min (ref >90)
GFR calc non Af Amer: 88 mL/min — ABNORMAL LOW (ref >90)
Potassium: 3 mEq/L — ABNORMAL LOW (ref 3.5–5.1)
Sodium: 143 mEq/L (ref 135–145)

## 2012-11-12 LAB — GLUCOSE, CAPILLARY
Glucose-Capillary: 135 mg/dL — ABNORMAL HIGH (ref 70–99)
Glucose-Capillary: 141 mg/dL — ABNORMAL HIGH (ref 70–99)
Glucose-Capillary: 142 mg/dL — ABNORMAL HIGH (ref 70–99)

## 2012-11-12 MED ORDER — POTASSIUM CHLORIDE 10 MEQ/50ML IV SOLN
10.0000 meq | INTRAVENOUS | Status: AC
  Administered 2012-11-12 (×4): 10 meq via INTRAVENOUS
  Filled 2012-11-12 (×4): qty 50

## 2012-11-12 MED ORDER — POTASSIUM CHLORIDE 10 MEQ/50ML IV SOLN
10.0000 meq | INTRAVENOUS | Status: DC
  Filled 2012-11-12 (×2): qty 50

## 2012-11-12 MED ORDER — TRACE MINERALS CR-CU-F-FE-I-MN-MO-SE-ZN IV SOLN
INTRAVENOUS | Status: AC
  Administered 2012-11-12: 18:00:00 via INTRAVENOUS
  Filled 2012-11-12: qty 2000

## 2012-11-12 MED ORDER — FAT EMULSION 20 % IV EMUL
240.0000 mL | INTRAVENOUS | Status: AC
  Administered 2012-11-12: 240 mL via INTRAVENOUS
  Filled 2012-11-12: qty 250

## 2012-11-12 MED ORDER — FUROSEMIDE 10 MG/ML IJ SOLN
40.0000 mg | Freq: Once | INTRAMUSCULAR | Status: AC
  Administered 2012-11-12: 40 mg via INTRAVENOUS
  Filled 2012-11-12: qty 4

## 2012-11-12 NOTE — Progress Notes (Addendum)
PARENTERAL NUTRITION CONSULT NOTE - F/U  Pharmacy Consult for TPN Indication: ileus s/p bowel resection; prolonged NPO status expected  Allergies  Allergen Reactions  . Amlodipine Besy-Benazepril Hcl     REACTION: sob  . Meloxicam     REACTION: stomach upset  . Penicillins     REACTION: Rash    Patient Measurements: Height: 6\' 2"  (188 cm) Weight: 212 lb 4.8 oz (96.299 kg) IBW/kg (Calculated) : 82.2 Adjusted Body Weight: 82.9kg  Vital Signs: Temp: 98.3 F (36.8 C) (02/12 0406) Temp src: Oral (02/12 0406) BP: 157/75 mmHg (02/12 0406) Pulse Rate: 96 (02/12 0406) Intake/Output from previous day: 02/11 0701 - 02/12 0700 In: 2230.5 [I.V.:273; NG/GT:20; IV Piggyback:750; TPN:1187.5] Out: 1520 [Urine:1350; Emesis/NG output:170] Intake/Output from this shift:    Labs:  Recent Labs  11/10/12 0400 11/11/12 0434  WBC 12.4* 11.0*  HGB 8.2* 7.6*  HCT 23.8* 22.7*  PLT 193 223     Recent Labs  11/10/12 0400 11/11/12 0434 11/12/12 0515  NA 140 142 143  K 3.5 3.1* 3.0*  CL 108 106 106  CO2 25 30 31   GLUCOSE 101* 127* 147*  BUN 20 20 22   CREATININE 0.93 0.84 0.91  CALCIUM 8.2* 8.1* 8.2*  MG 1.7 1.9 1.9  PHOS  --  2.8 3.4  PROT  --  5.4*  --   ALBUMIN  --  2.2*  --   AST  --  33  --   ALT  --  21  --   ALKPHOS  --  89  --   BILITOT  --  0.2*  --   PREALBUMIN  --  8.5*  --   TRIG  --  185*  --   CHOL  --  86  --    Estimated Creatinine Clearance: 96.6 ml/min (by C-G formula based on Cr of 0.91).    Recent Labs  11/11/12 2018 11/12/12 0004 11/12/12 0405  GLUCAP 142* 141* 135*    Insulin Requirements in the past 24 hours:  None  Current Nutrition:  NPO  Assessment: 62 y/o male patient admitted 2/7 with abdominal pain and n/v/d, found to have ischemic small bowel needing resection. Now POD 4 s/p sigmoid colectomy and colostomy requiring TPN for anticipated prolonged NPO status/ileus.   Nutritional Goals:  2300-2400 kCal, 125-140 grams of protein  per day  Goal 144ml/hr=weekly average 2317 kcal and 120g protein daily  GI: . Pod#4 s/p exlap with bowel resection. NPO. Abdomen distended and tender. +wound vac and NG tube with ongoing output ( last 24h).Ostomy functions, but noted some concerns with necrosis, and possibe plans for re-do. Endo: no h/o DM, CBGs since TPN started 103-121 (no SSI) Lytes: K low 3.0 today. Mg and Phos are nml with TPN advanced last PM. Other lytes ok. Renal: Scr stable, UOP is good, 0.6. Pulm: RA Cards: VSS Hepatobil: LFT, tbili wnl, Triglycerides 185 at baseline. Prealbumin is low, which is not alarming post-op and with ongoing inflammation. Would expect slow trend up. ID:   enteritis, Cipro/Flagyl D#5, cx negative, cdiff neg. Afeb, WBC 11, decreasing. Best Practices: SQ heparin. Famotidine in TPN. Noted patient started on IV PPI ( was on PO PPI PTA).  TPN Access: PICC placed 2/10 TPN day#: 1 IVF: NS at kvo  Plan:  - Continue Clinimix E 5/20 at 58ml/hr, will hold off on advancing until serum K is nml - d/c q4h CBG checks - MVI and lipids MWF - K runs x 4 ordered, will add 4  more runs for total of 8 runs today. - Will f/up AM labs  Thanks, Everley Evora K. Allena Katz, PharmD, BCPS.  Clinical Pharmacist Pager [phone]. 11/12/2012 8:52 AM

## 2012-11-12 NOTE — Progress Notes (Signed)
Physical Therapy Treatment Patient Details Name: James [name] MRN: [id number] DOB: [birth year] Today's Date: 11/12/2012 Time: 0940-1010 PT Time Calculation (min): 30 min  PT Assessment / Plan / Recommendation Comments on Treatment Session  Patient tolerating increased ambulation distance and improving independence with transitions.  Continues to give into abdominal issues with postural changes.    Follow Up Recommendations  Home health PT           Equipment Recommendations  Rolling walker with 5" wheels       Frequency Min 3X/week   Plan Discharge plan remains appropriate    Precautions / Restrictions Precautions Precautions: Fall Precaution Comments: Abdominal wound vac, NG tube   Pertinent Vitals/Pain Denies pain initially just recently got pain meds    Mobility  Bed Mobility Bed Mobility: Not assessed Details for Bed Mobility Assistance: pt in recliner Transfers Sit to Stand: 4: Min guard;From chair/3-in-1;With armrests Stand to Sit: 4: Min guard;To chair/3-in-1;With armrests Details for Transfer Assistance: low chair for tall man; slow and heavy UE reliance Ambulation/Gait Ambulation/Gait Assistance: 4: Min guard Ambulation Distance (Feet): 250 Feet Assistive device: Rolling walker Ambulation/Gait Assistance Details: slow pace and forward flexed    Exercises General Exercises - Lower Extremity Hip Flexion/Marching: AROM;Both;5 reps;Seated Toe Raises: AROM;Both;10 reps;Seated Heel Raises: AROM;10 reps;Both;Seated Amputee Exercises Chair Push Up: AROM;Both;5 reps;Seated    PT Goals Acute Rehab PT Goals Pt will go Sit to Stand: with supervision PT Goal: Sit to Stand - Progress: Progressing toward goal Pt will go Stand to Sit: with supervision PT Goal: Stand to Sit - Progress: Progressing toward goal Pt will Ambulate: >150 feet;with supervision PT Goal: Ambulate - Progress: Progressing toward goal  Visit Information  Last PT Received On:  11/12/12 Assistance Needed: +1    Subjective Data  Subjective: Reports didn't sleep well   Cognition  Cognition Overall Cognitive Status: Appears within functional limits for tasks assessed/performed Arousal/Alertness: Awake/alert Orientation Level: Appears intact for tasks assessed Behavior During Session: [facility] for tasks performed    Balance  Balance Balance Assessed: Yes Static Standing Balance Static Standing - Balance Support: Bilateral upper extremity supported Static Standing - Level of Assistance: 5: Stand by assistance  End of Session PT - End of Session Equipment Utilized During Treatment: Gait belt Activity Tolerance: Patient tolerated treatment well Patient left: in chair;with call bell/phone within reach;Other (comment) (reconnected NG to suction)   GP     Paden Kuras,CYNDI 11/12/2012, 12:23 PM Sheran Lawless, [place] [phone] 11/12/2012

## 2012-11-12 NOTE — Evaluation (Signed)
Occupational Therapy Evaluation Patient Details Name: James [name] MRN: [id number] DOB: [birth year] Today's Date: 11/12/2012 Time: 4098-1191 OT Time Calculation (min): 13 min  OT Assessment / Plan / Recommendation Clinical Impression  Pt adm with ischemic colitis and underwent colectomy with colostomy.  Pt will benefit from acute OT services to address below problem list in prep for return home with 24/7 sup/assist.    OT Assessment  Patient needs continued OT Services    Follow Up Recommendations  No OT follow up;Supervision/Assistance - 24 hour    Barriers to Discharge None    Equipment Recommendations  None recommended by OT    Recommendations for Other Services    Frequency  Min 2X/week    Precautions / Restrictions Precautions Precautions: Fall Precaution Comments: VAC, NG tube Restrictions Weight Bearing Restrictions: No   Pertinent Vitals/Pain See vitals    ADL  Grooming: Performed;Wash/dry face;Brushing hair;Set up Where Assessed - Grooming: Unsupported sitting Upper Body Bathing: Simulated;Set up Where Assessed - Upper Body Bathing: Unsupported sitting Lower Body Bathing: Simulated;Minimal assistance Where Assessed - Lower Body Bathing: Supported sit to stand Upper Body Dressing: Simulated;Set up Where Assessed - Upper Body Dressing: Unsupported sitting Lower Body Dressing: Simulated;Minimal assistance Where Assessed - Lower Body Dressing: Supported sit to stand Toilet Transfer: Simulated;Minimal assistance Toilet Transfer Method: Sit to Barista:  (chair) Equipment Used: Gait belt;Rolling walker Transfers/Ambulation Related to ADLs: min assist for sit<>stand. Pt declining ambulation due to fatigue. ADL Comments: Pt limited by fatigue due to reporting recently ambulating prior to OT arrival.    OT Diagnosis: Generalized weakness;Acute pain  OT Problem List: Decreased strength;Decreased activity tolerance;Impaired balance (sitting  and/or standing);Decreased knowledge of use of DME or AE;Pain OT Treatment Interventions: Self-care/ADL training;DME and/or AE instruction;Therapeutic activities;Patient/family education;Balance training   OT Goals Acute Rehab OT Goals OT Goal Formulation: With patient Time For Goal Achievement: 11/19/12 Potential to Achieve Goals: Good ADL Goals Pt Will Perform Grooming: with supervision;Standing at sink ADL Goal: Grooming - Progress: Goal set today Pt Will Perform Lower Body Bathing: with supervision;Sit to stand from chair;Sit to stand from bed ADL Goal: Lower Body Bathing - Progress: Goal set today Pt Will Perform Lower Body Dressing: with supervision;Sit to stand from chair;Sit to stand from bed ADL Goal: Lower Body Dressing - Progress: Goal set today Miscellaneous OT Goals Miscellaneous OT Goal #1: Pt will independently verbalize 3/3 energy conservation techniques to use during ADLs. OT Goal: Miscellaneous Goal #1 - Progress: Goal set today Miscellaneous OT Goal #2: Pt will perform all functional mobility at supervision level with RW. OT Goal: Miscellaneous Goal #2 - Progress: Goal set today  Visit Information  Last OT Received On: 11/12/12 Assistance Needed: +1    Subjective Data      Prior Functioning     Home Living Lives With: Spouse Available Help at Discharge: Family;Available 24 hours/day Type of Home: House Home Access: Stairs to enter [employer] of Steps: 2 Home Layout: One level Bathroom Shower/Tub: Engineer, manufacturing systems: Standard Home Adaptive Equipment: Straight cane Prior Function Level of Independence: Independent Able to Take Stairs?: Yes Driving: Yes Vocation: Retired Musician: No difficulties Dominant Hand: Right         Vision/Perception     Copywriter, advertising Overall Cognitive Status: Appears within functional limits for tasks assessed/performed Arousal/Alertness: Awake/alert Orientation  Level: Appears intact for tasks assessed Behavior During Session: [facility] for tasks performed    Extremity/Trunk Assessment Right Upper Extremity Assessment RUE ROM/Strength/Tone: [facility] for  tasks assessed Left Upper Extremity Assessment LUE ROM/Strength/Tone: WFL for tasks assessed     Mobility Bed Mobility Bed Mobility: Not assessed Transfers Transfers: Sit to Stand;Stand to Sit Sit to Stand: 4: Min assist;From chair/3-in-1;With armrests;With upper extremity assist Stand to Sit: 4: Min assist;To chair/3-in-1;With armrests;With upper extremity assist Details for Transfer Assistance: Assist for power up from chair. VC for safe hand placement.     Exercise     Balance Balance Balance Assessed: Yes Static Standing Balance Static Standing - Balance Support: Bilateral upper extremity supported (bil UE on RW) Static Standing - Level of Assistance: 4: Min assist;5: Stand by assistance   End of Session OT - End of Session Equipment Utilized During Treatment: Gait belt Activity Tolerance: Patient limited by fatigue Patient left: in chair;with call bell/phone within reach;with family/visitor present  GO    11/12/2012 Cipriano Mile OTR/L Pager [phone] Office [phone]  Cipriano Mile 11/12/2012, 11:55 AM

## 2012-11-12 NOTE — Progress Notes (Signed)
General Surgery Note  LOS: 5 days  POD -   4 Days Post-Op Room - 2012  Assessment/Plan: 1.  EXPLORATORY LAPAROTOMY with descending and sigmoid colectomy; wound vac placement and colostomy - T. Cornett - 11/08/2012  ON Cipro/Flagyl  Still with ileus.  Leave NGT for now.  Ostomy does not look good, but should function.  No output. 2.  Midline wound VAC  Changed T,Th, Sat  3. DVT prophylaxis - On SQ heparin  Subjective:  Comfortable.  Walked in hall yesterday. Objective:   Filed Vitals:   11/12/12 0406  BP: 157/75  Pulse: 96  Temp: 98.3 F (36.8 C)  Resp: 20     Intake/Output from previous day:  02/11 0701 - 02/12 0700 In: 2230.5 [I.V.:273; NG/GT:20; IV Piggyback:750; TPN:1187.5] Out: 1520 [Urine:1350; Emesis/NG output:170]  Intake/Output this shift:      Physical Exam:   General: WN older WM who is alert and oriented.    HEENT: Normal. Pupils equal. .   Lungs: Clear   Abdomen: Distended, I do not hear bowel sounds.  But does not have any localized tenderness.   Wound: VAC on midline.  Left abdominal colostomy brown/gray   Uro:  Has condom cath on because he tends to dribble.   Neurologic:  Grossly intact to motor and sensory function.      Lab Results:    Recent Labs  11/10/12 0400 11/11/12 0434  WBC 12.4* 11.0*  HGB 8.2* 7.6*  HCT 23.8* 22.7*  PLT 193 223    BMET   Recent Labs  11/11/12 0434 11/12/12 0515  NA 142 143  K 3.1* 3.0*  CL 106 106  CO2 30 31  GLUCOSE 127* 147*  BUN 20 22  CREATININE 0.84 0.91  CALCIUM 8.1* 8.2*    PT/INR  No results found for this basename: LABPROT, INR,  in the last 72 hours  ABG  No results found for this basename: PHART, PCO2, PO2, HCO3,  in the last 72 hours   Studies/Results:  No results found.   Anti-infectives:   Anti-infectives   Start     Dose/Rate Route Frequency Ordered Stop   11/10/12 1400  metroNIDAZOLE (FLAGYL) IVPB 500 mg     500 mg 100 mL/hr over 60 Minutes Intravenous Every 8 hours 11/10/12  1036     11/07/12 1800  vancomycin (VANCOCIN) 50 mg/mL oral solution 500 mg  Status:  Discontinued     500 mg Oral 4 times per day 11/07/12 1649 11/08/12 1934   11/07/12 1800  metroNIDAZOLE (FLAGYL) IVPB 500 mg  Status:  Discontinued     500 mg 100 mL/hr over 60 Minutes Intravenous 4 times per day 11/07/12 1649 11/10/12 1036   11/07/12 1700  ciprofloxacin (CIPRO) IVPB 400 mg     400 mg 200 mL/hr over 60 Minutes Intravenous Every 12 hours 11/07/12 1649     11/07/12 1336  ceFEPIme (MAXIPIME) 1 G injection    Comments:  HENSON, VICKIE: cabinet override      11/07/12 1336 11/07/12 1356   11/07/12 1230  vancomycin (VANCOCIN) IVPB 1000 mg/200 mL premix     1,000 mg 200 mL/hr over 60 Minutes Intravenous  Once 11/07/12 1226 11/07/12 1515   11/07/12 1230  ceFEPIme (MAXIPIME) 1 g in dextrose 5 % 50 mL IVPB  Status:  Discontinued     1 g 100 mL/hr over 30 Minutes Intravenous  Once 11/07/12 1226 11/07/12 1736      Ovidio Kin, MD, FACS Pager: [phone],  [facility] Office: [phone] 11/12/2012

## 2012-11-12 NOTE — Progress Notes (Signed)
PULMONARY  / CRITICAL CARE MEDICINE  Name: James [name] MRN: [id number] DOB: 01/02/1951    ADMISSION DATE:  11/07/2012  BRIEF PATIENT DESCRIPTION: 62 y/o WM with PMH of COPD, Lung cancer s/p resection at [facility] who presented to [facility] with acute onset abdominal pain with associated n/v/d. Tx to [facility] ICU for further eval.   SIGNIFICANT EVENTS / STUDIES:  2/07 - CT ABD / Pelvis>>>Findings are suggestive of enteritis. No associated abscess or small bowel dilatation.  2/08 - descending/sigmoid colectomy with wound vac placement  2/09 - A fib with RVR  2/11 - to floor, NGT in place for ileus, stoma dark but surgery thinks should function  LINES / TUBES:  R IJ TLC 2/7>>> self dc'ed 2/9  picc 2/10 rt>>>  CULTURES:  BCx2 2/7>>> Negative Stool Culture>>> Negative Cdiff PCR>>>negative  Urine 2/7 >>> negative   ANTIBIOTICS:  Flagyl 2/7>>>  Cipro 2/7>>>  Vanco PO 2/7>>>2/8   SUBJECTIVE: Up to chair, denies abd pain.  Feels a "little drugged"   VITAL SIGNS: Temp:  [98.3 F (36.8 C)-99.7 F (37.6 C)] 98.3 F (36.8 C) (02/12 0406) Pulse Rate:  [90-108] 96 (02/12 0406) Resp:  [15-22] 20 (02/12 0406) BP: (138-157)/(65-81) 157/75 mmHg (02/12 0406) SpO2:  [91 %-98 %] 94 % (02/12 1018) Weight:  [212 lb 4.8 oz (96.299 kg)] 212 lb 4.8 oz (96.299 kg) (02/12 0406)  INTAKE / OUTPUT: Intake/Output     02/11 0701 - 02/12 0700 02/12 0701 - 02/13 0700   I.V. (mL/kg) 273 (2.8)    NG/GT 20    IV Piggyback 750    TPN 1187.5    Total Intake(mL/kg) 2230.5 (23.2)    Urine (mL/kg/hr) 1350 (0.6)    Emesis/NG output 170 (0.1)    Drains     Total Output 1520     Net +710.5            PHYSICAL EXAMINATION: General: wdwn adult male, NAD Neuro: Alert, mildly confused  HEENT: mm pink/moist, NGT to LWS Cardiovascular: RRR. Normal s1s2 no murmurs Lungs: decreased BS, no wheeze  Abdomen: Colostomy LLQ, wound dressing clean  Musculoskeletal: No edema  Skin: no rashes  LABS:  Recent  Labs Lab 11/07/12 1220 11/07/12 1839 11/07/12 1841  11/08/12 1930  11/09/12 0142 11/09/12 0415 11/09/12 0830 11/09/12 1540 11/10/12 0400 11/11/12 0434 11/12/12 0515  HGB 16.4  --   --   < > 9.7*  --  9.3*  --   --   --  8.2* 7.6*  --   WBC 25.9*  --   --   < > 12.8*  --  12.2*  --   --   --  12.4* 11.0*  --   PLT 421*  --   --   < > 244  --  227  --   --   --  193 223  --   NA 133*  --   --   < > 136  --  137  --   --   --  140 142 143  K 4.1  --   --   < > 3.6  --  3.6  --   --   --  3.5 3.1* 3.0*  CL 93*  --   --   < > 105  --  106  --   --   --  108 106 106  CO2 24  --   --   < > 25  --  24  --   --   --  25 30 31   GLUCOSE 163*  --   --   < > 168*  --  115*  --   --   --  101* 127* 147*  BUN 27*  --   --   < > 22  --  22  --   --   --  20 20 22   CREATININE 1.20  --   --   < > 1.13  --  1.11  --   --   --  0.93 0.84 0.91  CALCIUM 10.2  --   --   < > 7.5*  --  7.7*  --   --   --  8.2* 8.1* 8.2*  MG  --   --   --   --   --   < > 1.7  --   --   --  1.7 1.9 1.9  PHOS  --   --   --   --   --   --   --   --   --   --   --  2.8 3.4  AST 26  --   --   < > 19  --  23  --   --   --   --  33  --   ALT 20  --   --   < > 13  --  15  --   --   --   --  21  --   ALKPHOS 299*  --   --   < > 129*  --  120*  --   --   --   --  89  --   BILITOT 0.4  --   --   < > 0.4  --  0.3  --   --   --   --  0.2*  --   PROT 9.0*  --   --   < > 5.0*  --  5.1*  --   --   --   --  5.4*  --   ALBUMIN 4.6  --   --   < > 2.4*  --  2.4*  --   --   --   --  2.2*  --   APTT 37  --   --   --   --   --   --   --   --   --   --   --   --   INR 0.98  --   --   --   --   --   --   --   --   --   --   --   --   LATICACIDVEN 1.4 1.9  --   --   --   --   --   --   --   --   --   --   --   TROPONINI <0.30  --   --   --   --   --   --  <0.30 <0.30 <0.30  --   --   --   PROCALCITON  --   --  1.07  --   --   --   --   --   --   --   --   --   --   < > = values in this interval not displayed.  Recent Labs Lab 11/11/12 1245  11/11/12 1641 11/11/12 2018 11/12/12 0004 11/12/12 0405  GLUCAP 138* 128* 142* 141* 135*    CXR: 11/10/12  IMPRESSION:  Lungs mildly hypoexpanded; likely persistent small left pleural effusion, with associated airspace opacity. Mild right basilar atelectasis noted.   ASSESSMENT / PLAN:  PULMONARY  A:  Lower lobe atelectasis - in setting of pain / abdominal splinting  COPD - on symbicort at baseline   P:  -scheduled BD's >>xopenex due to tachycardia  -bronchial hygiene  -oxygen to keep SpO2 > 92%  -f/u CXR as needed   CARDIOVASCULAR  A:  Hypovolemic Shock - in setting of ischemic colitis; resolved  Swollen upper lip - eyelid and lip edema, no tongue involvement, unclear etiology; improved 2/8  Atrial Fibrillation - noted in [facility], and again 2/9 >> resolved 2/11.  Intermittent in ICU.  TSH WNL.  ECHO with grade 1 diastolic dysfunction, septal flattening suggestive of possivle RV volume & pressure overload, RV mildly dilated  P:   -if Afib reoccurs, consider amio  -no hep drip as post op and concerns ischemia to ostomy    RENAL  A:  Hyponatremia, Hypochloremia >> resolved.  Hypokalemia, hypomagnesemia   P:  -f/u BMET  -correct K, mag -kvo, avoid chf?, int markings increased -lasix x1 2/12  GASTROINTESTINAL  A:  Reflux? GERD Ischemic colitis s/p laparotomy 2/8.   P:  -post op care, nutrition per [facility], they are observing ostomy  -TPN -protonix 40 bid  HEMATOLOGIC  A:  Leukocytosis >> improved.  Anemia of critical illness.   P:  -f/u CBC  -Transfuse for Hb < 7, no indication now -sub q heparin   INFECTIOUS  A:  N/V/D with associated Acute ABD Pain   P:  -continue cipro, flagyl, concern ostomy ischemia, keep on ABX -? Repeat OR time  ENDOCRINE  A:  Hyperglycemia - noted on initial BMP   P:  -monitor BMP glucose   NEUROLOGIC  A:  Pain controlled dilerium improved, caused by PCA.   P:  -behavioral modifiers  -may need haldol  -chair,  mobilize -PT consult    Will transfer to [facility] in am 2/13 0700.  PCCM will sign off.  Please call back for assistance if needed.   Canary Brim, NP-C [facility] Pgr: [phone] or [phone]   I agree with the above note Caryl Bis  [phone]  Cell  [phone]  If no response or cell goes to voicemail, call beeper [phone]

## 2012-11-12 NOTE — Progress Notes (Signed)
11/12/2012 1:41 PM Nursing note RN called to room pt. C/o severe scrotal pain and swelling as well as difficulty voiding. Bladder scan performed showing 950 cc residual urine. Canary Brim NP paged and made aware. Orders received to place foley catheter. Orders enacted. Pt. Stated immediate relief of pain. Will continue to monitor. Ashby Leflore, Blanchard Kelch

## 2012-11-12 NOTE — Progress Notes (Signed)
Set pt.'s CPAP up at bedside on 6 cm H2O via FFM (what pt. wears at home.) Pt. Stated that he would place himself on CPAP when ready for bed.

## 2012-11-13 DIAGNOSIS — K219 Gastro-esophageal reflux disease without esophagitis: Secondary | ICD-10-CM

## 2012-11-13 DIAGNOSIS — K559 Vascular disorder of intestine, unspecified: Secondary | ICD-10-CM | POA: Diagnosis present

## 2012-11-13 DIAGNOSIS — Z8679 Personal history of other diseases of the circulatory system: Secondary | ICD-10-CM

## 2012-11-13 LAB — COMPREHENSIVE METABOLIC PANEL
ALT: 26 U/L (ref 0–53)
AST: 34 U/L (ref 0–37)
Albumin: 2.3 g/dL — ABNORMAL LOW (ref 3.5–5.2)
CO2: 29 mEq/L (ref 19–32)
Calcium: 8.1 mg/dL — ABNORMAL LOW (ref 8.4–10.5)
Chloride: 103 mEq/L (ref 96–112)
GFR calc non Af Amer: >90 mL/min (ref >90)
Sodium: 142 mEq/L (ref 135–145)
Total Bilirubin: 0.2 mg/dL — ABNORMAL LOW (ref 0.3–1.2)

## 2012-11-13 LAB — CBC
HCT: 23.2 % — ABNORMAL LOW (ref 39.0–52.0)
Hemoglobin: 7.8 g/dL — ABNORMAL LOW (ref 13.0–17.0)
MCV: 86.9 fL (ref 78.0–100.0)
RDW: 13.9 % (ref 11.5–15.5)
WBC: 9.7 10*3/uL (ref 4.0–10.5)

## 2012-11-13 LAB — BASIC METABOLIC PANEL
Calcium: 7.8 mg/dL — ABNORMAL LOW (ref 8.4–10.5)
GFR calc non Af Amer: >90 mL/min (ref >90)
Sodium: 137 mEq/L (ref 135–145)

## 2012-11-13 LAB — PHOSPHORUS: Phosphorus: 4.2 mg/dL (ref 2.3–4.6)

## 2012-11-13 MED ORDER — IPRATROPIUM BROMIDE 0.02 % IN SOLN
0.5000 mg | Freq: Four times a day (QID) | RESPIRATORY_TRACT | Status: DC
  Administered 2012-11-14 (×2): 0.5 mg via RESPIRATORY_TRACT
  Filled 2012-11-13: qty 2.5

## 2012-11-13 MED ORDER — LEVALBUTEROL HCL 0.63 MG/3ML IN NEBU
0.6300 mg | INHALATION_SOLUTION | Freq: Four times a day (QID) | RESPIRATORY_TRACT | Status: DC
  Administered 2012-11-14 (×2): 0.63 mg via RESPIRATORY_TRACT
  Filled 2012-11-13 (×5): qty 3

## 2012-11-13 MED ORDER — DILTIAZEM HCL 100 MG IV SOLR
5.0000 mg/h | INTRAVENOUS | Status: DC
  Administered 2012-11-13 – 2012-11-15 (×4): 5 mg/h via INTRAVENOUS
  Filled 2012-11-13 (×5): qty 100

## 2012-11-13 MED ORDER — POTASSIUM CHLORIDE 10 MEQ/50ML IV SOLN
10.0000 meq | INTRAVENOUS | Status: AC
  Administered 2012-11-13 (×4): 10 meq via INTRAVENOUS
  Filled 2012-11-13 (×4): qty 50

## 2012-11-13 MED ORDER — MAGNESIUM SULFATE 40 MG/ML IJ SOLN
4.0000 g | Freq: Once | INTRAMUSCULAR | Status: AC
  Administered 2012-11-13: 4 g via INTRAVENOUS
  Filled 2012-11-13: qty 100

## 2012-11-13 MED ORDER — CLINIMIX E/DEXTROSE (5/20) 5 % IV SOLN
INTRAVENOUS | Status: AC
  Administered 2012-11-13: 17:00:00 via INTRAVENOUS
  Filled 2012-11-13: qty 2000

## 2012-11-13 MED ORDER — POTASSIUM CHLORIDE 10 MEQ/50ML IV SOLN
10.0000 meq | INTRAVENOUS | Status: AC
  Administered 2012-11-13 (×8): 10 meq via INTRAVENOUS
  Filled 2012-11-13 (×8): qty 50

## 2012-11-13 NOTE — Progress Notes (Signed)
Agree with A&P of MD,PA. I believe the colostomy is going to be viable.

## 2012-11-13 NOTE — Progress Notes (Signed)
eLink Physician-Brief Progress Note Patient Name: James [name] DOB: 1951/09/12 MRN: [id number]  Date of Service  11/13/2012   HPI/Events of Note   BACK IN A FIB RVR PER RN  eICU Interventions  RESTART CARDIZEM GTT   Intervention Category Major Interventions: Arrhythmia - evaluation and management  Amylah Will 11/13/2012, 3:20 AM

## 2012-11-13 NOTE — Progress Notes (Signed)
Pt is unable to wear CPAP due to NG tube in nose. Pt also stated that he did not want to wear the CPAP. RT will continue to monitor.

## 2012-11-13 NOTE — Progress Notes (Signed)
Rn notified by monitor tech that pt had gone into Atrial fib at 0200 hrs with controlled rate100-110 but at 0300  Pt noted to be in Atrial fib with RVR rate 130-150s.  BP 140/65.  Pt asymptomatic.  Placed on 2l [place] for RA sats 89% with increase to 91%. O2 raised to 4l with sats 94%.  Dr Marchelle Gearing notified of Atrail fib.  Orders received for cardizem gtt.  Will continue to monitor pt.

## 2012-11-13 NOTE — Progress Notes (Addendum)
PARENTERAL NUTRITION CONSULT NOTE - F/U  Pharmacy Consult for TPN Indication: ileus s/p bowel resection; prolonged NPO status expected  Allergies  Allergen Reactions  . Amlodipine Besy-Benazepril Hcl     REACTION: sob  . Meloxicam     REACTION: stomach upset  . Penicillins     REACTION: Rash    Patient Measurements: Height: 6\' 3"  (190.5 cm) Weight: 205 lb 14.6 oz (93.4 kg) IBW/kg (Calculated) : 84.5 Adjusted Body Weight: 82.9kg  Vital Signs: Temp: 99.7 F (37.6 C) (02/13 0300) Temp src: Oral (02/13 0300) BP: 134/66 mmHg (02/13 0600) Pulse Rate: 103 (02/13 0600) Intake/Output from previous day: 02/12 0701 - 02/13 0700 In: 2120 [I.V.:540; IV Piggyback:400; TPN:1180] Out: 5070 [Urine:5050; Stool:20] Intake/Output from this shift:    Labs:  Recent Labs  11/11/12 0434 11/13/12 0500  WBC 11.0* 9.7  HGB 7.6* 7.8*  HCT 22.7* 23.2*  PLT 223 306     Recent Labs  11/11/12 0434 11/12/12 0515 11/13/12 0500  NA 142 143 142  K 3.1* 3.0* 2.8*  CL 106 106 103  CO2 30 31 29   GLUCOSE 127* 147* 155*  BUN 20 22 21   CREATININE 0.84 0.91 0.86  CALCIUM 8.1* 8.2* 8.1*  MG 1.9 1.9 1.6  PHOS 2.8 3.4 4.2  PROT 5.4*  --  5.4*  ALBUMIN 2.2*  --  2.3*  AST 33  --  34  ALT 21  --  26  ALKPHOS 89  --  60  BILITOT 0.2*  --  0.2*  PREALBUMIN 8.5*  --   --   TRIG 185*  --   --   CHOL 86  --   --    Estimated Creatinine Clearance: 105.1 ml/min (by C-G formula based on Cr of 0.86).    Recent Labs  11/11/12 2018 11/12/12 0004 11/12/12 0405  GLUCAP 142* 141* 135*    Insulin Requirements in the past 24 hours:  None  Current Nutrition:  NPO  Assessment: 62 y/o male patient admitted 2/7 with abdominal pain and n/v/d, found to have ischemic small bowel needing resection. Now POD 5 s/p sigmoid colectomy and colostomy requiring TPN for anticipated prolonged NPO status/ileus.   Nutritional Goals:  2300-2400 kCal, 125-140 grams of protein per day  Goal 173ml/hr=weekly  average 2317 kcal and 120g protein daily  GI: . Pod#5 s/p exlap with bowel resection. NPO. NG tube without any output the last 24h.Ostomy functions, but noted some concerns with necrosis, and possibe plans for re-do. Noted small stool output over the last 24h.  Endo: no h/o DM, SSI d/c due to good glycemic control on goal TPN  Lytes: K low 2.8 today. Mg is low nml. Other lytes are nml. Appears as though patient is still refeeding to a degree, it would be prudent to have normalized K (~4), before advancing or cycling TPN.  Renal: Scr stable, UOP is good, 2.3, with lasix. Acid-base status is ok  Pulm: 3L Halfway  Cards: VSS  Hepatobil: LFT, tbili wnl, Triglycerides 185 at baseline. Prealbumin is low, which is not alarming post-op and with ongoing inflammation. Would expect slow trend up.  ID:   enteritis, Cipro/Flagyl D#6, cx negative, cdiff neg. Afeb, WBC 11, decreasing.  Best Practices: SQ heparin. Famotidine in TPN (removed 2/12). Noted patient started on IV PPI ( was on PO PPI PTA).   TPN Access: PICC placed 2/10  TPN day#: 3  IVF: NS at kvo  Plan:  - Continue Clinimix E 5/20 at 20ml/hr, will hold  off on advancing until serum K is nml - Will give 4 gm IV Mg and 8 runs of KCL today, will plan to recheck K at 1700 today, and replace if still low. - Will supplement MVI, trace elements and IV fats on MWF due to ongoing national shortages. - Will f/up BMET, Mg and Phos in AM  Thanks, Meera K. Allena Katz, PharmD, BCPS.  Clinical Pharmacist Pager [phone]. 11/13/2012 7:23 AM     11/13/12  Pharmacy- TPN 1840  K remains low at 3.2.  Will give an additional 4 runs of KCl and f/u in AM  Marisue Humble, PharmD Clinical Pharmacist [facility]

## 2012-11-13 NOTE — Progress Notes (Signed)
ANTIBIOTIC CONSULT NOTE - Follow-Up  Pharmacy Consult for IV Cipro and Flagyl Indication: enteritis  Allergies  Allergen Reactions  . Amlodipine Besy-Benazepril Hcl     REACTION: sob  . Meloxicam     REACTION: stomach upset  . Penicillins     REACTION: Rash    Patient Measurements: Height: 6\' 3"  (190.5 cm) Weight: 205 lb 14.6 oz (93.4 kg) IBW/kg (Calculated) : 84.5  Vital Signs: Temp: 99.7 F (37.6 C) (02/13 0300) Temp src: Oral (02/13 0300) BP: 134/66 mmHg (02/13 0600) Pulse Rate: 103 (02/13 0600) Intake/Output from previous day: 02/12 0701 - 02/13 0700 In: 2120 [I.V.:540; IV Piggyback:400; TPN:1180] Out: 5070 [Urine:5050; Stool:20] Intake/Output from this shift:    Labs:  Recent Labs  11/11/12 0434 11/12/12 0515 11/13/12 0500  WBC 11.0*  --  9.7  HGB 7.6*  --  7.8*  PLT 223  --  306  CREATININE 0.84 0.91 0.86   Estimated Creatinine Clearance: 105.1 ml/min (by C-G formula based on Cr of 0.86).    Microbiology: Recent Results (from the past 720 hour(s))  CULTURE, BLOOD (ROUTINE X 2)     Status: None   Collection Time    11/07/12 12:20 PM      Result Value Range Status   Specimen Description Blood   Final   Special Requests NONE   Final   Culture  Setup Time 11/07/2012 18:01   Final   Culture     Final   Value:        BLOOD CULTURE RECEIVED NO GROWTH TO DATE CULTURE WILL BE HELD FOR 5 DAYS BEFORE ISSUING A FINAL NEGATIVE REPORT   Report Status PENDING   Incomplete  CULTURE, BLOOD (ROUTINE X 2)     Status: None   Collection Time    11/07/12  1:30 PM      Result Value Range Status   Specimen Description BLOOD LEFT FOOT   Final   Special Requests NONE BOTTLES DRAWN AEROBIC AND ANAEROBIC 5CC   Final   Culture  Setup Time 11/07/2012 19:48   Final   Culture     Final   Value:        BLOOD CULTURE RECEIVED NO GROWTH TO DATE CULTURE WILL BE HELD FOR 5 DAYS BEFORE ISSUING A FINAL NEGATIVE REPORT   Report Status PENDING   Incomplete  URINE CULTURE      Status: None   Collection Time    11/07/12  1:45 PM      Result Value Range Status   Specimen Description URINE, CATHETERIZED   Final   Special Requests NONE   Final   Culture  Setup Time 11/07/2012 21:06   Final   Colony Count NO GROWTH   Final   Culture NO GROWTH   Final   Report Status 11/08/2012 FINAL   Final  MRSA PCR SCREENING     Status: None   Collection Time    11/07/12  4:00 PM      Result Value Range Status   MRSA by PCR NEGATIVE  NEGATIVE Final   Comment:            The GeneXpert MRSA Assay (FDA     approved for NASAL specimens     only), is one component of a     comprehensive MRSA colonization     surveillance program. It is not     intended to diagnose MRSA     infection nor to guide or     monitor  treatment for     MRSA infections.  URINE CULTURE     Status: None   Collection Time    11/07/12  4:06 PM      Result Value Range Status   Specimen Description URINE, CATHETERIZED   Final   Special Requests NONE   Final   Culture  Setup Time 11/07/2012 16:47   Final   Colony Count NO GROWTH   Final   Culture NO GROWTH   Final   Report Status 11/08/2012 FINAL   Final  CLOSTRIDIUM DIFFICILE BY PCR     Status: None   Collection Time    11/07/12  5:25 PM      Result Value Range Status   C difficile by pcr NEGATIVE  NEGATIVE Final  STOOL CULTURE     Status: None   Collection Time    11/07/12  5:26 PM      Result Value Range Status   Specimen Description STOOL   Final   Special Requests NONE   Final   Culture     Final   Value: NO SALMONELLA, SHIGELLA, CAMPYLOBACTER, YERSINIA, OR E.COLI 0157:H7 ISOLATED   Report Status 11/11/2012 FINAL   Final    Medical History: Past Medical History  Diagnosis Date  . Cancer of lung     s/p resection in 2005 at [facility] / [place] patient  . COPD (chronic obstructive pulmonary disease)   . Hypertension   . High cholesterol    Assessment: Pt on Cipro/Flagyl D#7 for enteritis. 2/7 C.diff neg, afeb, WBC 9.7 - trending down.  S/p  sigmoid colectomy 2/8 for ischemic colitis.  Continues on abx per CCS - may ultimately need colectomy revision.  Also on TPN.  Renal function remains stable.  Cipro 2/7 >> Flagyl 2/7 >>  2/7 urine cx x2 - neg 2/7 bld cx x2 - NGTD 2/7 stool cx - pending  Goal of Therapy:  Eradication of infection  Plan:  - Continue Ciprofloxacin 400mg  IV q 12h - Change Flagyl to 500mg  IV q 8h - With stable renal function anticipate no further dose adjustment of abx will be needed. - Pharmacy will sign-off on abx consult (will still follow for TPN mgmt)  Thanks, [employer], Pharm.D., BCPS Clinical Pharmacist Pager [phone] 11/13/2012 9:51 AM

## 2012-11-13 NOTE — Progress Notes (Signed)
[facility] PROGRESS NOTE  James [name] BJY:[id number] DOB: 06-10-1951 DOA: 11/07/2012 PCP: Sanda Linger, MD  Assessment/Plan: Principal Problem:   Hypovolemic shock Active Problems:   Abdominal pain   Emesis   Diarrhea   Swollen upper lip   Paroxysmal a-fib, spont conversion to NSR   History of hypertension   COPD (chronic obstructive pulmonary disease)   Hyperlipidemia   Hyperglycemia, stress related   Elevated alkaline phosphatase level   SIRS criteria on admission (leukocytosis, hypotension, hypothermia)   Enteritis    1. Ischemic colitis: Patient presented with abdominal pain, nausea and vomiting. Imaging studies were unhelpful, and patient did have a known history of vascular disease, raising concern for bowel ischemia. Dr Luisa Hart provided surgical consultation, and patient underwent emergent exploratory laparotomy with descending and sigmoid colectomy; wound vac placement and colostomy on 11/08/12.  Now POD # 5, managing per CCS. On TPN . Patient has N/V/D with associated abdominal pain, concerning for  ostomy ischemia. On Ciprofloxacin/Flagyl, day#7.  2. Hypovolemic Shock: This was present at presentation and peri-opreratively, in setting of ischemic colitis; resolved.  3. COPD : This appears stable. Patient is on Symbicort at baseline. Managing with scheduled bronchodilators. Utilizing Xopenex due to tachycardia, bronchial hygiene and oxygen supplementation. Keep SpO2 > 92% 4. Swollen upper lip: This was an incidental finding, involving eyelid and lip edema, no tongue involvement, unclear etiology. Now resolved.  5. Atrial Fibrillation: PAF. Episode noted in [facility], and again 11/09/12.  Intermittent in ICU. TSH WNL. ECHO with grade 1 diastolic dysfunction, septal flattening suggestive of possible RV volume & pressure overload, RV mildly dilated. No Heparin drip as post-op and concerns for ischemia to ostomy  6. Electrolyte abnormalities: Hyponatremia, Hypochloremia, now  resolved. Hypokalemia, hypomagnesemia. Following electrolytes and repleting as indicated.  7. Reflux/GERD:  On Protonix 40 bid.  8. Anemia of critical illness: HB ranging between 7.6-8.2. Following CBC. Transfuse for Hb < 7.  9. Delirium: Likely due to opioids/PCA. Managed with reduction of opioids, behavioral modifiers, chair, mobilize. PT consult. Resolved. Mental status appears at baseline today.    Code Status: Full Code.  Family Communication:  Disposition Plan: To be determined.    Brief narrative: 62 y/o WM with PMH of COPD, Lung cancer s/p resection at [facility] who presented to [facility] with acute onset abdominal pain with associated n/v/d. Admitted to [facility] ICU by Dr Billy Fischer, for further evaluation,on suspicion of possible ishemia of colon or small bowel. Surgical consultation was provided by Dr Luisa Hart, and patient is s/p emergent exploratory laparotomy 11/08/2012, with descending and sigmoid colectomy; wound vac placement and colostomy due to increasing abdominal pain with negative radiographic work up, as well as a history of vascular disease warranting concern for ischemic colitis. Clinical course was complicated by hypovolemic shock, electrolyte abnormalities and PAF. Patient was transferred to [facility] service, effective 11/13/12.     Consultants:  PCCM; Billy Fischer.   Dr Luisa Hart, Careers adviser.   Procedures:  Serial CXRs.  Abdominal X-Rays.  Abdominal/Pelvic CTA.    CTA chest.   Antibiotics:  Ciprofloxacin 11/07/12>>>  Flagyl 11/07/12>>>  HPI/Subjective: No new issue. Ambulating.   Objective: Vital signs in last 24 hours: Temp:  [98.4 F (36.9 C)-99.7 F (37.6 C)] 99.7 F (37.6 C) (02/13 0300) Pulse Rate:  [95-142] 103 (02/13 0600) Resp:  [20-22] 20 (02/12 2145) BP: (127-147)/(60-79) 134/66 mmHg (02/13 0600) SpO2:  [89 %-97 %] 97 % (02/13 0500) Weight:  [93.4 kg (205 lb 14.6 oz)] 93.4 kg (205  lb 14.6 oz) (02/13 0415) Weight change: -2.899 kg (-6 lb 6.3  oz) Last BM Date: 11/10/12  Intake/Output from previous day: 02/12 0701 - 02/13 0700 In: 2120 [I.V.:540; IV Piggyback:400; TPN:1180] Out: 5070 [Urine:5050; Stool:20]     Physical Exam: General: Comfortable, alert, communicative, fully oriented, not short of breath at rest. Has NG-T with small amount of bilious drainage.  HEENT:  Moderate clinical pallor, no jaundice, no conjunctival injection or discharge. NECK:  Supple, JVP not seen, no carotid bruits, no palpable lymphadenopathy, no palpable goiter. CHEST:  Clinically clear to auscultation, no wheezes, no crackles. HEART:  Sounds 1 and 2 heard, normal, regular, no murmurs. ABDOMEN:  Full, soft, healing mid-line surgical wound, non-tender, no palpable organomegaly, no palpable masses. Bowel sounds heard, albeit sluggish. GENITALIA:  Not examined. LOWER EXTREMITIES:  No pitting edema, palpable peripheral pulses. MUSCULOSKELETAL SYSTEM:  Generalized osteoarthritic changes, otherwise, normal. CENTRAL NERVOUS SYSTEM:  No focal neurologic deficit on gross examination.  Lab Results:  Recent Labs  11/11/12 0434 11/13/12 0500  WBC 11.0* 9.7  HGB 7.6* 7.8*  HCT 22.7* 23.2*  PLT 223 306    Recent Labs  11/12/12 0515 11/13/12 0500  NA 143 142  K 3.0* 2.8*  CL 106 103  CO2 31 29  GLUCOSE 147* 155*  BUN 22 21  CREATININE 0.91 0.86  CALCIUM 8.2* 8.1*   Recent Results (from the past 240 hour(s))  CULTURE, BLOOD (ROUTINE X 2)     Status: None   Collection Time    11/07/12 12:20 PM      Result Value Range Status   Specimen Description Blood   Final   Special Requests NONE   Final   Culture  Setup Time 11/07/2012 18:01   Final   Culture     Final   Value:        BLOOD CULTURE RECEIVED NO GROWTH TO DATE CULTURE WILL BE HELD FOR 5 DAYS BEFORE ISSUING A FINAL NEGATIVE REPORT   Report Status PENDING   Incomplete  CULTURE, BLOOD (ROUTINE X 2)     Status: None   Collection Time    11/07/12  1:30 PM      Result Value Range Status    Specimen Description BLOOD LEFT FOOT   Final   Special Requests NONE BOTTLES DRAWN AEROBIC AND ANAEROBIC 5CC   Final   Culture  Setup Time 11/07/2012 19:48   Final   Culture     Final   Value:        BLOOD CULTURE RECEIVED NO GROWTH TO DATE CULTURE WILL BE HELD FOR 5 DAYS BEFORE ISSUING A FINAL NEGATIVE REPORT   Report Status PENDING   Incomplete  URINE CULTURE     Status: None   Collection Time    11/07/12  1:45 PM      Result Value Range Status   Specimen Description URINE, CATHETERIZED   Final   Special Requests NONE   Final   Culture  Setup Time 11/07/2012 21:06   Final   Colony Count NO GROWTH   Final   Culture NO GROWTH   Final   Report Status 11/08/2012 FINAL   Final  MRSA PCR SCREENING     Status: None   Collection Time    11/07/12  4:00 PM      Result Value Range Status   MRSA by PCR NEGATIVE  NEGATIVE Final   Comment:            The GeneXpert MRSA  Assay (FDA     approved for NASAL specimens     only), is one component of a     comprehensive MRSA colonization     surveillance program. It is not     intended to diagnose MRSA     infection nor to guide or     monitor treatment for     MRSA infections.  URINE CULTURE     Status: None   Collection Time    11/07/12  4:06 PM      Result Value Range Status   Specimen Description URINE, CATHETERIZED   Final   Special Requests NONE   Final   Culture  Setup Time 11/07/2012 16:47   Final   Colony Count NO GROWTH   Final   Culture NO GROWTH   Final   Report Status 11/08/2012 FINAL   Final  CLOSTRIDIUM DIFFICILE BY PCR     Status: None   Collection Time    11/07/12  5:25 PM      Result Value Range Status   C difficile by pcr NEGATIVE  NEGATIVE Final  STOOL CULTURE     Status: None   Collection Time    11/07/12  5:26 PM      Result Value Range Status   Specimen Description STOOL   Final   Special Requests NONE   Final   Culture     Final   Value: NO SALMONELLA, SHIGELLA, CAMPYLOBACTER, YERSINIA, OR E.COLI 0157:H7  ISOLATED   Report Status 11/11/2012 FINAL   Final     Studies/Results: No results found.  Medications: Scheduled Meds: . ciprofloxacin  400 mg Intravenous Q12H  . ipratropium  0.5 mg Nebulization Q6H  . levalbuterol  0.63 mg Nebulization Q6H  . magnesium sulfate 1 - 4 g bolus IVPB  4 g Intravenous Once  . metronidazole  500 mg Intravenous Q8H  . pantoprazole (PROTONIX) IV  40 mg Intravenous Q12H  . potassium chloride  10 mEq Intravenous Q1 Hr x 6  . sodium chloride  10-40 mL Intracatheter Q12H   Continuous Infusions: . sodium chloride    . diltiazem (CARDIZEM) infusion 5 mg/hr (11/13/12 0354)  . fat emulsion 240 mL (11/12/12 1821)  . TPN (CLINIMIX) +/- additives 80 mL/hr at 11/12/12 1821  . TPN (CLINIMIX) +/- additives     PRN Meds:.sodium chloride, fentaNYL, levalbuterol, sodium chloride    LOS: 6 days   Neliah Cuyler,CHRISTOPHER  [facility] Pager [phone]. If 8PM-8AM, please contact night-coverage at [email or web address], password [facility] 11/13/2012, 8:57 AM  LOS: 6 days

## 2012-11-13 NOTE — Progress Notes (Signed)
5 Days Post-Op  Subjective: Pt feels great today, wants to go home kiddingly. Pt denies any gas or output from ostomy.  Pt denies N/V.  No abdominal pain.  Pt ambulating with PT/OT.    Objective: Vital signs in last 24 hours: Temp:  [98.4 F (36.9 C)-99.7 F (37.6 C)] 99.7 F (37.6 C) (02/13 0300) Pulse Rate:  [95-142] 103 (02/13 0600) Resp:  [20-22] 20 (02/12 2145) BP: (127-147)/(60-79) 134/66 mmHg (02/13 0600) SpO2:  [89 %-97 %] 97 % (02/13 0500) Weight:  [205 lb 14.6 oz (93.4 kg)] 205 lb 14.6 oz (93.4 kg) (02/13 0415) Last BM Date: 11/10/12  Intake/Output from previous day: 02/12 0701 - 02/13 0700 In: 2120 [I.V.:540; IV Piggyback:400; TPN:1180] Out: 5070 [Urine:5050; Stool:20] Intake/Output this shift:    PE: Gen:  Alert, NAD, pleasant Abd: Soft, NT, +distended, No BS heard, incisions C/D/I outside wound vac, drain with minimal sanguinous drainage; ostomy is purple and edematous with some areas of erythema, on digital exam patent to the end of my pinky with minimal serosanguinous drainage returned after exam   Lab Results:   Recent Labs  11/11/12 0434 11/13/12 0500  WBC 11.0* 9.7  HGB 7.6* 7.8*  HCT 22.7* 23.2*  PLT 223 306   BMET  Recent Labs  11/12/12 0515 11/13/12 0500  NA 143 142  K 3.0* 2.8*  CL 106 103  CO2 31 29  GLUCOSE 147* 155*  BUN 22 21  CREATININE 0.91 0.86  CALCIUM 8.2* 8.1*   PT/INR No results found for this basename: LABPROT, INR,  in the last 72 hours CMP     Component Value Date/Time   NA 142 11/13/2012 0500   K 2.8* 11/13/2012 0500   CL 103 11/13/2012 0500   CO2 29 11/13/2012 0500   GLUCOSE 155* 11/13/2012 0500   BUN 21 11/13/2012 0500   CREATININE 0.86 11/13/2012 0500   CALCIUM 8.1* 11/13/2012 0500   PROT 5.4* 11/13/2012 0500   ALBUMIN 2.3* 11/13/2012 0500   AST 34 11/13/2012 0500   ALT 26 11/13/2012 0500   ALKPHOS 60 11/13/2012 0500   BILITOT 0.2* 11/13/2012 0500   GFRNONAA >90 11/13/2012 0500   GFRAA >90 11/13/2012 0500   Lipase     Component Value Date/Time   LIPASE 45 11/07/2012 1220       Studies/Results: No results found.  Anti-infectives: Anti-infectives   Start     Dose/Rate Route Frequency Ordered Stop   11/10/12 1400  metroNIDAZOLE (FLAGYL) IVPB 500 mg     500 mg 100 mL/hr over 60 Minutes Intravenous Every 8 hours 11/10/12 1036     11/07/12 1800  vancomycin (VANCOCIN) 50 mg/mL oral solution 500 mg  Status:  Discontinued     500 mg Oral 4 times per day 11/07/12 1649 11/08/12 1934   11/07/12 1800  metroNIDAZOLE (FLAGYL) IVPB 500 mg  Status:  Discontinued     500 mg 100 mL/hr over 60 Minutes Intravenous 4 times per day 11/07/12 1649 11/10/12 1036   11/07/12 1700  ciprofloxacin (CIPRO) IVPB 400 mg     400 mg 200 mL/hr over 60 Minutes Intravenous Every 12 hours 11/07/12 1649     11/07/12 1336  ceFEPIme (MAXIPIME) 1 G injection    Comments:  HENSON, VICKIE: cabinet override      11/07/12 1336 11/07/12 1356   11/07/12 1230  vancomycin (VANCOCIN) IVPB 1000 mg/200 mL premix     1,000 mg 200 mL/hr over 60 Minutes Intravenous  Once 11/07/12 1226 11/07/12 1515  11/07/12 1230  ceFEPIme (MAXIPIME) 1 g in dextrose 5 % 50 mL IVPB  Status:  Discontinued     1 g 100 mL/hr over 30 Minutes Intravenous  Once 11/07/12 1226 11/07/12 1736       Assessment/Plan 1. EXPLORATORY LAPAROTOMY with descending and sigmoid colectomy; wound vac placement and colostomy - T. Cornett 11/08/2012  ON Cipro/Flagyl, Still with ileus. Leave NGT for now. Ostomy does not look good, but should function. No output or gas but patent 2. Midline wound VAC changed today, Changed T,Th, Sat  3. DVT prophylaxis - On SQ heparin 4. Hypokalemia - medicine following 5.  ABL Anemia - stable Pt will likely be here until next week.   LOS: 6 days    Aris Georgia 11/13/2012, 9:27 AM Pager: [phone]

## 2012-11-14 LAB — CULTURE, BLOOD (ROUTINE X 2): Culture: NO GROWTH

## 2012-11-14 LAB — CBC
HCT: 25.2 % — ABNORMAL LOW (ref 39.0–52.0)
MCHC: 33.3 g/dL (ref 30.0–36.0)
Platelets: 355 10*3/uL (ref 150–400)
RDW: 14 % (ref 11.5–15.5)

## 2012-11-14 LAB — COMPREHENSIVE METABOLIC PANEL
ALT: 28 U/L (ref 0–53)
Alkaline Phosphatase: 64 U/L (ref 39–117)
CO2: 27 mEq/L (ref 19–32)
GFR calc Af Amer: >90 mL/min (ref >90)
Glucose, Bld: 128 mg/dL — ABNORMAL HIGH (ref 70–99)
Total Bilirubin: 0.3 mg/dL (ref 0.3–1.2)
Total Protein: 6.2 g/dL (ref 6.0–8.3)

## 2012-11-14 LAB — MAGNESIUM: Magnesium: 1.8 mg/dL (ref 1.5–2.5)

## 2012-11-14 LAB — PHOSPHORUS: Phosphorus: 3.4 mg/dL (ref 2.3–4.6)

## 2012-11-14 MED ORDER — POTASSIUM CHLORIDE 10 MEQ/50ML IV SOLN
10.0000 meq | INTRAVENOUS | Status: AC
  Administered 2012-11-14 (×3): 10 meq via INTRAVENOUS
  Filled 2012-11-14 (×3): qty 50

## 2012-11-14 MED ORDER — TRACE MINERALS CR-CU-F-FE-I-MN-MO-SE-ZN IV SOLN
INTRAVENOUS | Status: AC
  Administered 2012-11-14: 18:00:00 via INTRAVENOUS
  Filled 2012-11-14: qty 2000

## 2012-11-14 MED ORDER — LEVALBUTEROL HCL 0.63 MG/3ML IN NEBU
0.6300 mg | INHALATION_SOLUTION | Freq: Three times a day (TID) | RESPIRATORY_TRACT | Status: DC
  Administered 2012-11-14 – 2012-11-17 (×8): 0.63 mg via RESPIRATORY_TRACT
  Filled 2012-11-14 (×11): qty 3

## 2012-11-14 MED ORDER — MAGNESIUM SULFATE 40 MG/ML IJ SOLN
2.0000 g | Freq: Once | INTRAMUSCULAR | Status: AC
  Administered 2012-11-14: 2 g via INTRAVENOUS
  Filled 2012-11-14: qty 50

## 2012-11-14 MED ORDER — FAT EMULSION 20 % IV EMUL
250.0000 mL | INTRAVENOUS | Status: AC
  Administered 2012-11-14: 250 mL via INTRAVENOUS
  Filled 2012-11-14: qty 250

## 2012-11-14 MED ORDER — IPRATROPIUM BROMIDE 0.02 % IN SOLN
0.5000 mg | Freq: Three times a day (TID) | RESPIRATORY_TRACT | Status: DC
  Administered 2012-11-14 – 2012-11-17 (×8): 0.5 mg via RESPIRATORY_TRACT
  Filled 2012-11-14 (×9): qty 2.5

## 2012-11-14 NOTE — Progress Notes (Signed)
dc'ed ng tube pt tolerated well

## 2012-11-14 NOTE — Progress Notes (Signed)
OT Cancellation Note  Treatment cancelled today due to pt fatigue. Pt requesting OT to return later today.  Will re-attempt as time allows this PM otherwise will f/u at later date.  11/14/2012 Cipriano Mile OTR/L Pager [phone] Office [phone]

## 2012-11-14 NOTE — Progress Notes (Signed)
PARENTERAL NUTRITION CONSULT NOTE - F/U  Pharmacy Consult for TPN Indication: ileus s/p bowel resection; prolonged NPO status expected  Allergies  Allergen Reactions  . Amlodipine Besy-Benazepril Hcl     REACTION: sob  . Meloxicam     REACTION: stomach upset  . Penicillins     REACTION: Rash    Patient Measurements: Height: 6\' 3"  (190.5 cm) Weight: 210 lb 12.2 oz (95.6 kg) IBW/kg (Calculated) : 84.5 Adjusted Body Weight: 82.9kg  Vital Signs: Temp: 98.4 F (36.9 C) (02/14 0432) Temp src: Oral (02/13 2100) BP: 144/75 mmHg (02/14 0432) Pulse Rate: 87 (02/14 0432) Intake/Output from previous day: 02/13 0701 - 02/14 0700 In: 2560 [I.V.:120; IV Piggyback:400; TPN:2040] Out: 1500 [Urine:1500] Intake/Output from this shift:    Labs:  Recent Labs  11/13/12 0500 11/14/12 0630  WBC 9.7 13.0*  HGB 7.8* 8.4*  HCT 23.2* 25.2*  PLT 306 355     Recent Labs  11/12/12 0515 11/13/12 0500 11/13/12 1700 11/14/12 0630  NA 143 142 137 136  K 3.0* 2.8* 3.2* 3.6  CL 106 103 102 101  CO2 31 29 30 27   GLUCOSE 147* 155* 130* 128*  BUN 22 21 19 18   CREATININE 0.91 0.86 0.80 0.84  CALCIUM 8.2* 8.1* 7.8* 8.5  MG 1.9 1.6  --  1.8  PHOS 3.4 4.2  --  3.4  PROT  --  5.4*  --  6.2  ALBUMIN  --  2.3*  --  2.6*  AST  --  34  --  29  ALT  --  26  --  28  ALKPHOS  --  60  --  64  BILITOT  --  0.2*  --  0.3   Estimated Creatinine Clearance: 107.6 ml/min (by C-G formula based on Cr of 0.84).    Recent Labs  11/11/12 2018 11/12/12 0004 11/12/12 0405  GLUCAP 142* 141* 135*    Insulin Requirements in the past 24 hours:  None  Current Nutrition:  NPO  Assessment: 62 y/o male patient admitted 2/7 with abdominal pain and n/v/d, found to have ischemic small bowel needing resection. Now POD #6 s/p sigmoid colectomy and colostomy requiring TPN for anticipated prolonged NPO status/ileus.   Nutritional Goals:  2300-2400 kCal, 125-140 grams of protein per day  Goal  175ml/hr=weekly average 2317 kcal and 120g protein daily  GI: POD #6 s/p ex-lap with bowel resection. NPO. NG tube without any output the last 24h.  Ostomy with very little output but is viable and should function.  Endo: no h/o DM, SSI d/c due to good glycemic control on goal TPN  Lytes: K is improved today but remains low. Mg is low normal. Other lytes are WNL. Appears as though patient is still refeeding to a degree, it would be prudent to have normalized K (~4), before advancing or cycling TPN.  Renal: Scr stable, UOP remains adequate. Acid-base status is ok  Pulm: 3L [place]  Cards: VSS - continues on diltiazem drip at 5mg /hr (on PO diltiazem PTA).  Hepatobil: LFT, tbili wnl, Triglycerides 185 at baseline. Prealbumin is low, which is not alarming post-op and with ongoing inflammation. Would expect slow trend up.  ID:   enteritis, Cipro/Flagyl D#7, cx negative, cdiff neg. Afeb, WBC 13.  Best Practices: SCDs. IV PPI (was on PO PPI PTA).   TPN Access: PICC placed 2/10  TPN day#: 5  IVF: NS at [facility]  Plan:  - Continue Clinimix E 5/20 at 46ml/hr, will hold off on advancing  until serum K has normalized - Will give 2 gm IV Mg and 3 runs of KCL today - Will supplement MVI, trace elements and IV lipids on MWF due to ongoing Publishing copy. - Will f/up BMET, Mg and Phos in AM  Estella Husk, Pharm.D., BCPS Clinical Pharmacist Phone: [phone] or [phone] Pager: [phone] 11/14/2012, 7:58 AM

## 2012-11-14 NOTE — Accreditation Note (Signed)
Clamped ng tube.

## 2012-11-14 NOTE — Progress Notes (Signed)
K KIRBY P.A. AWARE THAT PATIENT ISLEAKING URINE AROUND CATER AND IS NOT GOING IN Bag. Large amts. No odor appears to be clear.. R.N. AWARE K. KIRBY P.A. WILL ADDRESS W/ M.D. IN A.M.

## 2012-11-14 NOTE — Progress Notes (Signed)
Physical Therapy Treatment Patient Details Name: James [name] MRN: [id number] DOB: 05-02-1951 Today's Date: 11/14/2012 Time: 1478-2956 PT Time Calculation (min): 24 min  PT Assessment / Plan / Recommendation Comments on Treatment Session  Pt with colostomy and wound vac with decr mobility secondary to decr endurance and decr balance.  Has improved with mobility with slight decr in safety due to decr balance.      Follow Up Recommendations  Home health PT                 Equipment Recommendations  Rolling walker with 5" wheels        Frequency Min 3X/week   Plan Discharge plan remains appropriate;Frequency remains appropriate    Precautions / Restrictions Precautions Precautions: Fall Precaution Comments: Abdominal wound vac, NG tube Restrictions Weight Bearing Restrictions: No   Pertinent Vitals/Pain VSS, No pain    Mobility  Bed Mobility Bed Mobility: Rolling Right;Right Sidelying to Sit;Sitting - Scoot to [place] of Bed Rolling Right: 4: Min assist Right Sidelying to Sit: 4: Min assist Sitting - Scoot to Edge of Bed: 4: Min assist Sit to Supine: Not Tested (comment) Details for Bed Mobility Assistance: cues for technique and sequencing of movement Transfers Transfers: Sit to Stand;Stand to Sit Sit to Stand: With upper extremity assist;From bed;4: Min guard Stand to Sit: 4: Min guard;With upper extremity assist;With armrests;To chair/3-in-1 Details for Transfer Assistance: pt needed min guard assist for safety with sit to stand as well as cues for hand placement.  Stedying assist needed initially upon standing.   Ambulation/Gait Ambulation/Gait Assistance: 4: Min guard Ambulation Distance (Feet): 350 Feet Assistive device: Rolling walker Ambulation/Gait Assistance Details: Variable pace as pt ambulating really quickly at times and forward flexed.  Occasional cues to stay close to RW as well.   Gait Pattern: Step-through pattern;Decreased stride length;Wide base of  support Gait velocity: [facility] Wheelchair Mobility Wheelchair Mobility: No    Exercises General Exercises - Lower Extremity Long Arc Quad: AROM;Both;Seated;10 reps Hip Flexion/Marching: AROM;Both;10 reps;Seated Toe Raises: AROM;Both;10 reps;Seated   PT Goals Acute Rehab PT Goals PT Goal: Supine/Side to Sit - Progress: Progressing toward goal PT Goal: Sit to Supine/Side - Progress: Progressing toward goal PT Goal: Sit to Stand - Progress: Progressing toward goal PT Goal: Stand to Sit - Progress: Progressing toward goal PT Goal: Ambulate - Progress: Progressing toward goal  Visit Information  Last PT Received On: 11/14/12 Assistance Needed: +2 (+2 for lines)    Subjective Data  Subjective: "I am feeling better."   Cognition  Cognition Overall Cognitive Status: Appears within functional limits for tasks assessed/performed Arousal/Alertness: Awake/alert Orientation Level: Appears intact for tasks assessed Behavior During Session: [facility] for tasks performed    Balance  Static Standing Balance Static Standing - Balance Support: Bilateral upper extremity supported;During functional activity Static Standing - Level of Assistance: 5: Stand by assistance  End of Session PT - End of Session Equipment Utilized During Treatment: Gait belt Activity Tolerance: Patient tolerated treatment well Patient left: in chair;with call bell/phone within reach Nurse Communication: Mobility status       INGOLD,Laurencia Roma 11/14/2012, 3:30 PM [facility] [phone] (pager)

## 2012-11-14 NOTE — Plan of Care (Signed)
Problem: Phase II Progression Outcomes Goal: Return of bowel function (flatus, BM) IF ABDOMINAL SURGERY:  Outcome: Not Met (add Reason) No bowel sounds yet has colostomy very little drainage from it

## 2012-11-14 NOTE — Progress Notes (Signed)
6 Days Post-Op   Assessment: s/p Procedure(s): EXPLORATORY LAPAROTOMY with descending and sigmoid colectomy; wound vac placement and colostomy Patient Active Problem List  Diagnosis  . HYPERLIPIDEMIA  . HYPERTENSION  . PERIPHERAL VASCULAR DISEASE  . COPD  . GERD  . BENIGN PROSTATIC HYPERTROPHY  . HERNIATED LUMBAR DISC  . LOW BACK PAIN  . LUNG CANCER, HX OF  . DIVERTICULITIS, HX OF  . Hypovolemic shock  . Abdominal pain  . Emesis  . Diarrhea  . Swollen upper lip  . Paroxysmal a-fib, spont conversion to NSR  . History of hypertension  . COPD (chronic obstructive pulmonary disease)  . Hyperlipidemia  . Hyperglycemia, stress related  . Elevated alkaline phosphatase level  . SIRS criteria on admission (leukocytosis, hypotension, hypothermia)  . Enteritis  . Ischemic colitis     Improving, bowel function seems to be returning  Plan: Will clapm N/G and if tolerated take out later today Will recheck wbc tomorrow as has crept up to 13K  Subjective: Feels OK no c/o  Objective: Vital signs in last 24 hours: Temp:  [98.4 F (36.9 C)-99.6 F (37.6 C)] 98.4 F (36.9 C) (02/14 0432) Pulse Rate:  [81-87] 87 (02/14 0432) Resp:  [16-21] 16 (02/14 0432) BP: (143-148)/(70-75) 144/75 mmHg (02/14 0432) SpO2:  [89 %-98 %] 94 % (02/14 0810) Weight:  [210 lb 12.2 oz (95.6 kg)] 210 lb 12.2 oz (95.6 kg) (02/14 0432)   Intake/Output from previous day: 02/13 0701 - 02/14 0700 In: 2560 [I.V.:120; IV Piggyback:400; TPN:2040] Out: 1500 [Urine:1500] Intake/Output this shift:     General appearance: alert, cooperative and no distress Resp: clear to auscultation bilaterally Cardio: regular rate and rhythm, S1, S2 normal, no murmur, click, rub or gallop GI: Still a litlle distended, soft, non tender, few BS, osotmy ischmic pathches but viable I think  Incision: Vac in plaace  Lab Results:   Recent Labs  11/13/12 0500 11/14/12 0630  WBC 9.7 13.0*  HGB 7.8* 8.4*  HCT 23.2*  25.2*  PLT 306 355   BMET  Recent Labs  11/13/12 1700 11/14/12 0630  NA 137 136  K 3.2* 3.6  CL 102 101  CO2 30 27  GLUCOSE 130* 128*  BUN 19 18  CREATININE 0.80 0.84  CALCIUM 7.8* 8.5   PT/INR No results found for this basename: LABPROT, INR,  in the last 72 hours ABG No results found for this basename: PHART, PCO2, PO2, HCO3,  in the last 72 hours  MEDS, Scheduled . ciprofloxacin  400 mg Intravenous Q12H  . ipratropium  0.5 mg Nebulization QID  . levalbuterol  0.63 mg Nebulization QID  . magnesium sulfate 1 - 4 g bolus IVPB  2 g Intravenous Once  . metronidazole  500 mg Intravenous Q8H  . pantoprazole (PROTONIX) IV  40 mg Intravenous Q12H  . potassium chloride  10 mEq Intravenous Q1 Hr x 3  . sodium chloride  10-40 mL Intracatheter Q12H    Studies/Results: No results found.    LOS: 7 days     Currie Paris, MD, [facility], [place] [phone]   11/14/2012 8:58 AM

## 2012-11-14 NOTE — Progress Notes (Signed)
[facility] PROGRESS NOTE  Nancy Manuele ZOX:[id number] DOB: [birth year] DOA: 11/07/2012 PCP: Sanda Linger, MD  Assessment/Plan: Principal Problem:   Hypovolemic shock Active Problems:   Abdominal pain   Emesis   Diarrhea   Swollen upper lip   Paroxysmal a-fib, spont conversion to NSR   History of hypertension   COPD (chronic obstructive pulmonary disease)   Hyperlipidemia   Hyperglycemia, stress related   Elevated alkaline phosphatase level   SIRS criteria on admission (leukocytosis, hypotension, hypothermia)   Enteritis   Ischemic colitis    1. Ischemic colitis: Patient presented with abdominal pain, nausea and vomiting. Imaging studies were unhelpful, and patient did have a known history of vascular disease, raising concern for bowel ischemia. Dr Luisa Hart provided surgical consultation, and patient underwent emergent exploratory laparotomy with descending and sigmoid colectomy; wound vac placement and colostomy on 11/08/12. Now POD# 6, managing per CCS. On TPN. Patient had vomiting with associated abdominal pain, concerning for ostomy ischemia, as well as post-operative ileus. NG-T in situ. On Ciprofloxacin/Flagyl, day#8. Abdominal pain has resolved, and NG-T drainage is now minimal. Clamped today per surgical team. 2. Hypovolemic Shock: This was present at presentation and peri-opreratively, in setting of ischemic colitis; resolved.  3. COPD : This appears stable. Patient is on Symbicort at baseline. Managing with scheduled bronchodilators. Utilizing Xopenex due to tachycardia, bronchial hygiene and oxygen supplementation. Keeping SpO2 > 92% 4. Swollen upper lip: This was an incidental finding, involving eyelid and lip edema, no tongue involvement, unclear etiology. Now resolved.  5. Atrial Fibrillation: PAF. Episode noted in [facility], and again 11/09/12.  Intermittent in ICU. TSH WNL. ECHO with grade 1 diastolic dysfunction, septal flattening suggestive of possible RV volume &  pressure overload, RV mildly dilated. No Heparin drip as post-op and concerns for ischemia to ostomy  6. Electrolyte abnormalities: Hyponatremia, Hypochloremia, now resolved. Hypokalemia, hypomagnesemia. Following electrolytes and repleting as indicated.  7. Reflux/GERD:  On Protonix 40 bid.  8. Anemia of critical illness: HB ranging between 7.6-8.2. Following CBC. Transfuse for Hb < 7.  9. Delirium: Likely due to opioids/PCA. Managed with reduction of opioids, behavioral modifiers, chair, mobilize. PT consult. Resolved. Mental status now at baseline.  10. Urinary retention: Patient had dirbbling incontinence/leakage around Foley catheter from 11/12/12. Flushed today, with evacuation of >1000 ml urine. Likely, this was due to a blocked Foley catheter.    Code Status: Full Code.  Family Communication:  Disposition Plan: To be determined.    Brief narrative: 62 y/o WM with PMH of COPD, Lung cancer s/p resection at [facility] who presented to [facility] with acute onset abdominal pain with associated n/v/d. Admitted to [facility] ICU by Dr Billy Fischer, for further evaluation,on suspicion of possible ishemia of colon or small bowel. Surgical consultation was provided by Dr Luisa Hart, and patient is s/p emergent exploratory laparotomy 11/08/2012, with descending and sigmoid colectomy; wound vac placement and colostomy due to increasing abdominal pain with negative radiographic work up, as well as a history of vascular disease warranting concern for ischemic colitis. Clinical course was complicated by hypovolemic shock, electrolyte abnormalities and PAF. Patient was transferred to [facility] service, effective 11/13/12.     Consultants:  PCCM; Billy Fischer.   Dr Luisa Hart, Careers adviser.   Procedures:  Serial CXRs.  Abdominal X-Rays.  Abdominal/Pelvic CTA.    CTA chest.   Antibiotics:  Ciprofloxacin 11/07/12>>>  Flagyl 11/07/12>>>  HPI/Subjective: No new issues.   Objective: Vital signs in last 24  hours: Temp:  [98.4  F (36.9 C)-99.6 F (37.6 C)] 98.4 F (36.9 C) (02/14 0432) Pulse Rate:  [81-87] 87 (02/14 0432) Resp:  [16-21] 16 (02/14 0432) BP: (143-148)/(70-75) 144/75 mmHg (02/14 0432) SpO2:  [89 %-98 %] 94 % (02/14 0810) Weight:  [95.6 kg (210 lb 12.2 oz)] 95.6 kg (210 lb 12.2 oz) (02/14 0432) Weight change: 2.2 kg (4 lb 13.6 oz) Last BM Date: 11/10/12  Intake/Output from previous day: 02/13 0701 - 02/14 0700 In: 2560 [I.V.:120; IV Piggyback:400; TPN:2040] Out: 1500 [Urine:1500] Total I/O In: -  Out: 1100 [Urine:1100]   Physical Exam: General: Comfortable, alert, communicative, fully oriented, not short of breath at rest. Has NG-T is clamped.  HEENT:  Moderate clinical pallor, no jaundice, no conjunctival injection or discharge. NECK:  Supple, JVP not seen, no carotid bruits, no palpable lymphadenopathy, no palpable goiter. CHEST:  Clinically clear to auscultation, no wheezes, no crackles. HEART:  Sounds 1 and 2 heard, normal, regular, no murmurs. ABDOMEN:  Full, soft, healing mid-line surgical wound, non-tender, no palpable organomegaly, no palpable masses. Bowel sounds heard, albeit sluggish. GENITALIA:  Not examined. LOWER EXTREMITIES:  No pitting edema, palpable peripheral pulses. MUSCULOSKELETAL SYSTEM:  Generalized osteoarthritic changes, otherwise, normal. CENTRAL NERVOUS SYSTEM:  No focal neurologic deficit on gross examination.  Lab Results:  Recent Labs  11/13/12 0500 11/14/12 0630  WBC 9.7 13.0*  HGB 7.8* 8.4*  HCT 23.2* 25.2*  PLT 306 355    Recent Labs  11/13/12 1700 11/14/12 0630  NA 137 136  K 3.2* 3.6  CL 102 101  CO2 30 27  GLUCOSE 130* 128*  BUN 19 18  CREATININE 0.80 0.84  CALCIUM 7.8* 8.5   Recent Results (from the past 240 hour(s))  CULTURE, BLOOD (ROUTINE X 2)     Status: None   Collection Time    11/07/12 12:20 PM      Result Value Range Status   Specimen Description Blood   Final   Special Requests NONE   Final    Culture  Setup Time 11/07/2012 18:01   Final   Culture NO GROWTH 5 DAYS   Final   Report Status 11/14/2012 FINAL   Final  CULTURE, BLOOD (ROUTINE X 2)     Status: None   Collection Time    11/07/12  1:30 PM      Result Value Range Status   Specimen Description BLOOD LEFT FOOT   Final   Special Requests NONE BOTTLES DRAWN AEROBIC AND ANAEROBIC 5CC   Final   Culture  Setup Time 11/07/2012 19:48   Final   Culture NO GROWTH 5 DAYS   Final   Report Status 11/14/2012 FINAL   Final  URINE CULTURE     Status: None   Collection Time    11/07/12  1:45 PM      Result Value Range Status   Specimen Description URINE, CATHETERIZED   Final   Special Requests NONE   Final   Culture  Setup Time 11/07/2012 21:06   Final   Colony Count NO GROWTH   Final   Culture NO GROWTH   Final   Report Status 11/08/2012 FINAL   Final  MRSA PCR SCREENING     Status: None   Collection Time    11/07/12  4:00 PM      Result Value Range Status   MRSA by PCR NEGATIVE  NEGATIVE Final   Comment:            The GeneXpert MRSA Assay (FDA  approved for NASAL specimens     only), is one component of a     comprehensive MRSA colonization     surveillance program. It is not     intended to diagnose MRSA     infection nor to guide or     monitor treatment for     MRSA infections.  URINE CULTURE     Status: None   Collection Time    11/07/12  4:06 PM      Result Value Range Status   Specimen Description URINE, CATHETERIZED   Final   Special Requests NONE   Final   Culture  Setup Time 11/07/2012 16:47   Final   Colony Count NO GROWTH   Final   Culture NO GROWTH   Final   Report Status 11/08/2012 FINAL   Final  CLOSTRIDIUM DIFFICILE BY PCR     Status: None   Collection Time    11/07/12  5:25 PM      Result Value Range Status   C difficile by pcr NEGATIVE  NEGATIVE Final  STOOL CULTURE     Status: None   Collection Time    11/07/12  5:26 PM      Result Value Range Status   Specimen Description STOOL   Final    Special Requests NONE   Final   Culture     Final   Value: NO SALMONELLA, SHIGELLA, CAMPYLOBACTER, YERSINIA, OR E.COLI 0157:H7 ISOLATED   Report Status 11/11/2012 FINAL   Final     Studies/Results: No results found.  Medications: Scheduled Meds: . ciprofloxacin  400 mg Intravenous Q12H  . ipratropium  0.5 mg Nebulization QID  . levalbuterol  0.63 mg Nebulization QID  . magnesium sulfate 1 - 4 g bolus IVPB  2 g Intravenous Once  . metronidazole  500 mg Intravenous Q8H  . pantoprazole (PROTONIX) IV  40 mg Intravenous Q12H  . sodium chloride  10-40 mL Intracatheter Q12H   Continuous Infusions: . sodium chloride    . diltiazem (CARDIZEM) infusion 5 mg/hr (11/14/12 0600)  . TPN (CLINIMIX) +/- additives     And  . fat emulsion    . TPN (CLINIMIX) +/- additives 80 mL/hr at 11/13/12 1709   PRN Meds:.sodium chloride, fentaNYL, levalbuterol, sodium chloride    LOS: 7 days   Earland Reish,CHRISTOPHER  [facility] Pager [phone]. If 8PM-8AM, please contact night-coverage at [email or web address], password [facility] 11/14/2012, 1:07 PM  LOS: 7 days

## 2012-11-15 LAB — COMPREHENSIVE METABOLIC PANEL
Albumin: 2.5 g/dL — ABNORMAL LOW (ref 3.5–5.2)
BUN: 16 mg/dL (ref 6–23)
Creatinine, Ser: 0.84 mg/dL (ref 0.50–1.35)
Potassium: 3.6 mEq/L (ref 3.5–5.1)
Total Protein: 6 g/dL (ref 6.0–8.3)

## 2012-11-15 LAB — CBC
HCT: 25.3 % — ABNORMAL LOW (ref 39.0–52.0)
MCHC: 33.2 g/dL (ref 30.0–36.0)
MCV: 87.8 fL (ref 78.0–100.0)
Platelets: 384 10*3/uL (ref 150–400)
RDW: 13.9 % (ref 11.5–15.5)

## 2012-11-15 LAB — PHOSPHORUS: Phosphorus: 3.6 mg/dL (ref 2.3–4.6)

## 2012-11-15 MED ORDER — HEPARIN SODIUM (PORCINE) 5000 UNIT/ML IJ SOLN
5000.0000 [IU] | Freq: Three times a day (TID) | INTRAMUSCULAR | Status: DC
  Administered 2012-11-15 – 2012-11-17 (×7): 5000 [IU] via SUBCUTANEOUS
  Filled 2012-11-15 (×10): qty 1

## 2012-11-15 MED ORDER — MAGNESIUM SULFATE 40 MG/ML IJ SOLN
2.0000 g | Freq: Once | INTRAMUSCULAR | Status: AC
  Administered 2012-11-15: 2 g via INTRAVENOUS
  Filled 2012-11-15: qty 50

## 2012-11-15 MED ORDER — POTASSIUM CHLORIDE 10 MEQ/50ML IV SOLN
10.0000 meq | INTRAVENOUS | Status: AC
  Administered 2012-11-15 (×3): 10 meq via INTRAVENOUS
  Filled 2012-11-15 (×3): qty 50

## 2012-11-15 MED ORDER — CLINIMIX E/DEXTROSE (5/20) 5 % IV SOLN
INTRAVENOUS | Status: DC
  Administered 2012-11-15: 18:00:00 via INTRAVENOUS
  Filled 2012-11-15: qty 2000

## 2012-11-15 NOTE — Progress Notes (Signed)
PATIENT VOIDED LARGE AMT. OF CLR URINE IN BED AROUND CATHETER. THEN I IRRIGATE FOLEY W/ STERILE WATER AND WENT IN WITH NO PROBLEM . THE FOLEY DRAINED 1050 ML DK TEA COLOR WITH WHAT APPEARS TO BE SOME BLOOD IN IT.  THIS IS THE 2ND TIME TONIGHT THIS HAS HAPPENED WITH URINE COMING OUT AROUND CATHETER W/ LARGE AMT. OF URINE.

## 2012-11-15 NOTE — Progress Notes (Signed)
PARENTERAL NUTRITION CONSULT NOTE - F/U  Pharmacy Consult for TPN Indication: ileus s/p bowel resection; prolonged NPO status expected  Allergies  Allergen Reactions  . Amlodipine Besy-Benazepril Hcl     REACTION: sob  . Meloxicam     REACTION: stomach upset  . Penicillins     REACTION: Rash    Patient Measurements: Height: 6\' 3"  (190.5 cm) Weight: 203 lb 11.2 oz (92.398 kg) IBW/kg (Calculated) : 84.5 Adjusted Body Weight: 82.9kg  Vital Signs: Temp: 99 F (37.2 C) (02/15 0530) Temp src: Oral (02/15 0530) BP: 142/74 mmHg (02/15 0530) Pulse Rate: 109 (02/15 0530) Intake/Output from previous day: 02/14 0701 - 02/15 0700 In: 1540 [I.V.:60; IV Piggyback:400; TPN:1080] Out: 4170 [Urine:4150; Stool:20] Intake/Output from this shift:    Labs:  Recent Labs  11/13/12 0500 11/14/12 0630 11/15/12 0445  WBC 9.7 13.0* 13.8*  HGB 7.8* 8.4* 8.4*  HCT 23.2* 25.2* 25.3*  PLT 306 355 384     Recent Labs  11/13/12 0500 11/13/12 1700 11/14/12 0630 11/15/12 0445  NA 142 137 136 136  K 2.8* 3.2* 3.6 3.6  CL 103 102 101 100  CO2 29 30 27 26   GLUCOSE 155* 130* 128* 145*  BUN 21 19 18 16   CREATININE 0.86 0.80 0.84 0.84  CALCIUM 8.1* 7.8* 8.5 8.3*  MG 1.6  --  1.8 1.7  PHOS 4.2  --  3.4 3.6  PROT 5.4*  --  6.2 6.0  ALBUMIN 2.3*  --  2.6* 2.5*  AST 34  --  29 21  ALT 26  --  28 24  ALKPHOS 60  --  64 61  BILITOT 0.2*  --  0.3 0.2*   Estimated Creatinine Clearance: 107.6 ml/min (by C-G formula based on Cr of 0.84).   No results found for this basename: GLUCAP,  in the last 72 hours  Insulin Requirements in the past 24 hours:  None  Current Nutrition:  NPO  Assessment: 62 y/o male patient admitted 2/7 with abdominal pain and n/v/d, found to have ischemic small bowel needing resection. Now POD #7 s/p sigmoid colectomy and colostomy requiring TPN for anticipated prolonged NPO status/ileus.   Nutritional Goals:  2300-2400 kCal, 125-140 grams of protein per day   Goal 119ml/hr=weekly average 2317 kcal and 120g protein daily  GI: POD #7 s/p ex-lap with bowel resection. NPO. NG tube removed 2/14.  Ostomy with very little output but is viable and should function.  Endo: no h/o DM, SSI d/c due to good glycemic control on goal TPN  Lytes: K is stable today but remains low. Mg is low normal. Other lytes are WNL. Appears as though patient is still refeeding to a degree, it would be prudent to have normalized K (~4), before advancing or cycling TPN.  Renal: Scr stable, UOP remains adequate. Acid-base status is ok  Pulm: off O2  Cards: VSS - continues on diltiazem drip at 5mg /hr (on PO diltiazem PTA).  Hepatobil: LFT, tbili wnl, Triglycerides 185 at baseline. Prealbumin is low, which is not alarming post-op and with ongoing inflammation. Would expect slow trend up.  ID:   enteritis, Cipro/Flagyl D#8, cx negative, cdiff neg. Afeb, WBC 13.8.  Best Practices: SCDs. IV PPI (was on PO PPI PTA).   TPN Access: PICC placed 2/10  TPN day#: 6  IVF: NS at [facility]  Plan:  - Continue Clinimix E 5/20 at 71ml/hr, will hold off on advancing until serum K has normalized - Will give 2 gm IV Mg and  3 runs of KCL today - Will supplement MVI, trace elements and IV lipids on MWF due to ongoing national shortages. - Will f/up BMET, Mg, and Phos in AM  Estella Husk, Pharm.D., BCPS Clinical Pharmacist Phone: [phone] or [phone] Pager: [phone] 11/15/2012, 7:48 AM

## 2012-11-15 NOTE — Progress Notes (Signed)
General Surgery Note  LOS: 8 days  POD -   7 Days Post-Op Room - 2012  Assessment/Plan: 1.  EXPLORATORY LAPAROTOMY with descending and sigmoid colectomy for ischemic colitis, VAC placement and colostomy - T. Cornett - 11/08/2012  ON Cipro/Flagyl  WBC - 13,800 - 11/15/2012  Some flatus in ostomy bag, but not much output.   Overall looks good.  Will start clear liquids.   2.  Midline wound VAC  Wound looks good.  Will stop VAC and start saline damp to dry dressings. 3.  Foley  Will remove and see how he does.  There has been some trouble of leakage around the foley anyway. 4. DVT prophylaxis  Somehow this got dropped.  Will restart SQ heparin 5.  Anemia - Hgb - 8.4 - 11/15/2012  Subjective:  Comfortable.  Wants something to eat. Objective:   Filed Vitals:   11/15/12 0530  BP: 142/74  Pulse: 109  Temp: 99 F (37.2 C)  Resp: 20     Intake/Output from previous day:  02/14 0701 - 02/15 0700 In: 1540 [I.V.:60; IV Piggyback:400; TPN:1080] Out: 4170 [Urine:4150; Stool:20]  Intake/Output this shift:      Physical Exam:   General: WN older WM who is alert and oriented.    HEENT: Normal. Pupils equal. .   Lungs: Clear   Abdomen: Softer. BS present.  Left abdominal colostomy brown/gray   Uro:  Has foley.  Will try this out again.   Neurologic:  Grossly intact to motor and sensory function.     Lab Results:     Recent Labs  11/14/12 0630 11/15/12 0445  WBC 13.0* 13.8*  HGB 8.4* 8.4*  HCT 25.2* 25.3*  PLT 355 384    BMET    Recent Labs  11/14/12 0630 11/15/12 0445  NA 136 136  K 3.6 3.6  CL 101 100  CO2 27 26  GLUCOSE 128* 145*  BUN 18 16  CREATININE 0.84 0.84  CALCIUM 8.5 8.3*    PT/INR  No results found for this basename: LABPROT, INR,  in the last 72 hours  ABG  No results found for this basename: PHART, PCO2, PO2, HCO3,  in the last 72 hours   Studies/Results:  No results found.   Anti-infectives:   Anti-infectives   Start     Dose/Rate Route  Frequency Ordered Stop   11/10/12 1400  metroNIDAZOLE (FLAGYL) IVPB 500 mg     500 mg 100 mL/hr over 60 Minutes Intravenous Every 8 hours 11/10/12 1036     11/07/12 1800  vancomycin (VANCOCIN) 50 mg/mL oral solution 500 mg  Status:  Discontinued     500 mg Oral 4 times per day 11/07/12 1649 11/08/12 1934   11/07/12 1800  metroNIDAZOLE (FLAGYL) IVPB 500 mg  Status:  Discontinued     500 mg 100 mL/hr over 60 Minutes Intravenous 4 times per day 11/07/12 1649 11/10/12 1036   11/07/12 1700  ciprofloxacin (CIPRO) IVPB 400 mg     400 mg 200 mL/hr over 60 Minutes Intravenous Every 12 hours 11/07/12 1649     11/07/12 1336  ceFEPIme (MAXIPIME) 1 G injection    Comments:  HENSON, VICKIE: cabinet override      11/07/12 1336 11/07/12 1356   11/07/12 1230  vancomycin (VANCOCIN) IVPB 1000 mg/200 mL premix     1,000 mg 200 mL/hr over 60 Minutes Intravenous  Once 11/07/12 1226 11/07/12 1515   11/07/12 1230  ceFEPIme (MAXIPIME) 1 g in dextrose 5 %  50 mL IVPB  Status:  Discontinued     1 g 100 mL/hr over 30 Minutes Intravenous  Once 11/07/12 1226 11/07/12 1736      Ovidio Kin, MD, FACS Pager: [phone],   [facility] Office: [phone] 11/15/2012

## 2012-11-15 NOTE — Progress Notes (Signed)
[facility] PROGRESS NOTE  James [name] YQM:[id number] DOB: 16-Apr-1951 DOA: 11/07/2012 PCP: Sanda Linger, MD  Assessment/Plan: Principal Problem:   Hypovolemic shock Active Problems:   Abdominal pain   Emesis   Diarrhea   Swollen upper lip   Paroxysmal a-fib, spont conversion to NSR   History of hypertension   COPD (chronic obstructive pulmonary disease)   Hyperlipidemia   Hyperglycemia, stress related   Elevated alkaline phosphatase level   SIRS criteria on admission (leukocytosis, hypotension, hypothermia)   Enteritis   Ischemic colitis    1. Ischemic colitis: Patient presented with abdominal pain, nausea and vomiting. Imaging studies were unhelpful, and patient did have a known history of vascular disease, raising concern for bowel ischemia. Dr Luisa Hart provided surgical consultation, and patient underwent emergent exploratory laparotomy with descending and sigmoid colectomy; wound vac placement and colostomy on 11/08/12. Now POD# 7, managing per CCS. On TPN. Patient had vomiting with associated abdominal pain, concerning for ostomy ischemia, as well as post-operative ileus, but this appears to have resolved. On Ciprofloxacin/Flagyl, day #9. NG-T drainage was clamped on 11/14/12 per surgical team, and then discontinued. Patient is tolerating clears today. 2. Hypovolemic Shock: This was present at presentation and peri-opreratively, in setting of ischemic colitis; resolved.  3. COPD : This appears stable. Patient is on Symbicort at baseline. Managed with scheduled bronchodilators (Utilizing Xopenex due to tachycardia), bronchial hygiene and oxygen supplementation, with satisfactory response. Weaned successfully off oxygen, and today, is saturating at 92%-95% on RA.  4. Swollen upper lip: This was an incidental finding, involving eyelid and lip edema, no tongue involvement, unclear etiology. Now resolved.  5. Atrial Fibrillation: PAF. Episode noted in [facility], and again 11/09/12.   Intermittent in ICU. TSH WNL. Echocardiogram showed grade 1 diastolic dysfunction, septal flattening suggestive of possible RV volume & pressure overload, RV mildly dilated. No Heparin drip as post-op and concerns for ischemia to ostomy. Now in SR.   6. Electrolyte abnormalities: Hyponatremia, Hypochloremia, now resolved. Hypokalemia, hypomagnesemia. Following electrolytes and repleting as indicated.  7. Reflux/GERD:  On Protonix 40 bid.  8. Anemia of critical illness: HB ranging between 7.6-8.2. Following CBC. Stable. Transfuse for Hb < 7.  9. Delirium: Likely due to opioids/PCA. Managed with reduction of opioids, behavioral modifiers, chair, mobilize. PT consult. Resolved. Mental status now at baseline.  10. Urinary retention: Patient had dirbbling incontinence/leakage around Foley catheter from 11/12/12. Flushed today, with evacuation of >1000 ml urine. Likely, this was due to a blocked Foley catheter. Foley catheter was discontinued in AM 11/15/12, without resultant retention.    Code Status: Full Code.  Family Communication:  Disposition Plan: To be determined.    Brief narrative: 62 y/o WM with PMH of COPD, Lung cancer s/p resection at [facility] who presented to [facility] with acute onset abdominal pain with associated n/v/d. Admitted to [facility] ICU by Dr Billy Fischer, for further evaluation,on suspicion of possible ishemia of colon or small bowel. Surgical consultation was provided by Dr Luisa Hart, and patient is s/p emergent exploratory laparotomy 11/08/2012, with descending and sigmoid colectomy; wound vac placement and colostomy due to increasing abdominal pain with negative radiographic work up, as well as a history of vascular disease warranting concern for ischemic colitis. Clinical course was complicated by hypovolemic shock, electrolyte abnormalities and PAF. Patient was transferred to [facility] service, effective 11/13/12.     Consultants:  PCCM; Billy Fischer.   Dr Luisa Hart, Careers adviser.    Procedures:  Serial CXRs.  Abdominal X-Rays.  Abdominal/Pelvic CTA.    CTA chest.   Antibiotics:  Ciprofloxacin 11/07/12>>>  Flagyl 11/07/12>>>  HPI/Subjective: Feels much better. Tolerating clears.   Objective: Vital signs in last 24 hours: Temp:  [98.6 F (37 C)-100 F (37.8 C)] 98.6 F (37 C) (02/15 1446) Pulse Rate:  [91-109] 91 (02/15 1446) Resp:  [16-20] 16 (02/15 1446) BP: (142-161)/(73-74) 161/73 mmHg (02/15 1446) SpO2:  [91 %-95 %] 95 % (02/15 1620) Weight:  [92.398 kg (203 lb 11.2 oz)] 92.398 kg (203 lb 11.2 oz) (02/15 0530) Weight change: -3.202 kg (-7 lb 1 oz) Last BM Date: 11/15/12  Intake/Output from previous day: 02/14 0701 - 02/15 0700 In: 1540 [I.V.:60; IV Piggyback:400; TPN:1080] Out: 4170 [Urine:4150; Stool:20] Total I/O In: 420 [P.O.:420] Out: 1175 [Urine:1175]   Physical Exam: General: Comfortable, alert, communicative, fully oriented, not short of breath at rest.  HEENT:  Moderate clinical pallor, no jaundice, no conjunctival injection or discharge. NECK:  Supple, JVP not seen, no carotid bruits, no palpable lymphadenopathy, no palpable goiter. CHEST:  Clinically clear to auscultation, no wheezes, no crackles. HEART:  Sounds 1 and 2 heard, normal, regular, no murmurs. ABDOMEN:  Full, soft, healing mid-line surgical wound, non-tender, no palpable organomegaly, no palpable masses. Bowel sounds heard. GENITALIA:  Not examined. LOWER EXTREMITIES:  No pitting edema, palpable peripheral pulses. MUSCULOSKELETAL SYSTEM:  Generalized osteoarthritic changes, otherwise, normal. CENTRAL NERVOUS SYSTEM:  No focal neurologic deficit on gross examination.  Lab Results:  Recent Labs  11/14/12 0630 11/15/12 0445  WBC 13.0* 13.8*  HGB 8.4* 8.4*  HCT 25.2* 25.3*  PLT 355 384    Recent Labs  11/14/12 0630 11/15/12 0445  NA 136 136  K 3.6 3.6  CL 101 100  CO2 27 26  GLUCOSE 128* 145*  BUN 18 16  CREATININE 0.84 0.84  CALCIUM 8.5 8.3*    Recent Results (from the past 240 hour(s))  CULTURE, BLOOD (ROUTINE X 2)     Status: None   Collection Time    11/07/12 12:20 PM      Result Value Range Status   Specimen Description Blood   Final   Special Requests NONE   Final   Culture  Setup Time 11/07/2012 18:01   Final   Culture NO GROWTH 5 DAYS   Final   Report Status 11/14/2012 FINAL   Final  CULTURE, BLOOD (ROUTINE X 2)     Status: None   Collection Time    11/07/12  1:30 PM      Result Value Range Status   Specimen Description BLOOD LEFT FOOT   Final   Special Requests NONE BOTTLES DRAWN AEROBIC AND ANAEROBIC 5CC   Final   Culture  Setup Time 11/07/2012 19:48   Final   Culture NO GROWTH 5 DAYS   Final   Report Status 11/14/2012 FINAL   Final  URINE CULTURE     Status: None   Collection Time    11/07/12  1:45 PM      Result Value Range Status   Specimen Description URINE, CATHETERIZED   Final   Special Requests NONE   Final   Culture  Setup Time 11/07/2012 21:06   Final   Colony Count NO GROWTH   Final   Culture NO GROWTH   Final   Report Status 11/08/2012 FINAL   Final  MRSA PCR SCREENING     Status: None   Collection Time    11/07/12  4:00 PM      Result Value Range  Status   MRSA by PCR NEGATIVE  NEGATIVE Final   Comment:            The GeneXpert MRSA Assay (FDA     approved for NASAL specimens     only), is one component of a     comprehensive MRSA colonization     surveillance program. It is not     intended to diagnose MRSA     infection nor to guide or     monitor treatment for     MRSA infections.  URINE CULTURE     Status: None   Collection Time    11/07/12  4:06 PM      Result Value Range Status   Specimen Description URINE, CATHETERIZED   Final   Special Requests NONE   Final   Culture  Setup Time 11/07/2012 16:47   Final   Colony Count NO GROWTH   Final   Culture NO GROWTH   Final   Report Status 11/08/2012 FINAL   Final  CLOSTRIDIUM DIFFICILE BY PCR     Status: None   Collection Time     11/07/12  5:25 PM      Result Value Range Status   C difficile by pcr NEGATIVE  NEGATIVE Final  STOOL CULTURE     Status: None   Collection Time    11/07/12  5:26 PM      Result Value Range Status   Specimen Description STOOL   Final   Special Requests NONE   Final   Culture     Final   Value: NO SALMONELLA, SHIGELLA, CAMPYLOBACTER, YERSINIA, OR E.COLI 0157:H7 ISOLATED   Report Status 11/11/2012 FINAL   Final     Studies/Results: No results found.  Medications: Scheduled Meds: . ciprofloxacin  400 mg Intravenous Q12H  . heparin subcutaneous  5,000 Units Subcutaneous Q8H  . ipratropium  0.5 mg Nebulization TID  . levalbuterol  0.63 mg Nebulization TID  . metronidazole  500 mg Intravenous Q8H  . pantoprazole (PROTONIX) IV  40 mg Intravenous Q12H  . sodium chloride  10-40 mL Intracatheter Q12H   Continuous Infusions: . sodium chloride    . diltiazem (CARDIZEM) infusion 5 mg/hr (11/15/12 1552)  . TPN (CLINIMIX) +/- additives 80 mL/hr at 11/14/12 1811   And  . fat emulsion 250 mL (11/14/12 1811)  . TPN (CLINIMIX) +/- additives     PRN Meds:.sodium chloride, fentaNYL, levalbuterol, sodium chloride    LOS: 8 days   Lasharn Bufkin,CHRISTOPHER  [facility] Pager [phone]. If 8PM-8AM, please contact night-coverage at [email or web address], password [facility] 11/15/2012, 5:34 PM  LOS: 8 days

## 2012-11-15 NOTE — Progress Notes (Signed)
PATIENT CONT. TO AT OF 60 ML/ NOT CLOTS.OFTIMES HAS LARGE AMTS OF URINE COMING OUT FROM AROUND FOLEY. IT WAS BLOODY LOOKING EARLIER TONIGHT. FLUSH WITH 60 ML N.S. AND GOT RETURN OF N.S. PLUS BLOOD URINE NO CLOTS. CONT. TO MONITOR OUT PUT. R.N. Eather Colas.

## 2012-11-15 NOTE — Progress Notes (Signed)
Patient stated that he did not want to wear his CPAP tonight due to frequent urination.

## 2012-11-16 DIAGNOSIS — N400 Enlarged prostate without lower urinary tract symptoms: Secondary | ICD-10-CM

## 2012-11-16 LAB — COMPREHENSIVE METABOLIC PANEL
ALT: 22 U/L (ref 0–53)
AST: 18 U/L (ref 0–37)
Alkaline Phosphatase: 62 U/L (ref 39–117)
CO2: 23 mEq/L (ref 19–32)
GFR calc Af Amer: >90 mL/min (ref >90)
Glucose, Bld: 143 mg/dL — ABNORMAL HIGH (ref 70–99)
Potassium: 3.8 mEq/L (ref 3.5–5.1)
Sodium: 132 mEq/L — ABNORMAL LOW (ref 135–145)
Total Protein: 6.7 g/dL (ref 6.0–8.3)

## 2012-11-16 LAB — CBC
HCT: 26.1 % — ABNORMAL LOW (ref 39.0–52.0)
MCH: 30 pg (ref 26.0–34.0)
MCHC: 34.1 g/dL (ref 30.0–36.0)
RDW: 14.5 % (ref 11.5–15.5)

## 2012-11-16 LAB — PHOSPHORUS: Phosphorus: 3.9 mg/dL (ref 2.3–4.6)

## 2012-11-16 MED ORDER — HYDROCODONE-ACETAMINOPHEN 5-325 MG PO TABS
1.0000 | ORAL_TABLET | ORAL | Status: DC | PRN
  Administered 2012-11-16: 2 via ORAL
  Administered 2012-11-16: 1 via ORAL
  Administered 2012-11-16 – 2012-11-17 (×2): 2 via ORAL
  Filled 2012-11-16: qty 2
  Filled 2012-11-16: qty 1
  Filled 2012-11-16 (×2): qty 2

## 2012-11-16 MED ORDER — DILTIAZEM HCL 60 MG PO TABS
60.0000 mg | ORAL_TABLET | Freq: Three times a day (TID) | ORAL | Status: DC
  Administered 2012-11-16 – 2012-11-17 (×3): 60 mg via ORAL
  Filled 2012-11-16 (×6): qty 1

## 2012-11-16 MED ORDER — PANTOPRAZOLE SODIUM 40 MG PO TBEC
40.0000 mg | DELAYED_RELEASE_TABLET | Freq: Two times a day (BID) | ORAL | Status: DC
  Administered 2012-11-16 – 2012-11-17 (×2): 40 mg via ORAL
  Filled 2012-11-16 (×2): qty 1

## 2012-11-16 NOTE — Progress Notes (Signed)
[facility] PROGRESS NOTE  James [name] WUJ:[id number] DOB: 14-Oct-1950 DOA: 11/07/2012 PCP: Sanda Linger, MD  Assessment/Plan: Principal Problem:   Hypovolemic shock Active Problems:   Abdominal pain   Emesis   Diarrhea   Swollen upper lip   Paroxysmal a-fib, spont conversion to NSR   History of hypertension   COPD (chronic obstructive pulmonary disease)   Hyperlipidemia   Hyperglycemia, stress related   Elevated alkaline phosphatase level   SIRS criteria on admission (leukocytosis, hypotension, hypothermia)   Enteritis   Ischemic colitis    1. Ischemic colitis: Patient presented with abdominal pain, nausea and vomiting. Imaging studies were unhelpful, and patient did have a known history of vascular disease, raising concern for bowel ischemia. Dr Luisa Hart provided surgical consultation, and patient underwent emergent exploratory laparotomy with descending and sigmoid colectomy; wound vac placement and colostomy on 11/08/12. Now POD# 8, managing per CCS. On TPN. Patient had vomiting with associated abdominal pain, concerning for ostomy ischemia, as well as post-operative ileus, but this appears to have resolved. On Ciprofloxacin/Flagyl, day #10. NG-T drainage was clamped on 11/14/12 per surgical team, and then discontinued. Patient tolerated clears on 11/15/12, and was advanced to regular diet today. Colostomy is fucntioning well. Will transition to oral medication today. Will likely start weaning TPN from 11/17/12.  2. Hypovolemic Shock: This was present at presentation and peri-opreratively, in setting of ischemic colitis; resolved.  3. COPD : This appears stable. Patient is on Symbicort at baseline. Managed with scheduled bronchodilators (Utilizing Xopenex due to tachycardia), bronchial hygiene and oxygen supplementation, with satisfactory response. Weaned successfully off oxygen, and as of 11/15/12, is saturating at 92%-95% on RA.  4. Swollen upper lip: This was an incidental finding,  involving eyelid and lip edema, no tongue involvement, unclear etiology. Now resolved.  5. Atrial Fibrillation: PAF. Episode noted in [facility], and again 11/09/12.  Intermittent in ICU. TSH WNL. Echocardiogram showed grade 1 diastolic dysfunction, septal flattening suggestive of possible RV volume & pressure overload, RV mildly dilated. No Heparin drip as post-op and concerns for ischemia to ostomy. Now in SR. On iv Cardizem. Will change to oral formulation today.  6. Electrolyte abnormalities: Hyponatremia, Hypochloremia, now resolved. Hypokalemia, hypomagnesemia. Following electrolytes and repleting as indicated.  7. Reflux/GERD:  On Protonix 40 bid.  8. Anemia of critical illness: HB ranging between 7.6-8.2. Following CBC. Stable. Transfuse for Hb < 7.  9. Delirium: Likely due to opioids/PCA. Managed with reduction of opioids, behavioral modifiers, chair, mobilize. PT consult. Resolved. Mental status now at baseline.  10. Urinary retention: Patient had dirbbling incontinence/leakage around Foley catheter from 11/12/12. Flushed today, with evacuation of >1000 ml urine. Likely, this was due to a blocked Foley catheter. Foley catheter was discontinued in AM 11/15/12, without resultant retention. Voiding normally.    Code Status: Full Code.  Family Communication:  Disposition Plan: To be determined.    Brief narrative: 62 y/o WM with PMH of COPD, Lung cancer s/p resection at [facility] who presented to [facility] with acute onset abdominal pain with associated n/v/d. Admitted to [facility] ICU by Dr Billy Fischer, for further evaluation,on suspicion of possible ishemia of colon or small bowel. Surgical consultation was provided by Dr Luisa Hart, and patient is s/p emergent exploratory laparotomy 11/08/2012, with descending and sigmoid colectomy; wound vac placement and colostomy due to increasing abdominal pain with negative radiographic work up, as well as a history of vascular disease warranting concern for  ischemic colitis. Clinical course was complicated by hypovolemic  shock, electrolyte abnormalities and PAF. Patient was transferred to [facility] service, effective 11/13/12.     Consultants:  PCCM; Billy Fischer.   Dr Luisa Hart, Careers adviser.   Procedures:  Serial CXRs.  Abdominal X-Rays.  Abdominal/Pelvic CTA.    CTA chest.   Antibiotics:  Ciprofloxacin 11/07/12>>>  Flagyl 11/07/12>>>  HPI/Subjective: Feel much better.   Objective: Vital signs in last 24 hours: Temp:  [98.6 F (37 C)-99.9 F (37.7 C)] 99 F (37.2 C) (02/16 0357) Pulse Rate:  [91-102] 102 (02/16 0357) Resp:  [16-18] 18 (02/16 0357) BP: (112-161)/(62-73) 112/62 mmHg (02/16 0357) SpO2:  [92 %-96 %] 92 % (02/16 0804) FiO2 (%):  [21 %] 21 % (02/16 0804) Weight:  [92.352 kg (203 lb 9.6 oz)] 92.352 kg (203 lb 9.6 oz) (02/16 0357) Weight change: -0.045 kg (-1.6 oz) Last BM Date: 11/15/12  Intake/Output from previous day: 02/15 0701 - 02/16 0700 In: 2435 [P.O.:420; I.V.:85; IV Piggyback:400; TPN:1530] Out: 3025 [Urine:3025] Total I/O In: 360 [P.O.:360] Out: 400 [Urine:400]   Physical Exam: General: Comfortable, alert, communicative, fully oriented, not short of breath at rest.  HEENT:  Moderate clinical pallor, no jaundice, no conjunctival injection or discharge. NECK:  Supple, JVP not seen, no carotid bruits, no palpable lymphadenopathy, no palpable goiter. CHEST:  Clinically clear to auscultation, no wheezes, no crackles. HEART:  Sounds 1 and 2 heard, normal, regular, no murmurs. ABDOMEN:  Full, soft, healing mid-line surgical wound, non-tender, no palpable organomegaly, no palpable masses. Bowel sounds heard. Colostomy bag full of feces.  GENITALIA:  Not examined. LOWER EXTREMITIES:  No pitting edema, palpable peripheral pulses. MUSCULOSKELETAL SYSTEM:  Generalized osteoarthritic changes, otherwise, normal. CENTRAL NERVOUS SYSTEM:  No focal neurologic deficit on gross examination.  Lab Results:  Recent  Labs  11/15/12 0445 11/16/12 0700  WBC 13.8* 16.0*  HGB 8.4* 8.9*  HCT 25.3* 26.1*  PLT 384 446*    Recent Labs  11/15/12 0445 11/16/12 0700  NA 136 132*  K 3.6 3.8  CL 100 97  CO2 26 23  GLUCOSE 145* 143*  BUN 16 17  CREATININE 0.84 0.86  CALCIUM 8.3* 8.4   Recent Results (from the past 240 hour(s))  CULTURE, BLOOD (ROUTINE X 2)     Status: None   Collection Time    11/07/12 12:20 PM      Result Value Range Status   Specimen Description Blood   Final   Special Requests NONE   Final   Culture  Setup Time 11/07/2012 18:01   Final   Culture NO GROWTH 5 DAYS   Final   Report Status 11/14/2012 FINAL   Final  CULTURE, BLOOD (ROUTINE X 2)     Status: None   Collection Time    11/07/12  1:30 PM      Result Value Range Status   Specimen Description BLOOD LEFT FOOT   Final   Special Requests NONE BOTTLES DRAWN AEROBIC AND ANAEROBIC 5CC   Final   Culture  Setup Time 11/07/2012 19:48   Final   Culture NO GROWTH 5 DAYS   Final   Report Status 11/14/2012 FINAL   Final  URINE CULTURE     Status: None   Collection Time    11/07/12  1:45 PM      Result Value Range Status   Specimen Description URINE, CATHETERIZED   Final   Special Requests NONE   Final   Culture  Setup Time 11/07/2012 21:06   Final   Colony Count NO GROWTH  Final   Culture NO GROWTH   Final   Report Status 11/08/2012 FINAL   Final  MRSA PCR SCREENING     Status: None   Collection Time    11/07/12  4:00 PM      Result Value Range Status   MRSA by PCR NEGATIVE  NEGATIVE Final   Comment:            The GeneXpert MRSA Assay (FDA     approved for NASAL specimens     only), is one component of a     comprehensive MRSA colonization     surveillance program. It is not     intended to diagnose MRSA     infection nor to guide or     monitor treatment for     MRSA infections.  URINE CULTURE     Status: None   Collection Time    11/07/12  4:06 PM      Result Value Range Status   Specimen Description URINE,  CATHETERIZED   Final   Special Requests NONE   Final   Culture  Setup Time 11/07/2012 16:47   Final   Colony Count NO GROWTH   Final   Culture NO GROWTH   Final   Report Status 11/08/2012 FINAL   Final  CLOSTRIDIUM DIFFICILE BY PCR     Status: None   Collection Time    11/07/12  5:25 PM      Result Value Range Status   C difficile by pcr NEGATIVE  NEGATIVE Final  STOOL CULTURE     Status: None   Collection Time    11/07/12  5:26 PM      Result Value Range Status   Specimen Description STOOL   Final   Special Requests NONE   Final   Culture     Final   Value: NO SALMONELLA, SHIGELLA, CAMPYLOBACTER, YERSINIA, OR E.COLI 0157:H7 ISOLATED   Report Status 11/11/2012 FINAL   Final     Studies/Results: No results found.  Medications: Scheduled Meds: . ciprofloxacin  400 mg Intravenous Q12H  . heparin subcutaneous  5,000 Units Subcutaneous Q8H  . ipratropium  0.5 mg Nebulization TID  . levalbuterol  0.63 mg Nebulization TID  . metronidazole  500 mg Intravenous Q8H  . pantoprazole (PROTONIX) IV  40 mg Intravenous Q12H  . sodium chloride  10-40 mL Intracatheter Q12H   Continuous Infusions: . sodium chloride    . diltiazem (CARDIZEM) infusion 5 mg/hr (11/15/12 1552)   PRN Meds:.sodium chloride, fentaNYL, HYDROcodone-acetaminophen, levalbuterol, sodium chloride    LOS: 9 days   Shyteria Lewis,CHRISTOPHER  [facility] Pager [phone]. If 8PM-8AM, please contact night-coverage at [email or web address], password [facility] 11/16/2012, 11:35 AM  LOS: 9 days

## 2012-11-16 NOTE — Progress Notes (Signed)
General Surgery Note  LOS: 9 days  POD -   8 Days Post-Op Room - 2012  Assessment/Plan: 1.  EXPLORATORY LAPAROTOMY with descending and sigmoid colectomy for ischemic colitis, VAC placement and colostomy - T. Cornett - 11/08/2012  ON Cipro/Flagyl (can probably stop these when he has had a total of 10 days of antibiotics)  WBC - 13,800 - 11/15/2012 - labs pending today.  Overall looks good.  Advance to regular diet   2.  Midline wound   Now with TID dressing changes 3.  Foley  Tolerated this out 4. DVT prophylaxis  SQ heparin 5.  Anemia - Hgb - 8.4 - 11/15/2012 6.  On cardizem - will leave this management to medicine.  Subjective:  Doing well.  Colostomy functioning. Voiding without difficulty.  Objective:   Filed Vitals:   11/16/12 0357  BP: 112/62  Pulse: 102  Temp: 99 F (37.2 C)  Resp: 18     Intake/Output from previous day:  02/15 0701 - 02/16 0700 In: 2435 [P.O.:420; I.V.:85; IV Piggyback:400; TPN:1530] Out: 3025 [Urine:3025]  Intake/Output this shift:      Physical Exam:   General: WN older WM who is alert and oriented.    HEENT: Normal. Pupils equal. .   Lungs: Clear   Abdomen: Softer. BS present.  Colostomy functioning.   Wound:  Now with dressing changes to midline wound.   Neurologic:  Grossly intact to motor and sensory function.     Lab Results:     Recent Labs  11/14/12 0630 11/15/12 0445  WBC 13.0* 13.8*  HGB 8.4* 8.4*  HCT 25.2* 25.3*  PLT 355 384    BMET    Recent Labs  11/14/12 0630 11/15/12 0445  NA 136 136  K 3.6 3.6  CL 101 100  CO2 27 26  GLUCOSE 128* 145*  BUN 18 16  CREATININE 0.84 0.84  CALCIUM 8.5 8.3*    PT/INR  No results found for this basename: LABPROT, INR,  in the last 72 hours  ABG  No results found for this basename: PHART, PCO2, PO2, HCO3,  in the last 72 hours   Studies/Results:  No results found.   Anti-infectives:   Anti-infectives   Start     Dose/Rate Route Frequency Ordered Stop   11/10/12  1400  metroNIDAZOLE (FLAGYL) IVPB 500 mg     500 mg 100 mL/hr over 60 Minutes Intravenous Every 8 hours 11/10/12 1036     11/07/12 1800  vancomycin (VANCOCIN) 50 mg/mL oral solution 500 mg  Status:  Discontinued     500 mg Oral 4 times per day 11/07/12 1649 11/08/12 1934   11/07/12 1800  metroNIDAZOLE (FLAGYL) IVPB 500 mg  Status:  Discontinued     500 mg 100 mL/hr over 60 Minutes Intravenous 4 times per day 11/07/12 1649 11/10/12 1036   11/07/12 1700  ciprofloxacin (CIPRO) IVPB 400 mg     400 mg 200 mL/hr over 60 Minutes Intravenous Every 12 hours 11/07/12 1649     11/07/12 1336  ceFEPIme (MAXIPIME) 1 G injection    Comments:  HENSON, VICKIE: cabinet override      11/07/12 1336 11/07/12 1356   11/07/12 1230  vancomycin (VANCOCIN) IVPB 1000 mg/200 mL premix     1,000 mg 200 mL/hr over 60 Minutes Intravenous  Once 11/07/12 1226 11/07/12 1515   11/07/12 1230  ceFEPIme (MAXIPIME) 1 g in dextrose 5 % 50 mL IVPB  Status:  Discontinued     1 g  100 mL/hr over 30 Minutes Intravenous  Once 11/07/12 1226 11/07/12 1736      Ovidio Kin, MD, FACS Pager: [phone],   [facility] Surgery Office: [phone] 11/16/2012

## 2012-11-17 LAB — BASIC METABOLIC PANEL
BUN: 16 mg/dL (ref 6–23)
Calcium: 8.2 mg/dL — ABNORMAL LOW (ref 8.4–10.5)
Creatinine, Ser: 0.98 mg/dL (ref 0.50–1.35)
GFR calc Af Amer: >90 mL/min (ref >90)
GFR calc non Af Amer: 86 mL/min — ABNORMAL LOW (ref >90)
Potassium: 4.1 mEq/L (ref 3.5–5.1)

## 2012-11-17 LAB — CBC
HCT: 24.2 % — ABNORMAL LOW (ref 39.0–52.0)
MCH: 28.8 pg (ref 26.0–34.0)
MCHC: 32.6 g/dL (ref 30.0–36.0)
MCV: 88.3 fL (ref 78.0–100.0)
RDW: 15.1 % (ref 11.5–15.5)

## 2012-11-17 MED ORDER — PRO-STAT SUGAR FREE PO LIQD
30.0000 mL | Freq: Two times a day (BID) | ORAL | Status: DC
  Filled 2012-11-17 (×2): qty 30

## 2012-11-17 MED ORDER — PRO-STAT SUGAR FREE PO LIQD: 30.0000 mL | Freq: Two times a day (BID) | ORAL | Status: DC

## 2012-11-17 NOTE — Progress Notes (Signed)
9 Days Post-Op  Subjective: Pt feeling great, eating breakfast.  Pt denies N/V.  Pt having good ostomy BM's and gas in bag.  Pt denies pain.  Pt ready to go home soon!  Objective: Vital signs in last 24 hours: Temp:  [98.9 F (37.2 C)-99.5 F (37.5 C)] 99.5 F (37.5 C) (02/17 0359) Pulse Rate:  [88-92] 88 (02/17 0359) Resp:  [18-20] 18 (02/17 0359) BP: (106-132)/(48-63) 132/63 mmHg (02/17 0359) SpO2:  [92 %-97 %] 93 % (02/17 0359) FiO2 (%):  [21 %] 21 % (02/16 1348) Weight:  [203 lb 12.8 oz (92.443 kg)] 203 lb 12.8 oz (92.443 kg) (02/17 0359) Last BM Date: 11/16/12  Intake/Output from previous day: 02/16 0701 - 02/17 0700 In: 720 [P.O.:720] Out: 2050 [Urine:1050; Stool:1000] Intake/Output this shift:    PE: Gen:  Alert, NAD, pleasant Abd: Soft, NT/ND, +BS, no HSM, incisions C/D/I, ostomy patent with large amount of brown stool in bag   Lab Results:   Recent Labs  11/16/12 0700 11/17/12 0500  WBC 16.0* 14.0*  HGB 8.9* 7.9*  HCT 26.1* 24.2*  PLT 446* 468*   BMET  Recent Labs  11/16/12 0700 11/17/12 0500  NA 132* 134*  K 3.8 4.1  CL 97 101  CO2 23 26  GLUCOSE 143* 90  BUN 17 16  CREATININE 0.86 0.98  CALCIUM 8.4 8.2*   PT/INR No results found for this basename: LABPROT, INR,  in the last 72 hours CMP     Component Value Date/Time   NA 134* 11/17/2012 0500   K 4.1 11/17/2012 0500   CL 101 11/17/2012 0500   CO2 26 11/17/2012 0500   GLUCOSE 90 11/17/2012 0500   BUN 16 11/17/2012 0500   CREATININE 0.98 11/17/2012 0500   CALCIUM 8.2* 11/17/2012 0500   PROT 6.7 11/16/2012 0700   ALBUMIN 2.8* 11/16/2012 0700   AST 18 11/16/2012 0700   ALT 22 11/16/2012 0700   ALKPHOS 62 11/16/2012 0700   BILITOT 0.2* 11/16/2012 0700   GFRNONAA 86* 11/17/2012 0500   GFRAA >90 11/17/2012 0500   Lipase     Component Value Date/Time   LIPASE 45 11/07/2012 1220       Studies/Results: No results found.  Anti-infectives: Anti-infectives   Start     Dose/Rate Route Frequency  Ordered Stop   11/10/12 1400  metroNIDAZOLE (FLAGYL) IVPB 500 mg     500 mg 100 mL/hr over 60 Minutes Intravenous Every 8 hours 11/10/12 1036     11/07/12 1800  vancomycin (VANCOCIN) 50 mg/mL oral solution 500 mg  Status:  Discontinued     500 mg Oral 4 times per day 11/07/12 1649 11/08/12 1934   11/07/12 1800  metroNIDAZOLE (FLAGYL) IVPB 500 mg  Status:  Discontinued     500 mg 100 mL/hr over 60 Minutes Intravenous 4 times per day 11/07/12 1649 11/10/12 1036   11/07/12 1700  ciprofloxacin (CIPRO) IVPB 400 mg     400 mg 200 mL/hr over 60 Minutes Intravenous Every 12 hours 11/07/12 1649     11/07/12 1336  ceFEPIme (MAXIPIME) 1 G injection    Comments:  HENSON, VICKIE: cabinet override      11/07/12 1336 11/07/12 1356   11/07/12 1230  vancomycin (VANCOCIN) IVPB 1000 mg/200 mL premix     1,000 mg 200 mL/hr over 60 Minutes Intravenous  Once 11/07/12 1226 11/07/12 1515   11/07/12 1230  ceFEPIme (MAXIPIME) 1 g in dextrose 5 % 50 mL IVPB  Status:  Discontinued  1 g 100 mL/hr over 30 Minutes Intravenous  Once 11/07/12 1226 11/07/12 1736       Assessment/Plan 1. EXPLORATORY LAPAROTOMY with descending and sigmoid colectomy for ischemic colitis, VAC placement and colostomy - T. Cornett - 11/08/2012  ON Cipro/Flagyl (can probably stop these when he has had a total of 10 days of antibiotics) today 10/10 WBC - 14,000 - 11/17/2012 - improving Overall looks good.  Advance to regular diet  2. Midline wound  Now with TID dressing changes  3. Now urinating on own 4. DVT prophylaxis - SQ heparin  5. Anemia - Hgb - 7.9 - 11/17/2012  6. On cardizem - will leave this management to medicine.  Disp:  Doing well from a surgical standpoint (pain well controlled, tolerating regular diet, good ostomy function), can likely go home when medically ready.  Won't need any antibiotics.  Will need to f/u with Dr. Luisa Hart in 2 weeks.  Pt will likely need HH to help with ostomy and wound management.     LOS: 10  days    Aris [place] 11/17/2012, 7:42 AM Pager: [phone]

## 2012-11-17 NOTE — Discharge Summary (Addendum)
Physician Discharge Summary  James [name] ZOX:[id number] DOB: 16-Apr-1951 DOA: 11/07/2012  PCP: Sanda Linger, MD  Admit date: 11/07/2012 Discharge date: 11/17/2012  Time spent: 40 minutes  Recommendations for Outpatient Follow-up:  1. Follow up with PMD. 2. Follow up with Dr Luisa Hart, surgeon, in 2 weeks.  3. HH RN/Rolling Walker.   Discharge Diagnoses:  Principal Problem:   Hypovolemic shock Active Problems:   Abdominal pain   Emesis   Diarrhea   Swollen upper lip   Paroxysmal a-fib, spont conversion to NSR   History of hypertension   COPD (chronic obstructive pulmonary disease)   Hyperlipidemia   Hyperglycemia, stress related   Elevated alkaline phosphatase level   SIRS criteria on admission (leukocytosis, hypotension, hypothermia)   Enteritis   Ischemic colitis   Discharge Condition: Satisfactory.  Diet recommendation: Regular.   Filed Weights   11/15/12 0530 11/16/12 0357 11/17/12 0359  Weight: 92.398 kg (203 lb 11.2 oz) 92.352 kg (203 lb 9.6 oz) 92.443 kg (203 lb 12.8 oz)    History of present illness:  62 y/o WM with PMH of COPD, Lung cancer s/p resection at [facility] who presented to [facility] with acute onset abdominal pain with associated n/v/d. Admitted to [facility] ICU by Dr Billy Fischer, for further evaluation,on suspicion of possible ishemia of colon or small bowel. Surgical consultation was provided by Dr Luisa Hart, and patient is s/p emergent exploratory laparotomy 11/08/2012, with descending and sigmoid colectomy; wound vac placement and colostomy due to increasing abdominal pain with negative radiographic work up, as well as a history of vascular disease warranting concern for ischemic colitis. Clinical course was complicated by hypovolemic shock, electrolyte abnormalities and PAF. Patient was transferred to [facility] service, effective 11/13/12.    Hospital Course:  1. Ischemic colitis: Patient presented with abdominal pain, nausea and vomiting. Imaging studies were  unhelpful, and patient did have a known history of vascular disease, raising concern for bowel ischemia. Dr Luisa Hart provided surgical consultation, and patient underwent emergent exploratory laparotomy with descending and sigmoid colectomy; wound vac placement and colostomy on 11/08/12. Post-operatively, patient had vomiting with associated abdominal pain, concerning for ostomy ischemia, as well as post-operative ileus, but was satisfactorily addressed with NG-T placement, bowel rest and parenteral TPN, and has npw resolved. NG-T drainage was clamped on 11/14/12 per surgical team, and then discontinued. Patient tolerated clears on 11/15/12, and was advanced to regular diet without problems on 11/16/12. Colostomy is fucntioning well. Patient completed 11 days of Ciprofloxacin/Flagyl omn 11/17/12.  2. Hypovolemic Shock: This was present at presentation and peri-opreratively, in setting of ischemic colitis; resolved.  3. COPD : This appears stable. Patient is on Symbicort at baseline. Managed with scheduled bronchodilators (Utilizing Xopenex due to tachycardia), bronchial hygiene and oxygen supplementation, with satisfactory response. Weaned successfully off oxygen, and as of 11/15/12, is saturating at 92%-95% on RA.  4. Swollen upper lip: This was an incidental finding, involving eyelid and lip edema, no tongue involvement, unclear etiology. Now resolved.  5. Atrial Fibrillation: PAF. Episode noted in [facility], and again 11/09/12. Intermittent in ICU. TSH WNL. Echocardiogram showed grade 1 diastolic dysfunction, septal flattening suggestive of possible RV volume & pressure overload, RV mildly dilated. No Heparin drip as post-op and concerns for ischemia to ostomy. Patient remained in SR on iv Cardizem, and was transitioned to oral formulation on 11/16/12.  6. Electrolyte abnormalities: Hyponatremia, Hypochloremia, now resolved. Hypokalemia, hypomagnesemia. Followed electrolytes and repleted as indicated.  7.  Reflux/GERD: On Protonix 40 bid.  8. Anemia of critical illness: HB ranging between 7.6-8.2. Followed CBC. Stable.  9. Delirium: Likely due to opioids/PCA. Managed with reduction of opioids, behavioral modifiers, chair, mobilized. PT consuledt. Resolved. Mental status now at baseline.  10. Urinary retention: Patient had dribbling incontinence/leakage around Foley catheter from 11/12/12. Flushed on 11/14/12, with evacuation of >1000 ml urine. Likely, this was due to a blocked Foley catheter. Foley catheter was discontinued in AM 11/15/12, without resultant retention. Now voiding normally.      Procedures:  See below.   Consultations: PCCM; Billy Fischer.  Dr Luisa Hart, Careers adviser.    Discharge Exam: Filed Vitals:   11/16/12 2131 11/17/12 0359 11/17/12 0926 11/17/12 1039  BP: 106/52 132/63    Pulse: 90 88  94  Temp: 98.9 F (37.2 C) 99.5 F (37.5 C)    TempSrc: Oral Oral    Resp: 20 18    Height:      Weight:  92.443 kg (203 lb 12.8 oz)    SpO2: 94% 93% 92% 94%    General: Comfortable, alert, communicative, fully oriented, not short of breath at rest.  HEENT: Moderate clinical pallor, no jaundice, no conjunctival injection or discharge.  NECK: Supple, JVP not seen, no carotid bruits, no palpable lymphadenopathy, no palpable goiter.  CHEST: Clinically clear to auscultation, no wheezes, no crackles.  HEART: Sounds 1 and 2 heard, normal, regular, no murmurs.  ABDOMEN: Full, soft, healing mid-line surgical wound, non-tender, no palpable organomegaly, no palpable masses. Bowel sounds heard. Colostomy bag full of feces.  GENITALIA: Not examined.  LOWER EXTREMITIES: No pitting edema, palpable peripheral pulses.  MUSCULOSKELETAL SYSTEM: Generalized osteoarthritic changes, otherwise, normal.  CENTRAL NERVOUS SYSTEM: No focal neurologic deficit on gross examination.  Discharge Instructions      Discharge Orders   Future Orders Complete By Expires     Diet - low sodium heart healthy  As  directed     Increase activity slowly  As directed         Medication List    STOP taking these medications       lisinopril 20 MG tablet  Commonly known as:  PRINIVIL,ZESTRIL     sodium chloride 0.9 % nebulizer solution     traMADol 50 MG tablet  Commonly known as:  ULTRAM      TAKE these medications       albuterol 108 (90 BASE) MCG/ACT inhaler  Commonly known as:  PROVENTIL HFA;VENTOLIN HFA  Inhale 2 puffs into the lungs every 6 (six) hours as needed for wheezing. For shortness of breath     budesonide-formoterol 160-4.5 MCG/ACT inhaler  Commonly known as:  SYMBICORT  Inhale 2 puffs into the lungs 2 (two) times daily.     diltiazem 240 MG 24 hr capsule  Commonly known as:  TIAZAC  Take 240 mg by mouth 2 (two) times daily.     feeding supplement Liqd  Take 30 mLs by mouth 2 (two) times daily with a meal.     finasteride 5 MG tablet  Commonly known as:  PROSCAR  Take 5 mg by mouth daily.     fish oil-omega-3 fatty acids 1000 MG capsule  Take 2 g by mouth 2 (two) times daily.     gemfibrozil 600 MG tablet  Commonly known as:  LOPID  Take 600 mg by mouth 2 (two) times daily before a meal.     HYDROcodone-acetaminophen 10-500 MG per tablet  Commonly known as:  LORTAB  Take 1 tablet by mouth 4 (  four) times daily as needed for pain. For pain     omeprazole 20 MG tablet  Commonly known as:  PRILOSEC OTC  Take 20 mg by mouth 2 (two) times daily.     tiotropium 18 MCG inhalation capsule  Commonly known as:  SPIRIVA  Place 18 mcg into inhaler and inhale daily.       Follow-up Information   Follow up with CORNETT,THOMAS A., MD. Call in 2 weeks.   Contact information:   [address] [place] [address] [phone]       Schedule an appointment as soon as possible for a visit with Sanda Linger, MD.   Contact information:   [address] [place] [address] [phone]        The results of significant  diagnostics from this hospitalization (including imaging, microbiology, ancillary and laboratory) are listed below for reference.    Significant Diagnostic Studies: Ct Angio Chest Pe W/cm &/or Wo Cm  11/07/2012  *RADIOLOGY REPORT*  Clinical Data:  Abdominal pain, hypotension, fever and chills.  CT ANGIOGRAPHY CHEST, ABDOMEN AND PELVIS  Technique:  Multidetector CT imaging through the chest, abdomen and pelvis was performed using the standard protocol during bolus administration of intravenous contrast.  Multiplanar reconstructed images including MIPs were obtained and reviewed to evaluate the vascular anatomy.  Contrast: , OMNIPAQUE IOHEXOL 350 MG/ML SOLN  Comparison:   None.  CTA CHEST  Findings:  The ascending thoracic aorta is mildly dilated, measuring approximately 4.1 - 4.2 cm in greatest caliber.  This dilatation begins just above the sinotubular junction and continues to nearly the aortic arch.  There is no associated dissection. Correlation suggested with echocardiography electively to exclude significant aortic valvular disease.  Irregular and ulcerated plaque is present at the level of the aortic arch.  Proximal great vessels show no significant occlusive disease.  The descending thoracic aorta is of normal caliber.  The heart size is normal.  Scattered emphysematous bullae are present in the lungs.  Parenchymal scarring and atelectasis present bilaterally.  No focal pulmonary infiltrate, pulmonary edema or pleural fluid is identified.  No bony abnormalities.   Review of the MIP images confirms the above findings.  IMPRESSION:  1.  Mild dilatation of the ascending thoracic aorta, measuring 4.1 - 4.2 cm in greatest caliber.  Recommend elective correlation with echocardiography to exclude significant aortic valvular disease. 2.  Atherosclerotic disease of the aorta.  No aortic dissection is identified.  CTA ABDOMEN AND PELVIS  Findings:  The abdominal aorta shows scattered atherosclerotic disease  without evidence of aneurysm or dissection.  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  Mild eccentric narrowing of the proximal SMA is present of approximately 30%.  The inferior mesenteric artery is not well delineated and may be chronically occluded.  Single left renal artery and three separate left renal arteries are present.  There is some atrophy involving the posterior and medial aspect of the left kidney with appearance suggestive of old vascular insult/infarct.  This may be secondary to occlusion of a segmental renal artery branch or separate fourth renal artery in the past.  A patent right sided iliac stent is present in the common iliac artery.  No other evidence of significant occlusive disease involving the iliac arteries or common femoral arteries.  Nonvascular evaluation shows some degree of dilatation of the entire colon which contains stool and liquid.  Appearance suggests  the possibility of enteritis.  No evidence of bowel obstruction or perforation.  No focal abscess is identified.  At least two large gallstones are present.  The gallbladder is not thickened or distended.  No masses, hernias or enlarged lymph nodes are identified.  Degenerative changes are present in the lumbar spine.   Review of the MIP images confirms the above findings.  IMPRESSION:  1.  Atherosclerotic disease of the abdominal aorta and visceral arteries, as above, without evidence of aortic aneurysm or dissection.  There is chronic atrophy of the posterior aspect of the left kidney having an appearance consistent with prior vascular insult. 2.  Patent right common iliac artery stent 3.  Cholelithiasis without overt CT evidence of cholecystitis 4.  Diffuse dilatation of the colon containing stool and liquid. Findings are suggestive of enteritis.  No associated abscess or small bowel dilatation.   Original Report Authenticated By: Irish Lack, M.D.    Dg Chest Port 1  View  11/10/2012  *RADIOLOGY REPORT*  Clinical Data: Patient pulled central line; check tip position.  PORTABLE CHEST - 1 VIEW  Comparison: Chest radiograph performed 11/09/2012  Findings: The patient's right IJ line is noted barely overlying the right internal jugular vein at its tip.  The lungs are mildly hypoexpanded.  There is a likely small left pleural effusion, with associated airspace opacity.  Mild right basilar atelectasis is noted.  Vascular crowding is seen.  No pneumothorax is identified.  The cardiomediastinal silhouette is normal in size.  No acute osseous abnormalities are identified.  IMPRESSION:  1.  Right IJ line noted near the overlying the right internal jugular vein, at its tip. 2.  Lungs mildly hypoexpanded; likely persistent small left pleural effusion, with associated airspace opacity.  Mild right basilar atelectasis noted.  These results were called by telephone on 11/10/2012 at 02:16 a.m. to [facility] on [id number], who verbally acknowledged these results.   Original Report Authenticated By: Tonia Ghent, M.D.    Dg Chest Port 1 View  11/09/2012  *RADIOLOGY REPORT*  Clinical Data: Follow-up atelectasis  PORTABLE CHEST - 1 VIEW  Comparison: Chest radiograph to 03/2013  Findings: Right central venous line is unchanged.  Interval introduction of NG tube with tip in the gastric fundus.  Stable cardiac silhouette.  There is left lower lobe atelectasis with small effusion not changed.  No evidence of pulmonary edema.  Low lung volumes.  IMPRESSION: 1.  Interval introduction of an NG tube. 2.  Low lung volumes and left basilar atelectasis.   Original Report Authenticated By: Genevive Bi, M.D.    Dg Chest Port 1 View  11/07/2012  *RADIOLOGY REPORT*  Clinical Data: Central line placement  PORTABLE CHEST - 1 VIEW  Comparison: Portable chest x-ray of 11/07/2012  Findings: The right IJ central venous line tip overlies the lower SVC.  No pneumothorax is seen.  Basilar atelectasis is noted left  greater than right and developing pneumonia at the left lung base cannot be excluded.  The heart is mildly enlarged.  No acute skeletal abnormality is seen.  IMPRESSION:  1.  Right IJ central venous line tip overlies the lower SVC. 2.  Prominent markings at the lung bases left greater than right most consistent with atelectasis.  Cannot exclude developing pneumonia at the left lung base.   Original Report Authenticated By: Dwyane Dee, M.D.    Dg Chest Port 1 View  11/07/2012  *RADIOLOGY REPORT*  Clinical Data: Hypotension, abdominal pain, chills, lethargy, history lung cancer, COPD, hypertension  PORTABLE CHEST - 1 VIEW  Comparison: Portable exam 1248 hours without priors for comparison  Findings: Normal heart size and mediastinal contours. Aorta is tortuous. Pulmonary vascular congestion. Minimal interstitial prominence with bibasilar atelectasis greater on left. No segmental consolidation, pleural effusion or pneumothorax. Surgical clips at right apex.  IMPRESSION: Bibasilar atelectasis greater on left.   Original Report Authenticated By: Ulyses Southward, M.D.    Dg Abd Portable 1v  11/08/2012  *RADIOLOGY REPORT*  Clinical Data: Abdominal distention  PORTABLE ABDOMEN - 1 VIEW  Comparison: None.  Findings: Supine portable views of the abdomen show distended small bowel loops measuring up to 4.2 cm in diameter.  No distention of the colon is seen, and these findings are most consistent with a partial small bowel obstruction.  Free air cannot be assessed on portable supine views, with an erect view being necessary.  IMPRESSION: Suspect partial small bowel obstruction.  Cannot assess free air on portable supine view.   Original Report Authenticated By: Dwyane Dee, M.D.    Ct Angio Abd/pel W/ And/or W/o  11/07/2012  *RADIOLOGY REPORT*  Clinical Data:  Abdominal pain, hypotension, fever and chills.  CT ANGIOGRAPHY CHEST, ABDOMEN AND PELVIS  Technique:  Multidetector CT imaging through the chest, abdomen and pelvis was  performed using the standard protocol during bolus administration of intravenous contrast.  Multiplanar reconstructed images including MIPs were obtained and reviewed to evaluate the vascular anatomy.  Contrast: , OMNIPAQUE IOHEXOL 350 MG/ML SOLN  Comparison:   None.  CTA CHEST  Findings:  The ascending thoracic aorta is mildly dilated, measuring approximately 4.1 - 4.2 cm in greatest caliber.  This dilatation begins just above the sinotubular junction and continues to nearly the aortic arch.  There is no associated dissection. Correlation suggested with echocardiography electively to exclude significant aortic valvular disease.  Irregular and ulcerated plaque is present at the level of the aortic arch.  Proximal great vessels show no significant occlusive disease.  The descending thoracic aorta is of normal caliber.  The heart size is normal.  Scattered emphysematous bullae are present in the lungs.  Parenchymal scarring and atelectasis present bilaterally.  No focal pulmonary infiltrate, pulmonary edema or pleural fluid is identified.  No bony abnormalities.   Review of the MIP images confirms the above findings.  IMPRESSION:  1.  Mild dilatation of the ascending thoracic aorta, measuring 4.1 - 4.2 cm in greatest caliber.  Recommend elective correlation with echocardiography to exclude significant aortic valvular disease. 2.  Atherosclerotic disease of the aorta.  No aortic dissection is identified.  CTA ABDOMEN AND PELVIS  Findings:  The abdominal aorta shows scattered atherosclerotic disease without evidence of aneurysm or dissection.  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  Mild eccentric narrowing of the proximal SMA is present of approximately 30%.  The inferior mesenteric artery is not well delineated and may be chronically occluded.  Single left renal artery and three separate left renal arteries are present.  There is some atrophy involving the posterior  and medial aspect of the left kidney with appearance suggestive of old vascular insult/infarct.  This may be secondary to occlusion of a segmental renal artery branch or separate fourth renal artery in the past.  A patent right sided iliac stent is present in the common iliac artery.  No other evidence of significant occlusive disease involving the iliac arteries or common femoral arteries.  Nonvascular evaluation shows some degree of dilatation of the entire colon  which contains stool and liquid.  Appearance suggests the possibility of enteritis.  No evidence of bowel obstruction or perforation.  No focal abscess is identified.  At least two large gallstones are present.  The gallbladder is not thickened or distended.  No masses, hernias or enlarged lymph nodes are identified.  Degenerative changes are present in the lumbar spine.   Review of the MIP images confirms the above findings.  IMPRESSION:  1.  Atherosclerotic disease of the abdominal aorta and visceral arteries, as above, without evidence of aortic aneurysm or dissection.  There is chronic atrophy of the posterior aspect of the left kidney having an appearance consistent with prior vascular insult. 2.  Patent right common iliac artery stent 3.  Cholelithiasis without overt CT evidence of cholecystitis 4.  Diffuse dilatation of the colon containing stool and liquid. Findings are suggestive of enteritis.  No associated abscess or small bowel dilatation.   Original Report Authenticated By: Irish Lack, M.D.     Microbiology: Recent Results (from the past 240 hour(s))  CULTURE, BLOOD (ROUTINE X 2)     Status: None   Collection Time    11/07/12  1:30 PM      Result Value Range Status   Specimen Description BLOOD LEFT FOOT   Final   Special Requests NONE BOTTLES DRAWN AEROBIC AND ANAEROBIC 5CC   Final   Culture  Setup Time 11/07/2012 19:48   Final   Culture NO GROWTH 5 DAYS   Final   Report Status 11/14/2012 FINAL   Final  URINE CULTURE      Status: None   Collection Time    11/07/12  1:45 PM      Result Value Range Status   Specimen Description URINE, CATHETERIZED   Final   Special Requests NONE   Final   Culture  Setup Time 11/07/2012 21:06   Final   Colony Count NO GROWTH   Final   Culture NO GROWTH   Final   Report Status 11/08/2012 FINAL   Final  MRSA PCR SCREENING     Status: None   Collection Time    11/07/12  4:00 PM      Result Value Range Status   MRSA by PCR NEGATIVE  NEGATIVE Final   Comment:            The GeneXpert MRSA Assay (FDA     approved for NASAL specimens     only), is one component of a     comprehensive MRSA colonization     surveillance program. It is not     intended to diagnose MRSA     infection nor to guide or     monitor treatment for     MRSA infections.  URINE CULTURE     Status: None   Collection Time    11/07/12  4:06 PM      Result Value Range Status   Specimen Description URINE, CATHETERIZED   Final   Special Requests NONE   Final   Culture  Setup Time 11/07/2012 16:47   Final   Colony Count NO GROWTH   Final   Culture NO GROWTH   Final   Report Status 11/08/2012 FINAL   Final  CLOSTRIDIUM DIFFICILE BY PCR     Status: None   Collection Time    11/07/12  5:25 PM      Result Value Range Status   C difficile by pcr NEGATIVE  NEGATIVE Final  STOOL CULTURE  Status: None   Collection Time    11/07/12  5:26 PM      Result Value Range Status   Specimen Description STOOL   Final   Special Requests NONE   Final   Culture     Final   Value: NO SALMONELLA, SHIGELLA, CAMPYLOBACTER, YERSINIA, OR E.COLI 0157:H7 ISOLATED   Report Status 11/11/2012 FINAL   Final     Labs: Basic Metabolic Panel:  Recent Labs Lab 11/12/12 0515 11/13/12 0500 11/13/12 1700 11/14/12 0630 11/15/12 0445 11/16/12 0700 11/17/12 0500  NA 143 142 137 136 136 132* 134*  K 3.0* 2.8* 3.2* 3.6 3.6 3.8 4.1  CL 106 103 102 101 100 97 101  CO2 31 29 30 27 26 23 26   GLUCOSE 147* 155* 130* 128* 145*  143* 90  BUN 22 21 19 18 16 17 16   CREATININE 0.91 0.86 0.80 0.84 0.84 0.86 0.98  CALCIUM 8.2* 8.1* 7.8* 8.5 8.3* 8.4 8.2*  MG 1.9 1.6  --  1.8 1.7 1.8  --   PHOS 3.4 4.2  --  3.4 3.6 3.9  --    Liver Function Tests:  Recent Labs Lab 11/11/12 0434 11/13/12 0500 11/14/12 0630 11/15/12 0445 11/16/12 0700  AST 33 34 29 21 18   ALT 21 26 28 24 22   ALKPHOS 89 60 64 61 62  BILITOT 0.2* 0.2* 0.3 0.2* 0.2*  PROT 5.4* 5.4* 6.2 6.0 6.7  ALBUMIN 2.2* 2.3* 2.6* 2.5* 2.8*   No results found for this basename: LIPASE, AMYLASE,  in the last 168 hours No results found for this basename: AMMONIA,  in the last 168 hours CBC:  Recent Labs Lab 11/11/12 0434 11/13/12 0500 11/14/12 0630 11/15/12 0445 11/16/12 0700 11/17/12 0500  WBC 11.0* 9.7 13.0* 13.8* 16.0* 14.0*  NEUTROABS 9.2*  --   --   --   --   --   HGB 7.6* 7.8* 8.4* 8.4* 8.9* 7.9*  HCT 22.7* 23.2* 25.2* 25.3* 26.1* 24.2*  MCV 87.6 86.9 88.1 87.8 87.9 88.3  PLT 223 306 355 384 446* 468*   Cardiac Enzymes: No results found for this basename: CKTOTAL, CKMB, CKMBINDEX, TROPONINI,  in the last 168 hours BNP: BNP (last 3 results) No results found for this basename: PROBNP,  in the last 8760 hours CBG:  Recent Labs Lab 11/11/12 1245 11/11/12 1641 11/11/12 2018 11/12/12 0004 11/12/12 0405  GLUCAP 138* 128* 142* 141* 135*       Signed:  Kentavius Dettore,CHRISTOPHER  [facility] 11/17/2012, 1:07 PM

## 2012-11-17 NOTE — Progress Notes (Signed)
NUTRITION FOLLOW UP  Intervention:    Prostat liquid protein 30 ml twice daily (100 kcals, 15 gm protein per dose) RD to follow for nutrition care plan  Nutrition Dx:   Inadequate oral intake related to altered GI function as evidenced by NPO status, resolved  New Nutrition Dx: Increased Nutrient Needs related to wound healing as evidenced by estimated nutrition needs  New Goal:   Oral intake with meals & supplements to meet >/= 90% of estimated nutrition needs, currently unmet  Monitor:   PO & supplemental intake, weight, labs, I/O's  Assessment:   TPN discontinued 2/16.  Advanced to Regular diet 2/16.  PO intake 100% per flowsheet records.  Would benefit from additional protein ---> RD to order supplement.  Height: Ht Readings from Last 1 Encounters:  11/13/12 6\' 3"  (1.905 m)    Weight Status:   Wt Readings from Last 1 Encounters:  11/17/12 203 lb 12.8 oz (92.443 kg)    Re-estimated needs:  Kcal: 2300-2400 Protein: 125-135 gm Fluid: 2.3-2.4 L  Skin: wound VAC in place to surgical abdominal wound  Diet Order: General   Intake/Output Summary (Last 24 hours) at 11/17/12 0935 Last data filed at 11/17/12 0800  Gross per 24 hour  Intake    600 ml  Output   2050 ml  Net  -1450 ml    Last BM: 2/17  Labs:   Recent Labs Lab 11/14/12 0630 11/15/12 0445 11/16/12 0700 11/17/12 0500  NA 136 136 132* 134*  K 3.6 3.6 3.8 4.1  CL 101 100 97 101  CO2 27 26 23 26   BUN 18 16 17 16   CREATININE 0.84 0.84 0.86 0.98  CALCIUM 8.5 8.3* 8.4 8.2*  MG 1.8 1.7 1.8  --   PHOS 3.4 3.6 3.9  --   GLUCOSE 128* 145* 143* 90    Scheduled Meds: . ciprofloxacin  400 mg Intravenous Q12H  . diltiazem  60 mg Oral Q8H  . heparin subcutaneous  5,000 Units Subcutaneous Q8H  . ipratropium  0.5 mg Nebulization TID  . levalbuterol  0.63 mg Nebulization TID  . metronidazole  500 mg Intravenous Q8H  . pantoprazole  40 mg Oral BID  . sodium chloride  10-40 mL Intracatheter Q12H     Continuous Infusions: . sodium chloride      Maureen Chatters, RD, LDN Pager #: [phone] After-Hours Pager #: [phone]

## 2012-11-17 NOTE — Progress Notes (Signed)
Occupational Therapy Treatment Patient Details Name: Shepard Keltz MRN: [id number] DOB: 1951/06/03 Today's Date: 11/17/2012 Time: 0272-5366 OT Time Calculation (min): 23 min  OT Assessment / Plan / Recommendation Comments on Treatment Session Pt making excellent progress with OT overall only needs distant supervision for simulated selfcare tasks and functional transfers.  Recommend shower seat, which pt/wife feel they can borrow.  No further OT DME needs.  Will discharge from acute OT services.    Follow Up Recommendations  No OT follow up;Supervision - Intermittent    Barriers to Discharge       Equipment Recommendations  None recommended by OT    Recommendations for Other Services    Frequency Min 2X/week   Plan Discharge plan remains appropriate    Precautions / Restrictions Precautions Precautions: Fall Precaution Comments: Abdominal wound vac Restrictions Weight Bearing Restrictions: No   Pertinent Vitals/Pain O2 sats 94% on room air, HR 91-94 during session    ADL  Lower Body Dressing: Simulated;Supervision/safety Where Assessed - Lower Body Dressing: Unsupported sitting Toilet Transfer: Simulated;Supervision/safety Toilet Transfer Method: Other (comment) (ambulating without assistive device) Toilet Transfer Equipment: Regular height toilet Toileting - Clothing Manipulation and Hygiene: Simulated;Supervision/safety Where Assessed - Toileting Clothing Manipulation and Hygiene: Sit on 3-in-1 or toilet Tub/Shower Transfer: Simulated;Supervision/safety Tub/Shower Transfer Method: Ambulating;Other (comment) (stepping over simulated edge of the tub) Transfers/Ambulation Related to ADLs: pt overall supervision for mobility and transfers without use of assistive device. ADL Comments: Pt needing occasional rest breaks with standing activity secondary to fatigue.  Discussed the need for a shower seat, which the pt's wife states they can borrow.        OT Goals ADL Goals ADL  Goal: Grooming - Progress: Met ADL Goal: Lower Body Dressing - Progress: Met Miscellaneous OT Goals OT Goal: Miscellaneous Goal #1 - Progress: Progressing toward goals OT Goal: Miscellaneous Goal #2 - Progress: Met  Visit Information  Last OT Received On: 11/17/12 Assistance Needed: +1    Subjective Data  Subjective: I just got to get up and walk when I get home. Patient Stated Goal: Hopefully go home the next day or so.      Cognition  Cognition Overall Cognitive Status: Appears within functional limits for tasks assessed/performed Arousal/Alertness: Awake/alert Orientation Level: Appears intact for tasks assessed Behavior During Session: [facility] for tasks performed    Mobility  Transfers Transfers: Sit to Stand Sit to Stand: 5: Supervision;Without upper extremity assist Stand to Sit: 5: Supervision;Without upper extremity assist    Exercises      Balance Balance Balance Assessed: Yes Static Standing Balance Static Standing - Balance Support: No upper extremity supported Static Standing - Level of Assistance: 5: Stand by assistance   End of Session OT - End of Session Activity Tolerance: Patient limited by fatigue Patient left: in chair;with call bell/phone within reach;with family/visitor present Nurse Communication: Mobility status      Caliyah Sieh OTR/L Pager number [phone] 11/17/2012, 10:46 AM

## 2012-11-17 NOTE — Progress Notes (Signed)
Physical Therapy Treatment and D/C note Patient Details Name: James [name] MRN: [id number] DOB: Aug 02, 1951 Today's Date: 11/17/2012 Time: 3086-5784 PT Time Calculation (min): 24 min  PT Assessment / Plan / Recommendation Comments on Treatment Session  Pt with colostomy with Modif I with mobility.  No problems with safety any longer and pt has met goals.  No more PT indicated at this time.  Do not feel pt needs HHPT f/u and pt agrees.  Needs a tall RW as pt prefers to continue to use the RW to ambulate.  Will d/c PT.      Follow Up Recommendations  No PT follow up                 Equipment Recommendations  Rolling walker with 5" wheels (Needs a tall RW) States he is going to borrow 3N1.       Frequency Other (Comment) (D/C PT)   Plan Discharge plan needs to be updated;Frequency needs to be updated    Precautions / Restrictions Precautions Precautions: Fall Precaution Comments: Abdominal wound vac Restrictions Weight Bearing Restrictions: No   Pertinent Vitals/Pain VSS, No pain    Mobility  Bed Mobility Bed Mobility: Not assessed Transfers Transfers: Sit to Stand;Stand to Sit Sit to Stand: 6: Modified independent (Device/Increase time);With upper extremity assist;From chair/3-in-1;With armrests Stand to Sit: 6: Modified independent (Device/Increase time);With upper extremity assist;With armrests;To chair/3-in-1 Details for Transfer Assistance: No cues or assist needed. Ambulation/Gait Ambulation/Gait Assistance: 6: Modified independent (Device/Increase time) Ambulation Distance (Feet): 350 Feet Assistive device: Rolling walker Ambulation/Gait Assistance Details: No difficulties with ambulation with RW.  No LOB with challenges either.  Pt progressing well.  Good postural control. Gait Pattern: Step-through pattern Gait velocity: WFL Stairs: No (Declined neeed for practice) Wheelchair Mobility Wheelchair Mobility: No    Exercises Other Exercises Other Exercises:  Standing exercise handout given:  Marching, abduction, hip extension and squats.  Also includes SLR and SAQ exercise.  Pt demonstrates exercises without difficulty.   Pt also asked this PT if he could ambulate on treadmill.  This PT instructed pt to start at slow pace and ambulated 5 min and then 10 min and work up to 30-45 min a day.     PT Goals Acute Rehab PT Goals PT Goal: Supine/Side to Sit - Progress: Met PT Goal: Sit to Supine/Side - Progress: Met PT Goal: Sit to Stand - Progress: Met PT Goal: Stand to Sit - Progress: Met PT Goal: Ambulate - Progress: Met PT Goal: Up/Down Stairs - Progress: Other (comment) (declined to practice)  Visit Information  Last PT Received On: 11/17/12 Assistance Needed: +1    Subjective Data  Subjective: "I feel great."   Cognition  Cognition Overall Cognitive Status: Appears within functional limits for tasks assessed/performed Arousal/Alertness: Awake/alert Orientation Level: Appears intact for tasks assessed Behavior During Session: [facility] for tasks performed    Balance  Balance Balance Assessed: Yes Static Standing Balance Static Standing - Balance Support: No upper extremity supported;During functional activity Static Standing - Level of Assistance: 6: Modified independent (Device/Increase time) Standardized Balance Assessment Standardized Balance Assessment: Berg Balance Test Berg Balance Test Sit to Stand: Able to stand without using hands and stabilize independently Standing Unsupported: Able to stand safely 2 minutes Sitting with Back Unsupported but Feet Supported on Floor or Stool: Able to sit safely and securely 2 minutes Stand to Sit: Sits safely with minimal use of hands Transfers: Able to transfer safely, minor use of hands Standing Unsupported with Eyes Closed: Able  to stand 10 seconds safely Standing Ubsupported with Feet Together: Able to place feet together independently and stand 1 minute safely From Standing, Reach Forward  with Outstretched Arm: Can reach confidently >25 cm (10") From Standing Position, Pick up Object from Floor: Able to pick up shoe, needs supervision From Standing Position, Turn to Look Behind Over each Shoulder: Looks behind from both sides and weight shifts well Turn 360 Degrees: Able to turn 360 degrees safely in 4 seconds or less Standing Unsupported, Alternately Place Feet on Step/Stool: Able to stand independently and safely and complete 8 steps in 20 seconds Standing Unsupported, One Foot in Front: Able to plae foot ahead of the other independently and hold 30 seconds Standing on One Leg: Able to lift leg independently and hold 5-10 seconds Total Score: 53 High Level Balance High Level Balance Comments: Scored 53/56 on Berg.  At low risk of falls without device.  Pt prefers to use RW at present however.    End of Session PT - End of Session Equipment Utilized During Treatment: Gait belt Activity Tolerance: Patient tolerated treatment well Patient left: in chair;with call bell/phone within reach Nurse Communication: Mobility status       INGOLD,Somer Trotter 11/17/2012, 11:20 AM  [facility] [phone] (pager)

## 2012-11-17 NOTE — Progress Notes (Signed)
I have seen and examined the patient and agree with the assessment and plans.  Marsa Matteo A. Tahliyah Anagnos  MD, FACS  

## 2012-11-17 NOTE — Progress Notes (Signed)
Patient refused cpap, RN aware.

## 2012-11-18 ENCOUNTER — Telehealth (INDEPENDENT_AMBULATORY_CARE_PROVIDER_SITE_OTHER): Payer: Self-pay | Admitting: *Deleted

## 2012-11-18 ENCOUNTER — Other Ambulatory Visit (INDEPENDENT_AMBULATORY_CARE_PROVIDER_SITE_OTHER): Payer: Self-pay | Admitting: Surgery

## 2012-11-18 DIAGNOSIS — Z5189 Encounter for other specified aftercare: Secondary | ICD-10-CM

## 2012-11-18 NOTE — Telephone Encounter (Signed)
Order was faxed to [facility] [phone] to request their services per patients wife.  Patient requires Wet-dry dressings BID per Cornett MD

## 2012-11-18 NOTE — Telephone Encounter (Signed)
Wife called concerned that patient has an open wound with gauze over it on his abdomen but she is unsure how to manage this wound site.  Note on discharge summary states that patient and wife declined home health.  This RN spoke to [employer] MD who suggested wet-dry dressing changes BID either by patient/wife or by setting up home health.

## 2012-11-18 NOTE — Telephone Encounter (Signed)
Call wife back to discuss wound care.  Wife states she would prefer for [place] to set up home health for the wound changes bc it sounds like too much for her to handle.

## 2012-11-19 ENCOUNTER — Telehealth (INDEPENDENT_AMBULATORY_CARE_PROVIDER_SITE_OTHER): Payer: Self-pay | Admitting: General Surgery

## 2012-11-19 NOTE — Telephone Encounter (Signed)
Betsy with [facility] called to state that at this time they are not accepting [employer].  Marcelino Duster updated and will work on referring elsewhere.

## 2012-11-19 NOTE — Telephone Encounter (Signed)
Spoke with Harriett Sine [facility] will be starting care tomorrow with this patient records were faxed to [phone]

## 2012-12-02 ENCOUNTER — Encounter (INDEPENDENT_AMBULATORY_CARE_PROVIDER_SITE_OTHER): Payer: Self-pay | Admitting: Surgery

## 2012-12-02 ENCOUNTER — Ambulatory Visit (INDEPENDENT_AMBULATORY_CARE_PROVIDER_SITE_OTHER): Payer: BC Managed Care – PPO | Admitting: Surgery

## 2012-12-02 VITALS — BP 110/72 | HR 95 | Temp 97.7°F | Resp 18 | Ht 74.0 in | Wt 192.8 lb

## 2012-12-02 DIAGNOSIS — Z9889 Other specified postprocedural states: Secondary | ICD-10-CM

## 2012-12-02 NOTE — Progress Notes (Signed)
Patient returns after emergent left hemicolectomy for ischemic colitis almost one month ago. He is doing well.  Exam: Midline wound is almost healed. Small open areas are clean. Colostomy is pink and viable and functioning.  Impression: Status post ischemic colitis and resection of left colon and colostomy  Plan: Return to clinic one month. We'll need colonoscopy prior to reversal.

## 2012-12-02 NOTE — Patient Instructions (Signed)
Return 1 month

## 2012-12-17 ENCOUNTER — Encounter (INDEPENDENT_AMBULATORY_CARE_PROVIDER_SITE_OTHER): Payer: Self-pay | Admitting: Surgery

## 2012-12-18 ENCOUNTER — Telehealth (INDEPENDENT_AMBULATORY_CARE_PROVIDER_SITE_OTHER): Payer: Self-pay

## 2012-12-18 NOTE — Telephone Encounter (Signed)
LMOM to call back. I want to move his follow up appointment up to tomorrow 3/21 @ 10:10 to have wound checked since he is having redness and swelling at stitch site.

## 2012-12-19 ENCOUNTER — Telehealth (INDEPENDENT_AMBULATORY_CARE_PROVIDER_SITE_OTHER): Payer: Self-pay | Admitting: General Surgery

## 2012-12-19 ENCOUNTER — Encounter (INDEPENDENT_AMBULATORY_CARE_PROVIDER_SITE_OTHER): Payer: Self-pay | Admitting: Surgery

## 2012-12-19 ENCOUNTER — Ambulatory Visit (INDEPENDENT_AMBULATORY_CARE_PROVIDER_SITE_OTHER): Payer: BC Managed Care – PPO | Admitting: Surgery

## 2012-12-19 VITALS — BP 104/64 | HR 67 | Temp 97.8°F | Resp 18 | Ht 74.0 in | Wt 191.0 lb

## 2012-12-19 DIAGNOSIS — K559 Vascular disorder of intestine, unspecified: Secondary | ICD-10-CM

## 2012-12-19 NOTE — Patient Instructions (Signed)
CANCELL NEXT APPT.  RETURN 6 WEEKS

## 2012-12-19 NOTE — Progress Notes (Signed)
Patient returns after descending colectomy for ischemic colitis with colostomy formation. He is here for wound check. He is 2 areas one on his wound one is colostomy was to look at. He otherwise is doing okay.  Exam: Colostomy examined. There is some hyper granulation tissue noted. Ostomy functioning well.  Wound is examined. Suture trimmed. Silver nitrate added to granulation tissue. Otherwise clean dry and intact.  Impression: Status post emergent left hemicolectomy for ischemic colitis with colostomy  Plan: Will need colonoscopy prior to closure. Return 6 weeks.

## 2012-12-19 NOTE — Telephone Encounter (Signed)
Spoke with patient's wife Lona she is aware of appt 12/30/12 at 1pm  With Dr Madilyn Fireman

## 2013-01-02 ENCOUNTER — Encounter (INDEPENDENT_AMBULATORY_CARE_PROVIDER_SITE_OTHER): Payer: BC Managed Care – PPO | Admitting: Surgery

## 2013-01-23 ENCOUNTER — Encounter (INDEPENDENT_AMBULATORY_CARE_PROVIDER_SITE_OTHER): Payer: Self-pay

## 2013-01-26 ENCOUNTER — Encounter (INDEPENDENT_AMBULATORY_CARE_PROVIDER_SITE_OTHER): Payer: Self-pay | Admitting: Surgery

## 2013-01-26 ENCOUNTER — Ambulatory Visit (INDEPENDENT_AMBULATORY_CARE_PROVIDER_SITE_OTHER): Payer: BC Managed Care – PPO | Admitting: Surgery

## 2013-01-26 VITALS — BP 168/70 | HR 63 | Temp 97.4°F | Resp 18 | Ht 74.0 in | Wt 192.4 lb

## 2013-01-26 DIAGNOSIS — Z9889 Other specified postprocedural states: Secondary | ICD-10-CM

## 2013-01-26 NOTE — Progress Notes (Signed)
Patient ID: James [name], male   DOB: 1951-04-26, 62 y.o.   MRN: [id number]  Chief Complaint  Patient presents with  . Routine Post Op    reck wound    HPI James [name] is a 62 y.o. male.  Patient returns in followup for ischemic colitis. He a colonoscopy and this was normal both per colostomy and per. He has no complaints HPI  Past Medical History  Diagnosis Date  . Cancer of lung     s/p resection in 2005 at [facility] / [place] patient  . COPD (chronic obstructive pulmonary disease)   . Hypertension   . High cholesterol   . Aorta aneurysm   . Exposure to Agent Orange     [place] War    Past Surgical History  Procedure Laterality Date  . Lung surgery    . Laparotomy N/A 11/08/2012    Procedure: EXPLORATORY LAPAROTOMY with descending and sigmoid colectomy; wound vac placement and colostomy;  Surgeon: Clovis Pu. Jemiah Cuadra, MD;  Location: [facility] OR;  Service: General;  Laterality: N/A;  . Femoral artery stent      Family History  Problem Relation Age of Onset  . Heart attack Mother   . Tuberculosis Mother   . Heart attack Father   . Stroke Father   . Heart attack Brother   . Heart disease Sister   . Cancer Brother     Prostate    Social History History  Substance Use Topics  . Smoking status: Former Smoker    Types: Cigarettes    Quit date: 10/02/2003  . Smokeless tobacco: Current User    Types: Chew  . Alcohol Use: No    Allergies  Allergen Reactions  . Amlodipine Besy-Benazepril Hcl     REACTION: sob  . Meloxicam     REACTION: stomach upset  . Penicillins     REACTION: Rash    Current Outpatient Prescriptions  Medication Sig Dispense Refill  . albuterol (PROVENTIL HFA;VENTOLIN HFA) 108 (90 BASE) MCG/ACT inhaler Inhale 2 puffs into the lungs every 6 (six) hours as needed for wheezing. For shortness of breath      . aspirin 81 MG tablet Take 81 mg by mouth daily.      . budesonide-formoterol (SYMBICORT) 160-4.5 MCG/ACT inhaler Inhale 2 puffs into the lungs 2 (two) times  daily.      Marland Kitchen diltiazem (TIAZAC) 240 MG 24 hr capsule Take 240 mg by mouth 2 (two) times daily.      . finasteride (PROSCAR) 5 MG tablet Take 5 mg by mouth daily.      . fish oil-omega-3 fatty acids 1000 MG capsule Take 2 g by mouth 2 (two) times daily.      Marland Kitchen gemfibrozil (LOPID) 600 MG tablet Take 600 mg by mouth 2 (two) times daily before a meal.      . HYDROcodone-acetaminophen (LORTAB) 10-500 MG per tablet Take 1 tablet by mouth 4 (four) times daily as needed for pain. For pain      . lisinopril-hydrochlorothiazide (PRINZIDE,ZESTORETIC) 20-12.5 MG per tablet       . omeprazole (PRILOSEC OTC) 20 MG tablet Take 20 mg by mouth 2 (two) times daily.      . pravastatin (PRAVACHOL) 40 MG tablet       . tiotropium (SPIRIVA) 18 MCG inhalation capsule Place 18 mcg into inhaler and inhale daily.       No current facility-administered medications for this visit.    Review of Systems Review of Systems  Constitutional:  Negative.   HENT: Negative.   Eyes: Negative.   Respiratory: Negative.   Cardiovascular: Negative.   Gastrointestinal: Negative.   Genitourinary: Negative.   Allergic/Immunologic: Negative.   Hematological: Negative.   Psychiatric/Behavioral: Negative.  Negative for agitation.    Blood pressure 168/70, pulse 63, temperature 97.4 F (36.3 C), temperature source Temporal, resp. rate 18, height 6\' 2"  (1.88 m), weight 192 lb 6.4 oz (87.272 kg).  Physical Exam Physical Exam  Constitutional: He appears well-developed and well-nourished.  HENT:  Head: Normocephalic and atraumatic.  Eyes: EOM are normal. Pupils are equal, round, and reactive to light.  Neck: Normal range of motion.  Cardiovascular: Normal rate and regular rhythm.   Pulmonary/Chest: Effort normal and breath sounds normal.  Abdominal:      Data Reviewed Colonoscopy at [facility]  Normal   Assessment    History of ischemic colitis status post resection of splenic flexure and descending colon with  colostomy  Patient Active Problem List  Diagnosis  . HYPERLIPIDEMIA  . HYPERTENSION  . PERIPHERAL VASCULAR DISEASE  . COPD  . GERD  . BENIGN PROSTATIC HYPERTROPHY  . HERNIATED LUMBAR DISC  . LOW BACK PAIN  . LUNG CANCER, HX OF  . DIVERTICULITIS, HX OF  . Hypovolemic shock  . Abdominal pain  . Emesis  . Diarrhea  . Swollen upper lip  . Paroxysmal a-fib, spont conversion to NSR  . History of hypertension  . COPD (chronic obstructive pulmonary disease)  . Hyperlipidemia  . Hyperglycemia, stress related  . Elevated alkaline phosphatase level  . SIRS criteria on admission (leukocytosis, hypotension, hypothermia)  . Enteritis  . Ischemic colitis  . Post-operative state      Plan    Colostomy reversal. He may require a completion colectomy with an ileocolic anastomosis versus a colocolonic anastomosis depending upon anatomy. I have discussed both with the patient and his wife today. I discussed the implications of each from the standpoint of bowel movements and quality of life issues after surgery. I discussed risk of surgery to include but not exclusive of bleeding, infection, anastomotic breakdown, death, exacerbation of underlying medical problems, pulmonary failure, renal failure, the need for other operations, hernia formation, wound complications, DVT and pulmonary embolus. They understand and wish to proceed. Questions are answered.       Catrice Zuleta A. 01/26/2013, 10:24 AM

## 2013-01-26 NOTE — Patient Instructions (Signed)
End Colostomy Reversal An end colostomy reversal is surgery that reverses an end colostomy. The large intestine is disconnected from the opening in the abdomen (stoma). It is then reconnected to the large intestine inside the body. A stoma and pouch are no longer needed. Bowel movements can resume through the rectum. LET YOUR CAREGIVER KNOW ABOUT:  Allergies to food or medicine.  Medicines taken, including vitamins, health supplements, herbs, eyedrops, over-the-counter medicines, and creams.  Use of steroids (by mouth or creams).  Previous problems with anesthetics or numbing medicines.  History of bleeding problems or blood clots.  Previous surgery.  Other health problems, including diabetes and kidney problems.  Possibility of pregnancy, if this applies. RISKS AND COMPLICATIONS General surgical complications may include:  Reaction to anesthesia.  Damage to surrounding nerves, tissues, or structures.  Blood clot.  Bleeding.  Scarring. Specific risks for colostomy reversal, while rare, may include:  Intestinal paralysis (ileus). This is a normal part of recovery. It usually goes away in 3 to 7 days. However, it can last longer in some people.  Leaking at the joined part of the intestine (anastamotic leak).  Infection of the surgical cut (incision) or the place where the stoma was located.  A collection of pus (abscess) in the abdomen or pelvis.  Intestinal blockage.  Narrowing at the joined part of the intestine (stricture).  Urinary and sexual dysfunction. BEFORE THE PROCEDURE It is important to follow your surgeon's instructions prior to your procedure. This will help you to avoid complications. Steps before your procedure may include:  A physical exam, rectal exam, X-rays, colonoscopy, and other procedures.  Chemotherapy or radiation therapy if the stoma was created for cancer.  A review of the procedure, the anesthesia being used, and what to expect after the  procedure. You may be asked to:  Stop taking certain medicines for several days prior to your procedure. This may include blood thinners (such as aspirin).  Take certain medicines, such as antibiotics or stool softeners.  Avoid eating and drinking after midnight the night before the procedure. This will help you to avoid complications from the anesthesia.  Quit smoking. Smoking increases the chances of a healing problem after your procedure. PROCEDURE  You will be given medicine that makes you sleep (general anesthetic). The procedure may be done as open surgery, with a large incision. It may also be done as laparoscopic surgery, with several smaller incisions. The surgeon will stitch or staple the intestine ends back together. This surgery takes several hours. AFTER THE PROCEDURE  You will be given pain medicine.  Your caregivers will slowly increase your diet and movement.  You can expect to be in the hospital for about 5 to 10 days.  You should arrange for someone to help you with activities at home while you recover. Document Released: 12/10/2011 Document Reviewed: 12/10/2011 Exit[employer] Patient Information 2013 [employer].  

## 2013-02-03 ENCOUNTER — Encounter (HOSPITAL_COMMUNITY): Payer: Self-pay | Admitting: Pharmacy Technician

## 2013-02-11 ENCOUNTER — Ambulatory Visit (HOSPITAL_COMMUNITY)
Admission: RE | Admit: 2013-02-11 | Discharge: 2013-02-11 | Disposition: A | Discharge status: Home / Self Care | Payer: BC Managed Care – PPO | Source: Ambulatory Visit | Attending: Surgery | Admitting: Surgery

## 2013-02-11 ENCOUNTER — Encounter (HOSPITAL_COMMUNITY): Payer: Self-pay

## 2013-02-11 ENCOUNTER — Encounter (HOSPITAL_COMMUNITY)
Admission: RE | Admit: 2013-02-11 | Discharge: 2013-02-11 | Disposition: A | Discharge status: Home / Self Care | Payer: BC Managed Care – PPO | Source: Ambulatory Visit | Attending: Surgery | Admitting: Surgery

## 2013-02-11 DIAGNOSIS — Z85118 Personal history of other malignant neoplasm of bronchus and lung: Secondary | ICD-10-CM | POA: Insufficient documentation

## 2013-02-11 DIAGNOSIS — Z0183 Encounter for blood typing: Secondary | ICD-10-CM | POA: Insufficient documentation

## 2013-02-11 DIAGNOSIS — Z01812 Encounter for preprocedural laboratory examination: Secondary | ICD-10-CM | POA: Insufficient documentation

## 2013-02-11 DIAGNOSIS — Z01818 Encounter for other preprocedural examination: Secondary | ICD-10-CM | POA: Insufficient documentation

## 2013-02-11 DIAGNOSIS — Z933 Colostomy status: Secondary | ICD-10-CM | POA: Insufficient documentation

## 2013-02-11 DIAGNOSIS — I10 Essential (primary) hypertension: Secondary | ICD-10-CM | POA: Insufficient documentation

## 2013-02-11 HISTORY — DX: Noninfective gastroenteritis and colitis, unspecified: K52.9

## 2013-02-11 HISTORY — DX: Benign prostatic hyperplasia without lower urinary tract symptoms: N40.0

## 2013-02-11 HISTORY — DX: Gastro-esophageal reflux disease without esophagitis: K21.9

## 2013-02-11 LAB — CBC WITH DIFFERENTIAL/PLATELET
Basophils Absolute: 0.1 10*3/uL (ref 0.0–0.1)
Basophils Relative: 1 % (ref 0–1)
Eosinophils Absolute: 0.3 10*3/uL (ref 0.0–0.7)
Eosinophils Relative: 3 % (ref 0–5)
HCT: 37.8 % — ABNORMAL LOW (ref 39.0–52.0)
Hemoglobin: 11.9 g/dL — ABNORMAL LOW (ref 13.0–17.0)
Lymphocytes Relative: 26 % (ref 12–46)
Lymphs Abs: 2.6 10*3/uL (ref 0.7–4.0)
MCH: 26.4 pg (ref 26.0–34.0)
MCHC: 31.5 g/dL (ref 30.0–36.0)
MCV: 84 fL (ref 78.0–100.0)
Monocytes Absolute: 0.7 10*3/uL (ref 0.1–1.0)
Monocytes Relative: 7 % (ref 3–12)
Neutro Abs: 6.2 10*3/uL (ref 1.7–7.7)
Neutrophils Relative %: 63 % (ref 43–77)
Platelets: 371 10*3/uL (ref 150–400)
RBC: 4.5 MIL/uL (ref 4.22–5.81)
RDW: 13.9 % (ref 11.5–15.5)
WBC: 9.9 10*3/uL (ref 4.0–10.5)

## 2013-02-11 LAB — COMPREHENSIVE METABOLIC PANEL
ALT: 13 U/L (ref 0–53)
AST: 16 U/L (ref 0–37)
Albumin: 3.8 g/dL (ref 3.5–5.2)
Alkaline Phosphatase: 118 U/L — ABNORMAL HIGH (ref 39–117)
Chloride: 103 mEq/L (ref 96–112)
Potassium: 4.4 mEq/L (ref 3.5–5.1)
Sodium: 141 mEq/L (ref 135–145)
Total Bilirubin: 0.3 mg/dL (ref 0.3–1.2)

## 2013-02-11 LAB — SURGICAL PCR SCREEN: Staphylococcus aureus: NEGATIVE

## 2013-02-11 NOTE — Patient Instructions (Signed)
ARIK HUSMANN  02/11/2013                           YOUR PROCEDURE IS SCHEDULED ON: 02/17/13               PLEASE REPORT TO SHORT STAY CENTER AT : 5:15 AM               CALL THIS NUMBER IF ANY PROBLEMS THE DAY OF SURGERY :               [phone]                      REMEMBER:   Do not eat food or drink liquids AFTER MIDNIGHT   Take these medicines the morning of surgery with A SIP OF WATER: SYMBICORT / DILTIAZEM / GEMFIBROZIL / OMEPRAZOLE / MAY TAKE LORTAB IF NEEDED / MAY USE ALBUTEROL IF NEEDED   Do not wear jewelry, make-up   Do not wear lotions, powders, or perfumes.   Do not shave legs or underarms 12 hrs. before surgery (men may shave face)  Do not bring valuables to the hospital.  Contacts, dentures or bridgework may not be worn into surgery.  Leave suitcase in the car. After surgery it may be brought to your room.  For patients admitted to the hospital more than one night, checkout time is 11:00                          The day of discharge.   Patients discharged the day of surgery will not be allowed to drive home                             If going home same day of surgery, must have someone stay with you first                           24 hrs at home and arrange for some one to drive you home from hospital.    Special Instructions:   Please read over the following fact sheets that you were given:               1. MRSA  INFORMATION                      2. [facility] PREPARING FOR SURGERY SHEET                3. BRING C-PAP MASK AND TUBING TO HOSPITAL                    BRING ALBUTEROL INHALER TO HOSPITAL                                                X_____________________________________________________________________        Failure to follow these instructions may result in cancellation of your surgery

## 2013-02-16 ENCOUNTER — Telehealth (INDEPENDENT_AMBULATORY_CARE_PROVIDER_SITE_OTHER): Payer: Self-pay

## 2013-02-16 ENCOUNTER — Telehealth (INDEPENDENT_AMBULATORY_CARE_PROVIDER_SITE_OTHER): Payer: Self-pay | Admitting: General Surgery

## 2013-02-16 DIAGNOSIS — Z933 Colostomy status: Secondary | ICD-10-CM

## 2013-02-16 NOTE — Telephone Encounter (Signed)
Spoke with patient's wife she is aware of appt  On 03/25/13 11am Dr Jens Som   [facility] office

## 2013-02-16 NOTE — Progress Notes (Signed)
Text paged Dr. Luisa Hart to review patient's EKG.

## 2013-02-16 NOTE — Telephone Encounter (Signed)
I called patient to let him know we will need to cancel his surgery and reschedule due to abnormal pre op work up. We will schedule him to see a cardiologist since EKG shows a fib.

## 2013-02-16 NOTE — Telephone Encounter (Signed)
I returned phone call to wife. I told her the hospital contacted our office today to let Dr Luisa Hart know his EKG shows A Fib and that he would need cardiac clearance. I told her we got him the soonest appointment available with cardiology and that we would reschedule him as soon as we got clearance. She hates that this was just noticed and feels like he has slipped through the cracks. I told her I understood her concerns and feelings and our hands were tied at this point. She understood and they will stay in contact through this process.

## 2013-02-16 NOTE — Telephone Encounter (Signed)
Please call patient (wife ) called  to let her know once he is schedule for his cardiac clearance. She is upset that just now that they stated that he has a fib and no one has told them about this, stated that he husband was in the hospital couple weeks ago and had another EKG and they told them that his EKG was ok, and I told her that I did not know that they just called our office to let Dr Luisa Hart know that he have to have cardiac clearance

## 2013-02-17 ENCOUNTER — Encounter (HOSPITAL_COMMUNITY): Admission: RE | Payer: Self-pay | Source: Ambulatory Visit

## 2013-02-17 ENCOUNTER — Ambulatory Visit (HOSPITAL_COMMUNITY): Admission: RE | Admit: 2013-02-17 | Payer: BC Managed Care – PPO | Source: Ambulatory Visit | Admitting: Surgery

## 2013-02-17 LAB — TYPE AND SCREEN
ABO/RH(D): A POS
Antibody Screen: NEGATIVE

## 2013-02-17 SURGERY — COLOSTOMY CLOSURE
Anesthesia: General

## 2013-02-19 ENCOUNTER — Telehealth (INDEPENDENT_AMBULATORY_CARE_PROVIDER_SITE_OTHER): Payer: Self-pay | Admitting: General Surgery

## 2013-02-19 NOTE — Telephone Encounter (Signed)
Patient's wife calling because they spoke to their PCP at the [facility] and they were questioning whether patient needed to see a cardiologist or if he could manage his abnormal EKG. They were asking for records to be faxed to the PCP for evaluation. The wife would like to go ahead and keep appt that has been made for them with cardiology. I faxed patient's EKG and discharge summary to [facility] attn: PACTS per wife's request at [phone]. Confirmation received.

## 2013-03-09 ENCOUNTER — Encounter (INDEPENDENT_AMBULATORY_CARE_PROVIDER_SITE_OTHER): Payer: BC Managed Care – PPO | Admitting: Surgery

## 2013-03-20 ENCOUNTER — Encounter (INDEPENDENT_AMBULATORY_CARE_PROVIDER_SITE_OTHER): Payer: Self-pay | Admitting: Surgery

## 2013-03-23 ENCOUNTER — Telehealth (INDEPENDENT_AMBULATORY_CARE_PROVIDER_SITE_OTHER): Payer: Self-pay | Admitting: General Surgery

## 2013-03-23 NOTE — Telephone Encounter (Signed)
Patient called to see if we had received Records from [place] in [place]. I retrieved them from the Fax basket and placed in Cornett,MD box. Patient ask if Cornett,MD reviewed these records and they were sufficient, could he cancel his appointment with Dr.Crenshaw on 03/25/2013. I advised patient I would inform Cornett,MD Assistant and they would call him back with a final answer.

## 2013-03-24 ENCOUNTER — Encounter (INDEPENDENT_AMBULATORY_CARE_PROVIDER_SITE_OTHER): Payer: Self-pay

## 2013-03-24 NOTE — Telephone Encounter (Signed)
Let patient know I received cardiac clearance records. Told him we will get his colostomy takedown surgery rescheduled when Dr Luisa Hart is available.

## 2013-03-25 ENCOUNTER — Institutional Professional Consult (permissible substitution): Payer: BC Managed Care – PPO | Admitting: Cardiology

## 2013-03-28 ENCOUNTER — Encounter (INDEPENDENT_AMBULATORY_CARE_PROVIDER_SITE_OTHER): Payer: Self-pay | Admitting: Surgery

## 2013-03-30 ENCOUNTER — Telehealth (INDEPENDENT_AMBULATORY_CARE_PROVIDER_SITE_OTHER): Payer: Self-pay

## 2013-03-30 NOTE — Telephone Encounter (Signed)
I called [place] and let him know Dr Luisa Hart reviewed his notes from the [place] and he still wants him to see a cardiologist in town. i set him an appointment up for tomorrow with Dr Herbie Baltimore. I also let patient know depending on clearance he may be scheduled for mid to late July.

## 2013-03-31 ENCOUNTER — Ambulatory Visit (INDEPENDENT_AMBULATORY_CARE_PROVIDER_SITE_OTHER): Payer: BC Managed Care – PPO | Admitting: Cardiology

## 2013-03-31 ENCOUNTER — Encounter: Payer: Self-pay | Admitting: Cardiology

## 2013-03-31 VITALS — BP 130/60 | HR 76 | Ht 74.0 in | Wt 202.4 lb

## 2013-03-31 DIAGNOSIS — I10 Essential (primary) hypertension: Secondary | ICD-10-CM

## 2013-03-31 DIAGNOSIS — I4891 Unspecified atrial fibrillation: Secondary | ICD-10-CM

## 2013-03-31 DIAGNOSIS — J449 Chronic obstructive pulmonary disease, unspecified: Secondary | ICD-10-CM

## 2013-03-31 DIAGNOSIS — I480 Paroxysmal atrial fibrillation: Secondary | ICD-10-CM

## 2013-03-31 DIAGNOSIS — I499 Cardiac arrhythmia, unspecified: Secondary | ICD-10-CM

## 2013-03-31 DIAGNOSIS — E785 Hyperlipidemia, unspecified: Secondary | ICD-10-CM

## 2013-03-31 DIAGNOSIS — Z01810 Encounter for preprocedural cardiovascular examination: Secondary | ICD-10-CM

## 2013-03-31 MED ORDER — METOPROLOL TARTRATE 25 MG PO TABS: 25.0000 mg | ORAL_TABLET | Freq: Two times a day (BID) | ORAL | Status: DC

## 2013-03-31 NOTE — Patient Instructions (Addendum)
Dr Herbie Baltimore has ordered Metoprolol 25 mg. Please take this twice daily, beginning 2 weeks prior to your surgery.  If while taking this, your heart rate gets too low, take Diltiazem only once a day (at night).   Dr Herbie Baltimore will see you back after your surgery.   A cardiac clearance letter will be available for you to pick up. We will call you when it is ready.

## 2013-04-04 ENCOUNTER — Encounter: Payer: Self-pay | Admitting: Cardiology

## 2013-04-04 DIAGNOSIS — I499 Cardiac arrhythmia, unspecified: Secondary | ICD-10-CM | POA: Insufficient documentation

## 2013-04-04 DIAGNOSIS — R0602 Shortness of breath: Secondary | ICD-10-CM | POA: Insufficient documentation

## 2013-04-04 NOTE — Progress Notes (Signed)
Patient ID: James [name], male   DOB: 11-Jan-1951, 62 y.o.   MRN: [id number]  Clinic Note: HPI: James [name] is a 62 y.o. male with a PMH below who presents today for preoperative cardiovascular evaluation after an episode of postoperative atrial fibrillation during his last operation. Mr. Chain had an unfortunate episode back in February when he presented with what appeared to be ischemic colitis and. This was treated with emergent partial colectomy on February 8 by Dr. Luisa Hart with colostomy placement. He is now due for colostomy takedown and reattachment. During his postop recovery timeframe he is compensated by having what appeared to be systemic inflammatory response possible sepsis with hypotension. He was quite sick for some time, and was in the hospital for 10 days. Does report a history of vascular disease although I don't have any data to support this. It is not clear when in the course of events that he had an episode of atrial fibrillation with rapid ventricular rate. This spontaneously converted and he was discharged on diltiazem. The note on discharge to states that he had an episode while at [facility] prior to transport and again postop day 1. He was evaluated with an echocardiogram that suggested some right ventricular overload and both alignment pressure.    It would appear that his episodes of atrial fibrillation morning were during a time when he was extremely sick hypovolemic, septic shock. He had never had a history of this in the past as far as any prior documentation notes. This also course was also given affected he has long-standing COPD (and a history of lung cancer status post resection) .  He actually up early was undergoing evaluation for his colostomy takedown and this was stopped due to conserve from anesthesiology that his history of atrial fibrillation postoperatively. So he is here for cardiac evaluation.  Interval History:  since his discharge he is  doing pretty well recovering slowly. He does not recall having any further episodes of 8 palpitations or rapid heart rates. No lightheadedness, dizziness or syncope/near-syncope. No TIA or amaurosis fugax symptoms. No chest pain at rest or exertion. Does have some baseline dyspnea made worse with exertion from his long-standing COPD. Denies any melena, hematochezia hematuria.   Past Medical History  Diagnosis Date  . Cancer of lung     s/p resection in 2005 at [facility] / [place] patient  . COPD (chronic obstructive pulmonary disease)   . Hypertension   . High cholesterol   . Aorta aneurysm   . Exposure to Agent Orange     [place] War  . Shortness of breath   . Colitis   . 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 GERD (gastroesophageal reflux disease)   . BPH (benign prostatic hypertrophy)   . Sleep apnea     c-pap    Prior Cardiac Evaluation and Past Surgical History: Past Surgical History  Procedure Laterality Date  . Lung surgery    . Laparotomy N/A 11/08/2012    Procedure: EXPLORATORY LAPAROTOMY with descending and sigmoid colectomy; wound vac placement and colostomy;  Surgeon: Clovis Pu. Cornett, MD;  Location: [facility] OR;  Service: General;  Laterality: N/A;  . Femoral artery stent    . Colon surgery      Allergies  Allergen Reactions  . Amlodipine Besy-Benazepril Hcl     REACTION: sob  . Meloxicam     REACTION: stomach upset  . Penicillins     REACTION: Rash    Current Outpatient Prescriptions  Medication Sig Dispense Refill  . albuterol (PROVENTIL HFA;VENTOLIN HFA) 108 (90 BASE) MCG/ACT inhaler Inhale 2 puffs into the lungs every 6 (six) hours as needed for wheezing. For shortness of breath      . aspirin 81 MG tablet Take 81 mg by mouth daily.      . budesonide-formoterol (SYMBICORT) 160-4.5 MCG/ACT inhaler Inhale 2 puffs into the lungs 2 (two) times daily.      Marland Kitchen diltiazem (TIAZAC) 240 MG 24 hr capsule Take 240 mg by mouth 2 (two) times daily.      . finasteride (PROSCAR) 5 MG tablet Take  5 mg by mouth daily.      . fish oil-omega-3 fatty acids 1000 MG capsule Take 2 g by mouth 2 (two) times daily.      Marland Kitchen HYDROcodone-acetaminophen (NORCO) 10-325 MG per tablet Take 1 tablet by mouth every 6 (six) hours as needed for pain.      . Ibuprofen-Diphenhydramine HCl (ADVIL PM) 200-25 MG CAPS Take 1 tablet by mouth at bedtime.      Marland Kitchen lisinopril-hydrochlorothiazide (PRINZIDE,ZESTORETIC) 20-12.5 MG per tablet Take 1 tablet by mouth daily.      Marland Kitchen omeprazole (PRILOSEC OTC) 20 MG tablet Take 20 mg by mouth 2 (two) times daily.      . pravastatin (PRAVACHOL) 40 MG tablet Take 20 mg by mouth at bedtime.       Marland Kitchen tiotropium (SPIRIVA) 18 MCG inhalation capsule Place 18 mcg into inhaler and inhale daily.      . traMADol (ULTRAM) 50 MG tablet Take 50 mg by mouth as needed.      . metoprolol tartrate (LOPRESSOR) 25 MG tablet Take 1 tablet (25 mg total) by mouth 2 (two) times daily.  60 tablet  1   No current facility-administered medications for this visit.  metoprolol added this visit.   History   Social History  . Marital Status: Married    Spouse Name: N/A    Number of Children: N/A  . Years of Education: N/A   Occupational History  . Not on file.   Social History Main Topics  . Smoking status: Former Smoker    Types: Cigarettes    Quit date: 10/02/2003  . Smokeless tobacco: Current User    Types: Chew  . Alcohol Use: No  . Drug Use: No  . Sexually Active: Not on file   Other Topics Concern  . Not on file   Social History Narrative  . No narrative on file   Family History  Problem Relation Age of Onset  . Heart attack Mother   . Tuberculosis Mother   . Heart attack Father   . Stroke Father   . Heart attack Brother   . Heart disease Sister   . Cancer Brother     Prostate    ROS: A comprehensive Review of Systems - Negative except Mild fatigue and weakness in sentences hospitalization but otherwise recovering quite well. His baseline shortness of breath that is no  different from anything he said in the past.  PHYSICAL EXAM BP 130/60  Pulse 76  Ht 6\' 2"  (1.88 m)  Wt 202 lb 6.4 oz (91.808 kg)  BMI 25.98 kg/m2 General appearance: alert, cooperative, no distress and He does appear somewhat chronically ill but otherwise well-nourished and well-groomed. Very pleasant mood and affect. Answers questions appropriately. Neck: no adenopathy, no carotid bruit, no JVD and supple, symmetrical, trachea midline Lungs: rhonchi Diffuse/tubular sounds, wheezes Diffuse late expiratory  and No increased worker  breathing, otherwise clear Heart: regular rate and rhythm, S1, S2 normal, no murmur, click, rub or gallop and normal apical impulse Abdomen: Soft, nontender, nondistended, normoactive bowel sounds. His ostomy was dressed. Extremities: extremities normal, atraumatic, no cyanosis or edema, no edema, redness or tenderness in the calves or thighs and no ulcers, gangrene or trophic changes Pulses: 2+ and symmetric Skin: Skin color, texture, turgor normal. No rashes or lesions Neurologic: Grossly normal HEENT: /AT, EOMI, MMM, anicteric sclera  WUJ:WJXBJYNWG today: Yes Rate: 76 , Rhythm:  normal sinus;  otherwise normal ECG  ASSESSMENT: Mr. Luiz Blare is relatively stable from cardiac standpoint. I think his episode of atrial fibrillation postoperatively and in this episode of ischemic colitis is really not a major concern. He certainly would be at risk for atrial fibrillation postoperatively again. That in itself is not overly concerning. He has not had any symptoms of angina or heart failure. His baseline dyspnea and while he and he has cardiac risk factors, he does not have insulin-dependent diabetes, cerebrovascular disease, known existing coronary artery disease or any coronary disease symptoms, no heart failure symptoms and no baseline renal insufficiency with creatinine greater than 2. That being said his surgery is not high-risk vascular, but is intermediate risk due  to being intra-peritoneal. For cardiac standpoint, despite him having shock and hypovolemia possible sepsis he did not have anything untoward happens other than being in atrial fibrillation. He is much more stable from a general health perspective this time around. He does have relatively normal ejection fraction by echocardiogram. He did have some right-sided enlargement which would go along with his long-standing lung disease and lung cancer. There may been some mild component of cor pulmonale. This is probably will set him up for having H. fibrillation. While in the future I would like to evaluate him for possible ischemic heart disease, I don't think that any thoughts of revascularization at this time would be prudent preoperatively, as he does not seem to have any cardiac symptoms.  Pre-operative cardiovascular examination, supraventricular arrhythmia  Paroxysmal a-fib, spont conversion to NSR  COPD (chronic obstructive pulmonary disease)  HYPERLIPIDEMIA  HYPERTENSION  PREOPERATIVE CARDIAC RISK ASSESSMENT   Revised Cardiac Risk Index:  High Risk Surgery: Yes;   Defined as Intraperitoneal, intrathoracic or suprainguinal vascular  Active CAD: no; no symptoms of angina at rest or exertion  CHF: no; no PND, orthopnea or edema; simply his baseline dyspnea due to COPD  Cerebrovascular Disease: no;   Diabetes: no; On Insulin: N/A  CKD (Cr >~ 2): no  Total: 1 Estimated Risk of Adverse Outcome: Low risk  Estimated Risk of MI, PE, VF/VT (Cardiac Arrest), Complete Heart Block: ~0.9 % -- reduced by half with standing preoperative and perioperative beta blocker.   ACC/AHA Guidelines for "Clearance":  Step 1 - Need for Emergency Surgery: No: Not emergent  If Yes - go straight to OR with perioperative surveillance  Step 2 - Active Cardiac Conditions (Unstable Angina, Decompensated HF, Significant  Arrhytmias - Complete HB, Mobitz II, Symptomatic VT or SVT, Severe Aortic Stenosis -  mean gradient > 40 mmHg, Valve area < 1.0 cm2):   No:   If Yes - Evaluate & Treat per ACC/AHA Guidelines  Step 3 -  Low Risk Surgery: No:   If Yes --> proceed to OR  If No --> Step 4  Step 4 - Functional Capacity >= 4 METS without symptoms: Yes  If Yes --> proceed to OR  If No --> Step 5  Step 5 --  Clinical Risk Factors (CRF) as noted above.  3 or more: No:   If Yes -- assess Surgical Risk, --   (High Risk Non-cardiac), Intraabdominal or thoracic vascular surgery consider testing if it will change management.  Intermediate Risk: Proceed to OR with HR control, or consider testing if it will change management  1-2 or more CRFs: Yes  If Yes -- assess Surgical Risk, --   (High Risk Non-cardiac), Intraabdominal or thoracic vascular surgery --> Proceed to OR, or consider testing if it will change management.  **Preoperative noninvasive stress testing would be unlikely to change my current management, as dated with support medical therapy for coronary disease in the preoperative state as opposed to revascularization unless ongoing active cardiac symptoms occur.   PLAN: Initiate low-dose beta 1 selective beta blocker, and continue perioperatively, IV if necessary. Preoperative beta blockade is more effective for the treatment of postop atrial fibrillation than Calcitrel blockers. Beta blockers also protect against the adrenergic surge as a postoperative discomfort, thereby producing a risk of preoperative cardiac events. Begin beta blocker 2 weeks prior to surgery. He was instructed to reduce diltiazem to once daily if his heart rate gets too low.   [facility] we'll be standing by for perioperative cardiac care if needed.  Meds ordered this encounter  Medications  . metoprolol tartrate (LOPRESSOR) 25 MG tablet    Sig: Take 1 tablet (25 mg total) by mouth 2 (two) times daily.    Dispense:  60 tablet    Refill:  1    Followup: One to 2 months,  postoperatively.  Marykay Lex, M.D., M.S. [facility] [address] [place], [place]  [address]  [phone] Pager # [phone] 03/31/2013 12:13 AM

## 2013-04-13 ENCOUNTER — Other Ambulatory Visit (INDEPENDENT_AMBULATORY_CARE_PROVIDER_SITE_OTHER): Payer: Self-pay | Admitting: Surgery

## 2013-04-20 ENCOUNTER — Encounter (INDEPENDENT_AMBULATORY_CARE_PROVIDER_SITE_OTHER): Payer: Self-pay

## 2013-04-20 ENCOUNTER — Telehealth (INDEPENDENT_AMBULATORY_CARE_PROVIDER_SITE_OTHER): Payer: Self-pay

## 2013-04-20 NOTE — Telephone Encounter (Signed)
Called patient and let him know the doctor appt he is referring to was the one we cancelled because we got him a sooner appt for clearance with Dr Herbie Baltimore.

## 2013-04-20 NOTE — Telephone Encounter (Signed)
Message copied by Brennan Bailey on Mon Apr 20, 2013  4:59 PM ------      Message from: [facility]      Created: Mon Apr 20, 2013  1:48 PM      Contact: [phone]       Please call him he went to the doctor we sent him to for surgery clearance but when he got there they said we canceled the appt  ------

## 2013-04-27 ENCOUNTER — Encounter (HOSPITAL_COMMUNITY): Payer: Self-pay | Admitting: Pharmacy Technician

## 2013-04-29 ENCOUNTER — Encounter (HOSPITAL_COMMUNITY)
Admission: RE | Admit: 2013-04-29 | Discharge: 2013-04-29 | Disposition: A | Discharge status: Home / Self Care | Payer: BC Managed Care – PPO | Source: Ambulatory Visit | Attending: Surgery | Admitting: Surgery

## 2013-04-29 ENCOUNTER — Encounter (HOSPITAL_COMMUNITY): Payer: Self-pay

## 2013-04-29 ENCOUNTER — Other Ambulatory Visit (INDEPENDENT_AMBULATORY_CARE_PROVIDER_SITE_OTHER): Payer: Self-pay | Admitting: Surgery

## 2013-04-29 DIAGNOSIS — Z01812 Encounter for preprocedural laboratory examination: Secondary | ICD-10-CM | POA: Insufficient documentation

## 2013-04-29 LAB — BASIC METABOLIC PANEL
BUN: 15 mg/dL (ref 6–23)
CO2: 25 mEq/L (ref 19–32)
Calcium: 9.8 mg/dL (ref 8.4–10.5)
Creatinine, Ser: 0.94 mg/dL (ref 0.50–1.35)
Glucose, Bld: 111 mg/dL — ABNORMAL HIGH (ref 70–99)
Sodium: 125 mEq/L — ABNORMAL LOW (ref 135–145)

## 2013-04-29 LAB — CBC
MCH: 27.4 pg (ref 26.0–34.0)
MCHC: 34.3 g/dL (ref 30.0–36.0)
MCV: 80 fL (ref 78.0–100.0)
Platelets: 367 10*3/uL (ref 150–400)
RBC: 4.7 MIL/uL (ref 4.22–5.81)

## 2013-04-29 NOTE — Pre-Procedure Instructions (Signed)
James [name]  04/29/2013   Your procedure is scheduled on:  May 12, 2013 at 8:00 AM  Report to [facility] at 6:00 AM.  Call this number if you have problems the morning of surgery: [phone]   Remember: Discontinue Aspirin, Coumadin, Plavix, Effient and herbal medication 7 days prior to surgery. Also stop Fish Oil, Ibuprofen 7 days prior to surgery   Do not eat food or drink liquids after midnight.   Take these medicines the morning of surgery with A SIP OF WATER: albuterol (PROVENTIL HFA;VENTOLIN HFA), diltiazem (TIAZAC), diltiazem (TIAZAC), HYDROcodone-acetaminophen (NORCO), metoprolol tartrate (LOPRESSOR), omeprazole (PRILOSEC OTC), traMADol (ULTRAM)       budesonide-formoterol (SYMBICORT)     Do not wear jewelry, make-up or nail polish.  Do not wear lotions, powders, or perfumes. You may wear deodorant.  Do not shave 48 hours prior to surgery. Men may shave face and neck.  Do not bring valuables to the hospital.  [facility] is not responsible                   for any belongings or valuables.  Contacts, dentures or bridgework may not be worn into surgery.  Leave suitcase in the car. After surgery it may be brought to your room.  For patients admitted to the hospital, checkout time is 11:00 AM the day of  discharge.     Special Instructions: Shower using CHG 2 nights before surgery and the night before surgery.  If you shower the day of surgery use CHG.  Use special wash - you have one bottle of CHG for all showers.  You should use approximately 1/3 of the bottle for each shower.   Please read over the following fact sheets that you were given: Pain Booklet, Coughing and Deep Breathing and Surgical Site Infection Prevention

## 2013-04-30 ENCOUNTER — Encounter (HOSPITAL_COMMUNITY): Payer: Self-pay

## 2013-04-30 ENCOUNTER — Telehealth (INDEPENDENT_AMBULATORY_CARE_PROVIDER_SITE_OTHER): Payer: Self-pay

## 2013-04-30 NOTE — Telephone Encounter (Signed)
Message copied by Brennan Bailey on Thu Apr 30, 2013  9:17 AM ------      Message from: Harriette Bouillon A      Created: Thu Apr 30, 2013  7:19 AM       Needs to see primary care MD for low sodium/ chloride levels and address. Found on preop labs.       ----- Message -----         From: Lab In [place] Interface         Sent: 04/29/2013   3:26 PM           To: Thomas A. Cornett, MD                   ------

## 2013-04-30 NOTE — Progress Notes (Signed)
Anesthesia Chart Review:  Patient is a 62 year old male scheduled for colostomy takedown on 05/12/13 by Dr. Luisa Hart.  He was hospitalized sepsis secondary to ischemic colitiis and required sigmoid colectomy with end colostomy on 11/08/12.  He developed post-operative afib during that admission. Other history includes lung cancer s/p resection '05 [facility]), COPD, HTN, hypercholesterolemia, former smoker, exposure to agent orange in [place], GERD, BPH, OSA with cpap, PAD s/p right CIA stent.  CTA of the abdomen/pelvis on 11/07/12 did not show any evidence of aortic aneurysm or dissection; however a 4.2 cm ascending thoracic aortic aneurysm was noted. PCP is listed as Dr. Sanda Linger.  He was seen by cardiologist Dr. Bryan Lemma on 03/31/13 for a preoperative evaluation. EKG then showed NSR. He felt further preoperative testing would unlikely change his current management, so no further testing was recommended.  He did recommended b-blocker therapy to begin two weeks prior to surgery (metoprolol was prescribed).  He is already on diltiazem with instructions to reduce his dose if any problems with bradycardia.  Echo on 11/11/12 showed: - Left ventricle: The cavity size was at the upper limits of normal. There was mild focal basal hypertrophy of the septum with minimal outflow tractgradient. Systolic function was normal. Wall motion was normal; there were no regional wall motion abnormalities. Doppler parameters are consistent with abnormal left ventricular relaxation (grade 1 diastolic dysfunction). - Ventricular septum: The contour showed diastolic flattening and systolic flattening. Suggestive of possible RV volume & pressure overload. - Right ventricle: The cavity size was mildly to moderately dilated. Wall thickness was normal. - Tricuspid valve: Trivial regurgitation. Impressions: Normal sinus rhythm was noted on this study.  CXR on 02/11/13 showed no acute cardiopulmonary abnormalities.   Preoperative  labs noted.  Na 125, Cl 87.  Glucose 111.  WBC 11.3, H/H 12.9/37.6, PLT 367. Labs have already been reviewed by Dr. Luisa Hart, and he has been instructed to follow-up with his PCP preoperatively to address hyponatremia.  He will need a repeat BMET preoperatively if no done so already.  Velna Ochs [facility]/Anesthesiology Phone [phone] 04/30/2013 1:30 PM

## 2013-04-30 NOTE — Telephone Encounter (Signed)
I called patient and let him know he needs to call PCP and have him seen for low sodium and chloride levels. Patient also states he does have bowel prep from when his surgery was previously scheduled. Advised him to follow instructions for prep.

## 2013-05-05 NOTE — Progress Notes (Addendum)
Anesthesia update:  I called [name] today for an update regarding follow-up with his PCP for hyponatremia.  He did not understand that this was something that needed to be addressed preoperatively, so he had not taken any measures to get in touch with his PCP Dr. Elmon Else with the [facility] clinic in [place] (office number is [phone]; fax [phone]).  I spoke with staff at Dr. Broadus John office.  He will be in clinic this afternoon, so I faxed his labs, med list, and my note to his office for review and asked that he contact patient with recommendations.  He will need a BMET repeated pre-operatively.  I've sent Dr. Luisa Hart a staff message regarding above update, and also regarding timing of repeat BMET.   Velna Ochs [facility] Short Stay Center/Anesthesiology Phone [phone] 05/05/2013 11:18 AM  Addendum: 05/08/13 1:45 PM Patient returned for repeat BMET today.  Na, Cl now WNL.  Anticipate that he can proceed as planned.

## 2013-05-08 ENCOUNTER — Encounter (HOSPITAL_COMMUNITY)
Admission: RE | Admit: 2013-05-08 | Discharge: 2013-05-08 | Disposition: A | Discharge status: Home / Self Care | Payer: BC Managed Care – PPO | Source: Ambulatory Visit | Attending: Surgery | Admitting: Surgery

## 2013-05-08 DIAGNOSIS — Z01812 Encounter for preprocedural laboratory examination: Secondary | ICD-10-CM | POA: Insufficient documentation

## 2013-05-08 LAB — BASIC METABOLIC PANEL
BUN: 17 mg/dL (ref 6–23)
Calcium: 9.4 mg/dL (ref 8.4–10.5)
Creatinine, Ser: 1.11 mg/dL (ref 0.50–1.35)
GFR calc Af Amer: 80 mL/min — ABNORMAL LOW (ref >90)
GFR calc non Af Amer: 69 mL/min — ABNORMAL LOW (ref >90)

## 2013-05-11 MED ORDER — CLINDAMYCIN PHOSPHATE 900 MG/50ML IV SOLN
900.0000 mg | INTRAVENOUS | Status: AC
  Administered 2013-05-12: 900 mg via INTRAVENOUS
  Filled 2013-05-11: qty 50

## 2013-05-11 MED ORDER — ERYTHROMYCIN BASE 250 MG PO TABS
1000.0000 mg | ORAL_TABLET | ORAL | Status: DC
  Filled 2013-05-11 (×2): qty 4

## 2013-05-11 MED ORDER — NEOMYCIN SULFATE 500 MG PO TABS
1000.0000 mg | ORAL_TABLET | ORAL | Status: DC
  Filled 2013-05-11 (×2): qty 2

## 2013-05-11 MED ORDER — ALVIMOPAN 12 MG PO CAPS
12.0000 mg | ORAL_CAPSULE | Freq: Once | ORAL | Status: AC
  Administered 2013-05-12: 12 mg via ORAL
  Filled 2013-05-11: qty 1

## 2013-05-11 MED ORDER — GENTAMICIN SULFATE 40 MG/ML IJ SOLN
5.0000 mg/kg | INTRAVENOUS | Status: AC
  Administered 2013-05-12: 459 mg via INTRAVENOUS
  Filled 2013-05-11: qty 11.48

## 2013-05-12 ENCOUNTER — Encounter (HOSPITAL_COMMUNITY): Payer: Self-pay | Admitting: Vascular Surgery

## 2013-05-12 ENCOUNTER — Inpatient Hospital Stay (HOSPITAL_COMMUNITY)
Admission: RE | Admit: 2013-05-12 | Discharge: 2013-05-19 | DRG: 585 | Disposition: A | Discharge status: Home / Self Care | Payer: BC Managed Care – PPO | Source: Ambulatory Visit | Attending: Surgery | Admitting: Surgery

## 2013-05-12 ENCOUNTER — Encounter (HOSPITAL_COMMUNITY): Payer: Self-pay | Admitting: *Deleted

## 2013-05-12 ENCOUNTER — Inpatient Hospital Stay (HOSPITAL_COMMUNITY): Payer: BC Managed Care – PPO | Admitting: Anesthesiology

## 2013-05-12 ENCOUNTER — Encounter (HOSPITAL_COMMUNITY)
Admission: RE | Disposition: A | Discharge status: Home / Self Care | Payer: Self-pay | Source: Ambulatory Visit | Attending: Surgery

## 2013-05-12 DIAGNOSIS — K529 Noninfective gastroenteritis and colitis, unspecified: Secondary | ICD-10-CM

## 2013-05-12 DIAGNOSIS — M545 Low back pain, unspecified: Secondary | ICD-10-CM

## 2013-05-12 DIAGNOSIS — Z933 Colostomy status: Secondary | ICD-10-CM

## 2013-05-12 DIAGNOSIS — Z9889 Other specified postprocedural states: Secondary | ICD-10-CM

## 2013-05-12 DIAGNOSIS — Z9189 Other specified personal risk factors, not elsewhere classified: Secondary | ICD-10-CM

## 2013-05-12 DIAGNOSIS — I739 Peripheral vascular disease, unspecified: Secondary | ICD-10-CM

## 2013-05-12 DIAGNOSIS — R197 Diarrhea, unspecified: Secondary | ICD-10-CM

## 2013-05-12 DIAGNOSIS — J189 Pneumonia, unspecified organism: Secondary | ICD-10-CM | POA: Diagnosis not present

## 2013-05-12 DIAGNOSIS — J9819 Other pulmonary collapse: Secondary | ICD-10-CM | POA: Diagnosis not present

## 2013-05-12 DIAGNOSIS — Z85118 Personal history of other malignant neoplasm of bronchus and lung: Secondary | ICD-10-CM

## 2013-05-12 DIAGNOSIS — I10 Essential (primary) hypertension: Secondary | ICD-10-CM

## 2013-05-12 DIAGNOSIS — Z433 Encounter for attention to colostomy: Secondary | ICD-10-CM

## 2013-05-12 DIAGNOSIS — K559 Vascular disorder of intestine, unspecified: Secondary | ICD-10-CM

## 2013-05-12 DIAGNOSIS — J449 Chronic obstructive pulmonary disease, unspecified: Secondary | ICD-10-CM

## 2013-05-12 DIAGNOSIS — K9413 Enterostomy malfunction: Principal | ICD-10-CM | POA: Diagnosis present

## 2013-05-12 DIAGNOSIS — M5126 Other intervertebral disc displacement, lumbar region: Secondary | ICD-10-CM

## 2013-05-12 DIAGNOSIS — K9403 Colostomy malfunction: Principal | ICD-10-CM | POA: Diagnosis present

## 2013-05-12 DIAGNOSIS — I499 Cardiac arrhythmia, unspecified: Secondary | ICD-10-CM

## 2013-05-12 DIAGNOSIS — I4891 Unspecified atrial fibrillation: Secondary | ICD-10-CM | POA: Diagnosis present

## 2013-05-12 DIAGNOSIS — N400 Enlarged prostate without lower urinary tract symptoms: Secondary | ICD-10-CM

## 2013-05-12 DIAGNOSIS — E7800 Pure hypercholesterolemia, unspecified: Secondary | ICD-10-CM | POA: Diagnosis present

## 2013-05-12 DIAGNOSIS — I480 Paroxysmal atrial fibrillation: Secondary | ICD-10-CM

## 2013-05-12 DIAGNOSIS — R748 Abnormal levels of other serum enzymes: Secondary | ICD-10-CM

## 2013-05-12 DIAGNOSIS — R0602 Shortness of breath: Secondary | ICD-10-CM

## 2013-05-12 DIAGNOSIS — R220 Localized swelling, mass and lump, head: Secondary | ICD-10-CM

## 2013-05-12 DIAGNOSIS — R739 Hyperglycemia, unspecified: Secondary | ICD-10-CM

## 2013-05-12 DIAGNOSIS — Z8719 Personal history of other diseases of the digestive system: Secondary | ICD-10-CM

## 2013-05-12 DIAGNOSIS — E785 Hyperlipidemia, unspecified: Secondary | ICD-10-CM

## 2013-05-12 DIAGNOSIS — Z87891 Personal history of nicotine dependence: Secondary | ICD-10-CM

## 2013-05-12 DIAGNOSIS — K660 Peritoneal adhesions (postprocedural) (postinfection): Secondary | ICD-10-CM | POA: Diagnosis present

## 2013-05-12 DIAGNOSIS — J4489 Other specified chronic obstructive pulmonary disease: Secondary | ICD-10-CM | POA: Diagnosis present

## 2013-05-12 DIAGNOSIS — R1110 Vomiting, unspecified: Secondary | ICD-10-CM

## 2013-05-12 DIAGNOSIS — R6510 Systemic inflammatory response syndrome (SIRS) of non-infectious origin without acute organ dysfunction: Secondary | ICD-10-CM

## 2013-05-12 DIAGNOSIS — K219 Gastro-esophageal reflux disease without esophagitis: Secondary | ICD-10-CM

## 2013-05-12 HISTORY — DX: Shortness of breath: R06.02

## 2013-05-12 HISTORY — DX: Other chronic pain: G89.29

## 2013-05-12 HISTORY — DX: Obstructive sleep apnea (adult) (pediatric): G47.33

## 2013-05-12 HISTORY — DX: Low back pain: M54.5

## 2013-05-12 HISTORY — DX: Peripheral vascular disease, unspecified: I73.9

## 2013-05-12 HISTORY — DX: Dependence on other enabling machines and devices: Z99.89

## 2013-05-12 HISTORY — DX: Dorsalgia, unspecified: M54.9

## 2013-05-12 HISTORY — DX: Low back pain, unspecified: M54.50

## 2013-05-12 HISTORY — PX: COLOSTOMY REVERSAL: SHX5782

## 2013-05-12 HISTORY — PX: COLOSTOMY CLOSURE: SHX1381

## 2013-05-12 HISTORY — DX: Aortic aneurysm of unspecified site, without rupture: I71.9

## 2013-05-12 LAB — CBC
MCH: 26.5 pg (ref 26.0–34.0)
MCHC: 31.9 g/dL (ref 30.0–36.0)
RDW: 16.6 % — ABNORMAL HIGH (ref 11.5–15.5)

## 2013-05-12 SURGERY — COLOSTOMY CLOSURE
Anesthesia: General | Site: Abdomen | Wound class: Contaminated

## 2013-05-12 MED ORDER — KCL IN DEXTROSE-NACL 20-5-0.9 MEQ/L-%-% IV SOLN
INTRAVENOUS | Status: DC
  Administered 2013-05-12 – 2013-05-16 (×9): via INTRAVENOUS
  Administered 2013-05-17: 125 mL/h via INTRAVENOUS
  Administered 2013-05-17 (×2): via INTRAVENOUS
  Administered 2013-05-18: 125 mL/h via INTRAVENOUS
  Administered 2013-05-18: 15:00:00 via INTRAVENOUS
  Filled 2013-05-12 (×20): qty 1000

## 2013-05-12 MED ORDER — ONDANSETRON HCL 4 MG/2ML IJ SOLN
4.0000 mg | Freq: Four times a day (QID) | INTRAMUSCULAR | Status: DC | PRN
  Filled 2013-05-12: qty 2

## 2013-05-12 MED ORDER — BUDESONIDE-FORMOTEROL FUMARATE 160-4.5 MCG/ACT IN AERO
2.0000 | INHALATION_SPRAY | Freq: Two times a day (BID) | RESPIRATORY_TRACT | Status: DC
  Administered 2013-05-12 – 2013-05-18 (×9): 2 via RESPIRATORY_TRACT
  Filled 2013-05-12 (×3): qty 6

## 2013-05-12 MED ORDER — LISINOPRIL-HYDROCHLOROTHIAZIDE 20-12.5 MG PO TABS: 1.0000 | ORAL_TABLET | Freq: Every day | ORAL | Status: DC

## 2013-05-12 MED ORDER — HYDROMORPHONE 0.3 MG/ML IV SOLN
INTRAVENOUS | Status: AC
  Filled 2013-05-12: qty 25

## 2013-05-12 MED ORDER — DILTIAZEM HCL ER BEADS 240 MG PO CP24
240.0000 mg | ORAL_CAPSULE | Freq: Two times a day (BID) | ORAL | Status: DC
  Administered 2013-05-12 – 2013-05-19 (×13): 240 mg via ORAL
  Filled 2013-05-12 (×17): qty 1

## 2013-05-12 MED ORDER — ALVIMOPAN 12 MG PO CAPS
12.0000 mg | ORAL_CAPSULE | Freq: Two times a day (BID) | ORAL | Status: DC
  Administered 2013-05-13 (×2): 12 mg via ORAL
  Filled 2013-05-12 (×4): qty 1

## 2013-05-12 MED ORDER — PROMETHAZINE HCL 25 MG/ML IJ SOLN: 6.2500 mg | INTRAMUSCULAR | Status: DC | PRN

## 2013-05-12 MED ORDER — EPHEDRINE SULFATE 50 MG/ML IJ SOLN
INTRAMUSCULAR | Status: DC | PRN
  Administered 2013-05-12 (×3): 10 mg via INTRAVENOUS
  Administered 2013-05-12: 5 mg via INTRAVENOUS

## 2013-05-12 MED ORDER — DIPHENHYDRAMINE HCL 12.5 MG/5ML PO ELIX: 12.5000 mg | ORAL_SOLUTION | Freq: Four times a day (QID) | ORAL | Status: DC | PRN

## 2013-05-12 MED ORDER — ALBUMIN HUMAN 5 % IV SOLN
INTRAVENOUS | Status: DC | PRN
  Administered 2013-05-12: 10:00:00 via INTRAVENOUS

## 2013-05-12 MED ORDER — FENTANYL CITRATE 0.05 MG/ML IJ SOLN
INTRAMUSCULAR | Status: DC | PRN
  Administered 2013-05-12 (×2): 50 ug via INTRAVENOUS
  Administered 2013-05-12: 150 ug via INTRAVENOUS

## 2013-05-12 MED ORDER — HYDROMORPHONE 0.3 MG/ML IV SOLN
INTRAVENOUS | Status: DC
  Administered 2013-05-12: 1.2 mg via INTRAVENOUS
  Administered 2013-05-12: 19:00:00 via INTRAVENOUS
  Administered 2013-05-12: 4.8 mg via INTRAVENOUS
  Filled 2013-05-12: qty 25

## 2013-05-12 MED ORDER — ALVIMOPAN 12 MG PO CAPS: 12.0000 mg | ORAL_CAPSULE | Freq: Once | ORAL | Status: DC

## 2013-05-12 MED ORDER — SODIUM CHLORIDE 0.9 % IJ SOLN: 9.0000 mL | INTRAMUSCULAR | Status: DC | PRN

## 2013-05-12 MED ORDER — LISINOPRIL 20 MG PO TABS
20.0000 mg | ORAL_TABLET | Freq: Every day | ORAL | Status: DC
  Administered 2013-05-13 – 2013-05-19 (×7): 20 mg via ORAL
  Filled 2013-05-12 (×7): qty 1

## 2013-05-12 MED ORDER — ONDANSETRON HCL 4 MG/2ML IJ SOLN
INTRAMUSCULAR | Status: DC | PRN
  Administered 2013-05-12: 4 mg via INTRAVENOUS

## 2013-05-12 MED ORDER — HYDROMORPHONE HCL PF 1 MG/ML IJ SOLN
INTRAMUSCULAR | Status: AC
  Filled 2013-05-12: qty 1

## 2013-05-12 MED ORDER — OXYCODONE HCL 5 MG PO TABS
5.0000 mg | ORAL_TABLET | Freq: Once | ORAL | Status: AC | PRN
  Administered 2013-05-12: 5 mg via ORAL

## 2013-05-12 MED ORDER — CIPROFLOXACIN IN D5W 400 MG/200ML IV SOLN
400.0000 mg | Freq: Two times a day (BID) | INTRAVENOUS | Status: AC
  Administered 2013-05-12: 400 mg via INTRAVENOUS
  Filled 2013-05-12: qty 200

## 2013-05-12 MED ORDER — NEOSTIGMINE METHYLSULFATE 1 MG/ML IJ SOLN
INTRAMUSCULAR | Status: DC | PRN
  Administered 2013-05-12: 5 mg via INTRAVENOUS

## 2013-05-12 MED ORDER — ONDANSETRON HCL 4 MG/2ML IJ SOLN
4.0000 mg | Freq: Four times a day (QID) | INTRAMUSCULAR | Status: DC | PRN
Start: 2013-05-12 — End: 2013-05-19
  Administered 2013-05-13 – 2013-05-14 (×2): 4 mg via INTRAVENOUS
  Filled 2013-05-12: qty 2

## 2013-05-12 MED ORDER — ALBUTEROL SULFATE HFA 108 (90 BASE) MCG/ACT IN AERS
2.0000 | INHALATION_SPRAY | Freq: Four times a day (QID) | RESPIRATORY_TRACT | Status: DC | PRN
  Administered 2013-05-13: 2 via RESPIRATORY_TRACT
  Filled 2013-05-12: qty 6.7

## 2013-05-12 MED ORDER — DIPHENHYDRAMINE HCL 50 MG/ML IJ SOLN: 12.5000 mg | Freq: Four times a day (QID) | INTRAMUSCULAR | Status: DC | PRN

## 2013-05-12 MED ORDER — OXYCODONE HCL 5 MG/5ML PO SOLN: 5.0000 mg | Freq: Once | ORAL | Status: AC | PRN

## 2013-05-12 MED ORDER — OXYCODONE-ACETAMINOPHEN 5-325 MG PO TABS: 1.0000 | ORAL_TABLET | ORAL | Status: DC | PRN

## 2013-05-12 MED ORDER — LACTATED RINGERS IV SOLN
INTRAVENOUS | Status: DC | PRN
  Administered 2013-05-12 (×3): via INTRAVENOUS

## 2013-05-12 MED ORDER — METRONIDAZOLE IN NACL 5-0.79 MG/ML-% IV SOLN
500.0000 mg | Freq: Three times a day (TID) | INTRAVENOUS | Status: AC
  Administered 2013-05-12: 500 mg via INTRAVENOUS
  Filled 2013-05-12: qty 100

## 2013-05-12 MED ORDER — TIOTROPIUM BROMIDE MONOHYDRATE 18 MCG IN CAPS
18.0000 ug | ORAL_CAPSULE | Freq: Every day | RESPIRATORY_TRACT | Status: DC
  Administered 2013-05-14 – 2013-05-18 (×5): 18 ug via RESPIRATORY_TRACT
  Filled 2013-05-12 (×4): qty 5

## 2013-05-12 MED ORDER — ROCURONIUM BROMIDE 100 MG/10ML IV SOLN
INTRAVENOUS | Status: DC | PRN
  Administered 2013-05-12: 10 mg via INTRAVENOUS
  Administered 2013-05-12: 50 mg via INTRAVENOUS
  Administered 2013-05-12: 10 mg via INTRAVENOUS

## 2013-05-12 MED ORDER — FINASTERIDE 5 MG PO TABS
5.0000 mg | ORAL_TABLET | Freq: Every day | ORAL | Status: DC
  Administered 2013-05-12 – 2013-05-19 (×8): 5 mg via ORAL
  Filled 2013-05-12 (×8): qty 1

## 2013-05-12 MED ORDER — LACTATED RINGERS IV SOLN
Freq: Once | INTRAVENOUS | Status: AC
  Administered 2013-05-12: 07:00:00 via INTRAVENOUS

## 2013-05-12 MED ORDER — ENOXAPARIN SODIUM 40 MG/0.4ML ~~LOC~~ SOLN: 40.0000 mg | SUBCUTANEOUS | Status: DC

## 2013-05-12 MED ORDER — OXYCODONE HCL 5 MG PO TABS
ORAL_TABLET | ORAL | Status: AC
  Filled 2013-05-12: qty 1

## 2013-05-12 MED ORDER — ONDANSETRON HCL 4 MG PO TABS: 4.0000 mg | ORAL_TABLET | Freq: Four times a day (QID) | ORAL | Status: DC | PRN

## 2013-05-12 MED ORDER — 0.9 % SODIUM CHLORIDE (POUR BTL) OPTIME
TOPICAL | Status: DC | PRN
  Administered 2013-05-12 (×5): 1000 mL

## 2013-05-12 MED ORDER — METOPROLOL TARTRATE 25 MG PO TABS
25.0000 mg | ORAL_TABLET | Freq: Two times a day (BID) | ORAL | Status: DC
  Administered 2013-05-12 – 2013-05-19 (×14): 25 mg via ORAL
  Filled 2013-05-12 (×16): qty 1

## 2013-05-12 MED ORDER — HYDROMORPHONE HCL PF 1 MG/ML IJ SOLN
0.2500 mg | INTRAMUSCULAR | Status: DC | PRN
  Administered 2013-05-12 (×4): 0.5 mg via INTRAVENOUS

## 2013-05-12 MED ORDER — SODIUM CHLORIDE 0.9 % IR SOLN
Status: DC | PRN
  Administered 2013-05-12: 1000 mL

## 2013-05-12 MED ORDER — PROPOFOL 10 MG/ML IV BOLUS
INTRAVENOUS | Status: DC | PRN
  Administered 2013-05-12: 200 mg via INTRAVENOUS

## 2013-05-12 MED ORDER — ENOXAPARIN SODIUM 40 MG/0.4ML ~~LOC~~ SOLN
40.0000 mg | SUBCUTANEOUS | Status: DC
  Administered 2013-05-13 – 2013-05-19 (×7): 40 mg via SUBCUTANEOUS
  Filled 2013-05-12 (×9): qty 0.4

## 2013-05-12 MED ORDER — GLYCOPYRROLATE 0.2 MG/ML IJ SOLN
INTRAMUSCULAR | Status: DC | PRN
  Administered 2013-05-12: .9 mg via INTRAVENOUS

## 2013-05-12 MED ORDER — LIDOCAINE HCL (CARDIAC) 20 MG/ML IV SOLN
INTRAVENOUS | Status: DC | PRN
  Administered 2013-05-12: 100 mg via INTRAVENOUS

## 2013-05-12 MED ORDER — MIDAZOLAM HCL 5 MG/5ML IJ SOLN
INTRAMUSCULAR | Status: DC | PRN
  Administered 2013-05-12: 2 mg via INTRAVENOUS

## 2013-05-12 MED ORDER — SIMVASTATIN 10 MG PO TABS
10.0000 mg | ORAL_TABLET | Freq: Every day | ORAL | Status: DC
  Administered 2013-05-13 – 2013-05-18 (×6): 10 mg via ORAL
  Filled 2013-05-12 (×7): qty 1

## 2013-05-12 MED ORDER — NALOXONE HCL 0.4 MG/ML IJ SOLN: 0.4000 mg | INTRAMUSCULAR | Status: DC | PRN

## 2013-05-12 MED ORDER — HYDROCHLOROTHIAZIDE 12.5 MG PO CAPS
12.5000 mg | ORAL_CAPSULE | Freq: Every day | ORAL | Status: DC
  Administered 2013-05-13 – 2013-05-19 (×7): 12.5 mg via ORAL
  Filled 2013-05-12 (×7): qty 1

## 2013-05-12 SURGICAL SUPPLY — 60 items
BANDAGE GAUZE ELAST BULKY 4 IN (GAUZE/BANDAGES/DRESSINGS) ×2 IMPLANT
BLADE SURG ROTATE 9660 (MISCELLANEOUS) IMPLANT
CANISTER SUCTION 2500CC (MISCELLANEOUS) ×2 IMPLANT
CLOTH BEACON ORANGE TIMEOUT ST (SAFETY) ×2 IMPLANT
COVER MAYO STAND STRL (DRAPES) ×2 IMPLANT
COVER SURGICAL LIGHT HANDLE (MISCELLANEOUS) ×2 IMPLANT
DRAPE LAPAROSCOPIC ABDOMINAL (DRAPES) ×2 IMPLANT
DRAPE PROXIMA HALF (DRAPES) ×2 IMPLANT
DRAPE UTILITY 15X26 W/TAPE STR (DRAPE) ×6 IMPLANT
DRAPE WARM FLUID 44X44 (DRAPE) ×2 IMPLANT
DRSG OPSITE POSTOP 4X10 (GAUZE/BANDAGES/DRESSINGS) ×4 IMPLANT
DRSG OPSITE POSTOP 4X6 (GAUZE/BANDAGES/DRESSINGS) ×2 IMPLANT
DRSG PAD ABDOMINAL 8X10 ST (GAUZE/BANDAGES/DRESSINGS) ×2 IMPLANT
ELECT BLADE 6.5 EXT (BLADE) IMPLANT
ELECT CAUTERY BLADE 6.4 (BLADE) ×2 IMPLANT
ELECT REM PT RETURN 9FT ADLT (ELECTROSURGICAL) ×2
ELECTRODE REM PT RTRN 9FT ADLT (ELECTROSURGICAL) ×1 IMPLANT
GEL ULTRASOUND 20GR AQUASONIC (MISCELLANEOUS) ×2 IMPLANT
GLOVE BIO SURGEON STRL SZ8 (GLOVE) ×14 IMPLANT
GLOVE BIO SURGEON STRL SZ8.5 (GLOVE) ×2 IMPLANT
GLOVE BIOGEL PI IND STRL 7.0 (GLOVE) ×3 IMPLANT
GLOVE BIOGEL PI IND STRL 8 (GLOVE) ×6 IMPLANT
GLOVE BIOGEL PI INDICATOR 7.0 (GLOVE) ×3
GLOVE BIOGEL PI INDICATOR 8 (GLOVE) ×6
GLOVE SURG SS PI 7.0 STRL IVOR (GLOVE) ×6 IMPLANT
GOWN PREVENTION PLUS XXLARGE (GOWN DISPOSABLE) ×4 IMPLANT
GOWN STRL NON-REIN LRG LVL3 (GOWN DISPOSABLE) ×4 IMPLANT
GOWN STRL REIN XL XLG (GOWN DISPOSABLE) ×4 IMPLANT
KIT BASIN OR (CUSTOM PROCEDURE TRAY) ×2 IMPLANT
KIT ROOM TURNOVER OR (KITS) ×2 IMPLANT
LEGGING LITHOTOMY PAIR STRL (DRAPES) ×2 IMPLANT
LIGASURE IMPACT 36 18CM CVD LR (INSTRUMENTS) ×2 IMPLANT
NS IRRIG 1000ML POUR BTL (IV SOLUTION) ×8 IMPLANT
PACK GENERAL/GYN (CUSTOM PROCEDURE TRAY) ×2 IMPLANT
PAD ARMBOARD 7.5X6 YLW CONV (MISCELLANEOUS) ×2 IMPLANT
PAD SHARPS MAGNETIC DISPOSAL (MISCELLANEOUS) IMPLANT
RELOAD PROXIMATE 75MM BLUE (ENDOMECHANICALS) ×2 IMPLANT
SET IRRIG TUBING LAPAROSCOPIC (IRRIGATION / IRRIGATOR) ×2 IMPLANT
SPONGE LAP 18X18 X RAY DECT (DISPOSABLE) ×10 IMPLANT
STAPLER CIRC CVD 29MM 37CM (STAPLE) ×2 IMPLANT
STAPLER GUN LINEAR PROX 60 (STAPLE) ×2 IMPLANT
STAPLER PROXIMATE 75MM BLUE (STAPLE) ×2 IMPLANT
STAPLER VISISTAT 35W (STAPLE) ×2 IMPLANT
SUCTION POOLE TIP (SUCTIONS) ×2 IMPLANT
SUT NOVA NAB DX-16 0-1 5-0 T12 (SUTURE) ×4 IMPLANT
SUT PDS AB 1 TP1 96 (SUTURE) ×4 IMPLANT
SUT PROLENE 2 0 KS (SUTURE) ×2 IMPLANT
SUT SILK 3 0 TIES 10X30 (SUTURE) ×2 IMPLANT
SUT VIC AB 2-0 SH 18 (SUTURE) ×2 IMPLANT
SUT VIC AB 3-0 SH 18 (SUTURE) ×4 IMPLANT
SUT VICRYL AB 2 0 TIES (SUTURE) ×2 IMPLANT
SUT VICRYL AB 3 0 TIES (SUTURE) ×2 IMPLANT
SYR BULB IRRIGATION 50ML (SYRINGE) ×2 IMPLANT
TAPE CLOTH SURG 4X10 WHT LF (GAUZE/BANDAGES/DRESSINGS) ×2 IMPLANT
TOWEL OR 17X26 10 PK STRL BLUE (TOWEL DISPOSABLE) ×4 IMPLANT
TRAY FOLEY CATH 14FRSI W/METER (CATHETERS) ×2 IMPLANT
TRAY PROCTOSCOPIC FIBER OPTIC (SET/KITS/TRAYS/PACK) ×4 IMPLANT
UNDERPAD 30X30 INCONTINENT (UNDERPADS AND DIAPERS) ×4 IMPLANT
WATER STERILE IRR 1000ML POUR (IV SOLUTION) IMPLANT
YANKAUER SUCT BULB TIP NO VENT (SUCTIONS) ×2 IMPLANT

## 2013-05-12 NOTE — Transfer of Care (Signed)
Immediate Anesthesia Transfer of Care Note  Patient: James [name]  Procedure(s) Performed: Procedure(s) with comments: COLOSTOMY CLOSURE (N/A) - yellow fin stirrups   Patient Location: PACU  Anesthesia Type:General  Level of Consciousness: awake, alert  and oriented  Airway & Oxygen Therapy: Patient Spontanous Breathing and Patient connected to nasal cannula oxygen  Post-op Assessment: Report given to PACU RN and Post -op Vital signs reviewed and stable  Post vital signs: Reviewed and stable  Complications: No apparent anesthesia complications

## 2013-05-12 NOTE — H&P (Signed)
Currently admitted as of 05/12/2013  Demographics CUSTER PIMENTA 62 year old male  Comm Pref:   [address]  [place] [address] [phone] (H)   Problem ListHospitalization Problem  Non-Hospital   HYPERLIPIDEMIA   HYPERTENSION   PERIPHERAL VASCULAR DISEASE   Paroxysmal a-fib, spont conversion to NSR   COPD (chronic obstructive pulmonary disease)    GERD    BENIGN PROSTATIC HYPERTROPHY    HERNIATED LUMBAR DISC    LOW BACK PAIN    LUNG CANCER, HX OF    DIVERTICULITIS, HX OF    Emesis    Diarrhea    Swollen upper lip    Hyperglycemia, stress related    Elevated alkaline phosphatase level    SIRS criteria on admission (leukocytosis, hypotension, hypothermia)    Enteritis    Ischemic colitis    Post-operative state    Shortness of breath    Pre-operative cardiovascular examination, supraventricular arrhythmia  Significant History/Details  Smoking: Former Smoker (Quit Date:10/02/2003)  Smokeless Tobacco: Current User  Alcohol: No  3 open orders  Preferred Language: English   Date of Birth[birth year]Specialty CommentsEditShow AllReport3/4/14 pt signed Kahner Yanik 10/11/1953 DOS 5.20.14 TC/[facility] Colostomy Closure/4.28.14 tlc 01/30/2013 patient scheduled for in-patient surgery 02/17/2013 @ [facility] authorization # [id number] per fax rec'd Lynwood Dawley [phone] ext [phone]. (chm,tlc)  02/16/2013 patient surgery 02/17/2013 @ [facility] has been cancelled per change sheet rec'd Erin T. (chm)  04/28/2013 patient scheduled for in-patient surgery 05/12/2013 @ [facility] authorization # [id number] per fax rec'd Lynwood Dawley [phone] ext [phone]. (chm,kh)    MedicationsHospital Medications Outpatient Medications   New medications from outside sources are available for reconciliation  clindamycin (CLEOCIN) IVPB 900 mg  erythromycin (E-MYCIN) tablet 1,000 mg  gentamicin (GARAMYCIN) 459.2 mg in dextrose 5 % 100 mL IVPB  neomycin (MYCIFRADIN) tablet 1,000 mg                  Relevant Labs (3  years)  Na K Cl C02 WBC Hgb Hct Plts  05/08/13 1054 139 4.3 100 -- -- -- -- --  04/29/13 1456 125 3.7 87 -- -- -- -- --  04/29/13 1456 -- -- -- -- 11.3 12.9 37.6 367  02/11/13 1200 -- -- -- -- 9.9 11.9 37.8 371  02/11/13 1200 141 4.4 103 -- -- -- -- --    Relevant Encounters (Maximum of 5 visits)Date Type Department Provider Description  05/12/2013 Surgery [facility] OPERATING ROOM Dortha Schwalbe., MD   04/30/2013 [facility] Surgery, [place], [place]   04/29/2013 Orders Only [place] [facility], [place] Harriette Bouillon A., MD   04/20/2013 [facility] Surgery, PA [place], [place]   04/13/2013 Orders Only [facility], [place] Dortha Schwalbe., MD           My Last Outpatient Progress NoteStatus Last Edited Encounter Date  Signed Mon Jan 26, 2013 10:28 AM EDT 01/26/2013  Patient ID: [name], male   DOB: May 29, 1951, 62 y.o.   MRN: [id number]    Chief Complaint   Patient presents with   .  Routine Post Op       reck wound      HPI Mj Willis is a 62 y.o. male.  Patient returns in followup for ischemic colitis. He a colonoscopy and this was normal both per colostomy and per anus. He has no complaints HPI    Past Medical History   Diagnosis  Date   .  Cancer of lung  s/p resection in 2005 at [facility] / [place] patient   .  COPD (chronic obstructive pulmonary disease)     .  Hypertension     .  High cholesterol     .  Aorta aneurysm     .  Exposure to Agent Orange         [place] War       Past Surgical History   Procedure  Laterality  Date   .  Lung surgery       .  Laparotomy  N/A  11/08/2012       Procedure: EXPLORATORY LAPAROTOMY with descending and sigmoid colectomy; wound vac placement and colostomy;  Surgeon: Clovis Pu. [name], MD;  Location: [facility] OR;  Service: General;  Laterality: N/A;   .  Femoral artery stent           Family History   Problem  Relation  Age of Onset   .  Heart attack  Mother      .  Tuberculosis  Mother     .  Heart attack  Father     .  Stroke  Father     .  Heart attack  Brother     .  Heart disease  Sister     .  Cancer  Brother         Prostate      Social History History   Substance Use Topics   .  Smoking status:  Former Smoker       Types:  Cigarettes       Quit date:  10/02/2003   .  Smokeless tobacco:  Current User       Types:  Chew   .  Alcohol Use:  No       Allergies   Allergen  Reactions   .  Amlodipine Besy-Benazepril Hcl         REACTION: sob   .  Meloxicam         REACTION: stomach upset   .  Penicillins         REACTION: Rash       Current Outpatient Prescriptions   Medication  Sig  Dispense  Refill   .  albuterol (PROVENTIL HFA;VENTOLIN HFA) 108 (90 BASE) MCG/ACT inhaler  Inhale 2 puffs into the lungs every 6 (six) hours as needed for wheezing. For shortness of breath         .  aspirin 81 MG tablet  Take 81 mg by mouth daily.         .  budesonide-formoterol (SYMBICORT) 160-4.5 MCG/ACT inhaler  Inhale 2 puffs into the lungs 2 (two) times daily.         Marland Kitchen  diltiazem (TIAZAC) 240 MG 24 hr capsule  Take 240 mg by mouth 2 (two) times daily.         .  finasteride (PROSCAR) 5 MG tablet  Take 5 mg by mouth daily.         .  fish oil-omega-3 fatty acids 1000 MG capsule  Take 2 g by mouth 2 (two) times daily.         Marland Kitchen  gemfibrozil (LOPID) 600 MG tablet  Take 600 mg by mouth 2 (two) times daily before a meal.         .  HYDROcodone-acetaminophen (LORTAB) 10-500 MG per tablet  Take 1 tablet by mouth 4 (four) times daily as needed for pain. For pain         .  lisinopril-hydrochlorothiazide (PRINZIDE,ZESTORETIC) 20-12.5 MG per tablet           .  omeprazole (PRILOSEC OTC) 20 MG tablet  Take 20 mg by mouth 2 (two) times daily.         .  pravastatin (PRAVACHOL) 40 MG tablet           .  tiotropium (SPIRIVA) 18 MCG inhalation capsule  Place 18 mcg into inhaler and inhale daily.             No current facility-administered medications  for this visit.      Review of Systems Review of Systems  Constitutional: Negative.   HENT: Negative.   Eyes: Negative.   Respiratory: Negative.   Cardiovascular: Negative.   Gastrointestinal: Negative.   Genitourinary: Negative.   Allergic/Immunologic: Negative.   Hematological: Negative.   Psychiatric/Behavioral: Negative.  Negative for agitation.    Blood pressure 168/70, pulse 63, temperature 97.4 F (36.3 C), temperature source Temporal, resp. rate 18, height 6\' 2"  (1.88 m), weight 192 lb 6.4 oz (87.272 kg).   Physical Exam Physical Exam  Constitutional: He appears well-developed and well-nourished.  HENT:   Head: Normocephalic and atraumatic.  Eyes: EOM are normal. Pupils are equal, round, and reactive to light.  Neck: Normal range of motion.  Cardiovascular: Normal rate and regular rhythm.   Pulmonary/Chest: Effort normal and breath sounds normal.  Abdominal:  LLQ ostomy.  Midline scar noted.     Data Reviewed Colonoscopy at [facility]  Normal    Assessment History of ischemic colitis status post resection of splenic flexure and descending colon with colostomy    Patient Active Problem List   Diagnosis   .  HYPERLIPIDEMIA   .  HYPERTENSION   .  PERIPHERAL VASCULAR DISEASE   .  COPD   .  GERD   .  BENIGN PROSTATIC HYPERTROPHY   .  HERNIATED LUMBAR DISC   .  LOW BACK PAIN   .  LUNG CANCER, HX OF   .  DIVERTICULITIS, HX OF   .  Hypovolemic shock   .  Abdominal pain   .  Emesis   .  Diarrhea   .  Swollen upper lip   .  Paroxysmal a-fib, spont conversion to NSR   .  History of hypertension   .  COPD (chronic obstructive pulmonary disease)   .  Hyperlipidemia   .  Hyperglycemia, stress related   .  Elevated alkaline phosphatase level   .  SIRS criteria on admission (leukocytosis, hypotension, hypothermia)   .  Enteritis   .  Ischemic colitis   .  Post-operative state      Plan Colostomy reversal. He may require a completion colectomy with an  ileocolic anastomosis versus a colocolonic anastomosis depending upon anatomy. I have discussed both with the patient and his wife today. I discussed the implications of each from the standpoint of bowel movements and quality of life issues after surgery. I discussed risk of surgery to include but not exclusive of bleeding, infection, anastomotic breakdown, death, exacerbation of underlying medical problems, pulmonary failure, renal failure, the need for other operations, hernia formation, wound complications, DVT and pulmonary embolus. They understand and wish to proceed. Questions are answered. Have addressed a fib and electrolyte issues as outpatient and ready to proceed.        Deanthony Maull A.

## 2013-05-12 NOTE — Progress Notes (Signed)
Report given to maryann rn as caregiver 

## 2013-05-12 NOTE — Anesthesia Postprocedure Evaluation (Signed)
Anesthesia Post Note  Patient: James [name]  Procedure(s) Performed: Procedure(s) (LRB): COLOSTOMY CLOSURE (N/A)  Anesthesia type: general  Patient location: PACU  Post pain: Pain level controlled  Post assessment: Patient's Cardiovascular Status Stable  Last Vitals:  Filed Vitals:   05/12/13 1255  BP:   Pulse: 65  Temp:   Resp: 13    Post vital signs: Reviewed and stable  Level of consciousness: sedated  Complications: No apparent anesthesia complications

## 2013-05-12 NOTE — Progress Notes (Signed)
Full dose Dilaudid PCA verified by S. Gregson  RN 

## 2013-05-12 NOTE — Progress Notes (Signed)
Confirmed with pt that he took his pre-operative ABX neomycin and E-mycin at home.  Educated pt on use of Entereg, first dose given.

## 2013-05-12 NOTE — Interval H&P Note (Signed)
History and Physical Interval Note:  05/12/2013 7:03 AM  James [name]  has presented today for surgery, with the diagnosis of colostomy   The various methods of treatment have been discussed with the patient and family. After consideration of risks, benefits and other options for treatment, the patient has consented to  Procedure(s) with comments: COLOSTOMY CLOSURE (N/A) - yellow fin stirrups  as a surgical intervention .  The patient's history has been reviewed, patient examined, no change in status, stable for surgery.  I have reviewed the patient's chart and labs.  Questions were answered to the patient's satisfaction.     Basma Buchner A.

## 2013-05-12 NOTE — Anesthesia Preprocedure Evaluation (Addendum)
Anesthesia Evaluation  Patient identified by MRN, date of birth, ID band Patient awake    Reviewed: Allergy & Precautions, H&P , NPO status , Patient's Chart, lab work & pertinent test results  Airway Mallampati: II TM Distance: <3 FB Neck ROM: Full    Dental  (+) Edentulous Upper, Edentulous Lower and Dental Advisory Given   Pulmonary shortness of breath and with exertion, sleep apnea and Continuous Positive Airway Pressure Ventilation , COPD COPD inhaler, former smoker,    Pulmonary exam normal       Cardiovascular hypertension, Pt. on medications and Pt. on home beta blockers + Peripheral Vascular Disease + dysrhythmias Atrial Fibrillation     Neuro/Psych negative neurological ROS  negative psych ROS   GI/Hepatic Neg liver ROS, GERD-  Medicated and Controlled,  Endo/Other  negative endocrine ROS  Renal/GU negative Renal ROS  negative genitourinary   Musculoskeletal negative musculoskeletal ROS (+)   Abdominal   Peds  Hematology negative hematology ROS (+)   Anesthesia Other Findings   Reproductive/Obstetrics negative OB ROS                        Anesthesia Physical Anesthesia Plan  ASA: III  Anesthesia Plan: General   Post-op Pain Management:    Induction: Intravenous  Airway Management Planned: Oral ETT  Additional Equipment:   Intra-op Plan:   Post-operative Plan: Extubation in OR  Informed Consent: I have reviewed the patients History and Physical, chart, labs and discussed the procedure including the risks, benefits and alternatives for the proposed anesthesia with the patient or authorized representative who has indicated his/her understanding and acceptance.   Dental advisory given  Plan Discussed with: CRNA, Anesthesiologist and Surgeon  Anesthesia Plan Comments:        Anesthesia Quick Evaluation

## 2013-05-12 NOTE — Brief Op Note (Signed)
05/12/2013  11:39 AM  PATIENT:  James [name]  62 y.o. male  PRE-OPERATIVE DIAGNOSIS:  colostomy   POST-OPERATIVE DIAGNOSIS:  colostomy   PROCEDURE:  Procedure(s) with comments: COLOSTOMY CLOSURE (N/A) - yellow fin stirrups   SURGEON:  Surgeon(s) and Role:    * Antonis Lor A. Appollonia Klee, MD - Primary    * Valarie Merino, MD - Assisting     ANESTHESIA:   general  EBL:  Total I/O In: 2250 [I.V.:2000; IV Piggyback:250] Out: 530 [Urine:490; Blood:40]  BLOOD ADMINISTERED:none  DRAINS: none   LOCAL MEDICATIONS USED:  NONE  SPECIMEN:  Source of Specimen:  transverse colon  DISPOSITION OF SPECIMEN:  PATHOLOGY  COUNTS:  YES  TOURNIQUET:  * No tourniquets in log *  DICTATION: .Other Dictation: Dictation Number  [id number]  PLAN OF CARE: Admit to inpatient   PATIENT DISPOSITION:  PACU - hemodynamically stable.   Delay start of Pharmacological VTE agent (>24hrs) due to surgical blood loss or risk of bleeding: no

## 2013-05-12 NOTE — Progress Notes (Signed)
Placed patient on home CPAP at 9cm with oxygen set at 4lpm. Sp02=96% with ETCO of 37 at this time.

## 2013-05-12 NOTE — Preoperative (Signed)
Beta Blockers   Reason not to administer Beta Blockers:Not Applicable 

## 2013-05-12 NOTE — Progress Notes (Signed)
Arrived to room 6n14 from pacu, a/ox4,denies nausea, c/o abd pain 5/10 encouraged to use PCA, oriented to room and surroundings, family at bedside.

## 2013-05-12 NOTE — Progress Notes (Signed)
Rt placed patient on home cpap with 4L O2 bleed in. Patient did not tolerate well and family requested that I place him back on the nasal canula. Patient alert stand stable when placed on nasal canula.

## 2013-05-13 LAB — CBC
Hemoglobin: 10.9 g/dL — ABNORMAL LOW (ref 13.0–17.0)
MCHC: 32.7 g/dL (ref 30.0–36.0)
Platelets: 332 10*3/uL (ref 150–400)
RBC: 4.02 MIL/uL — ABNORMAL LOW (ref 4.22–5.81)

## 2013-05-13 LAB — BASIC METABOLIC PANEL
Calcium: 8.6 mg/dL (ref 8.4–10.5)
GFR calc Af Amer: 83 mL/min — ABNORMAL LOW (ref >90)
GFR calc non Af Amer: 72 mL/min — ABNORMAL LOW (ref >90)
Potassium: 4.5 mEq/L (ref 3.5–5.1)
Sodium: 136 mEq/L (ref 135–145)

## 2013-05-13 MED ORDER — HYDROMORPHONE HCL PF 1 MG/ML IJ SOLN
1.0000 mg | INTRAMUSCULAR | Status: DC | PRN
  Administered 2013-05-13 (×2): 1 mg via INTRAVENOUS
  Filled 2013-05-13 (×2): qty 1

## 2013-05-13 MED ORDER — HYDROMORPHONE HCL PF 1 MG/ML IJ SOLN
INTRAMUSCULAR | Status: AC
  Filled 2013-05-13: qty 1

## 2013-05-13 MED ORDER — HYDROCODONE-ACETAMINOPHEN 5-325 MG PO TABS
1.0000 | ORAL_TABLET | ORAL | Status: DC | PRN
  Administered 2013-05-13: 2 via ORAL
  Administered 2013-05-13: 1 via ORAL
  Administered 2013-05-14 – 2013-05-19 (×8): 2 via ORAL
  Filled 2013-05-13 (×11): qty 2

## 2013-05-13 MED ORDER — HYDROMORPHONE HCL PF 1 MG/ML IJ SOLN
1.0000 mg | INTRAMUSCULAR | Status: DC | PRN
  Administered 2013-05-13 – 2013-05-19 (×28): 1 mg via INTRAVENOUS
  Filled 2013-05-13 (×29): qty 1

## 2013-05-13 MED ORDER — ACETAMINOPHEN 10 MG/ML IV SOLN
1000.0000 mg | Freq: Four times a day (QID) | INTRAVENOUS | Status: AC
  Administered 2013-05-13 – 2013-05-14 (×4): 1000 mg via INTRAVENOUS
  Filled 2013-05-13 (×5): qty 100

## 2013-05-13 NOTE — Progress Notes (Signed)
Patient placed self on CPAP, RT will continue to monitor.

## 2013-05-13 NOTE — Progress Notes (Signed)
0000 Patient and family upset that pco2 monitor constantly alarming due to patient's shallow breathing. 0010 Dr. Luisa Hart notified and PCA dilaudid discontinued. 0018 Dilaudid 1mg  given IV. 0100 Patient resting will continue to monitor.

## 2013-05-13 NOTE — Op Note (Signed)
NAMESHERARD, SUTCH               ACCOUNT NO.:  [id number]  MEDICAL RECORD NO.:  [id number]  LOCATION:  6N14C                        FACILITY:  [facility]  PHYSICIAN:  Maisie Fus A. Adalae Baysinger, M.D.DATE OF BIRTH:  20-Feb-1951  DATE OF PROCEDURE:  05/12/2013 DATE OF DISCHARGE:                              OPERATIVE REPORT   PREOPERATIVE DIAGNOSIS:  History of colostomy secondary to ischemic colitis.  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  PROCEDURE: 1. Takedown of descending colostomy with partial colectomy, resection     of distal transverse colon. 2. Creation of a side-to-end anastomosis using the Baker's technique. 3. Rigid sigmoidoscopy.  SURGEON:  Maisie Fus A. Avaiyah Strubel, MD  ASSISTANT:  [name] [place]. Daphine Deutscher, MD  ESTIMATED BLOOD LOSS:  Approximately 500 mL.  IV FLUIDS:  2400 of crystalloid.  No blood products were given.  DRAINS:  None.  SPECIMEN:  Distal transverse colon to Pathology.  INDICATIONS FOR PROCEDURE:  The patient is a 62 year old male, who 8 months ago developed ischemic colitis requiring emergency descending colectomy with colostomy.  He has recovered from all this and presents today for reversal of his colostomy.  He has developed some stenosis at his ostomy site.  Risk, benefits, and alternative therapies discussed. He received preoperative medical clearance with Cardiology due to his history of paroxysmal atrial fibrillation, hyponatremia, and these were corrected.  He understood the risks of bleeding, infection, anastomotic leak, death, sepsis, open abdomen, hernia formation, bowel injury, requiring further surgery, intra-abdominal abscess, myocardial event, cerebrovascular event, and exacerbation of underlying medical problems. I also discussed for DVT complications and multisystem organ failure. After discussion of the above, he wished to proceed.  DESCRIPTION OF PROCEDURE:  The patient was met in the  holding area. Questions were answered.  He was taken back to the operating room, where he was placed supine initially on the operating room table.  General anesthesia was initiated without difficulty.  He was placed in lithotomy and appropriately padded.  The abdomen was then exposed.  Purse-string suture of a 3-0 Vicryl was placed to close the colostomy.  Abdomen and perineum were prepped and draped in sterile fashion.  Time-out was done. He received preoperative antibiotics of gentamicin and clindamycin due to his penicillin allergy.  We then made a midline incision after time out of the old scar.  We dissected into the abdominal cavity.  He had dense intra-abdominal adhesions.  Upon entering there, I took about 2 hours to take all these down.  Part of the omentum was resected due to its size.  We then took the small bowel adhesions off the left lateral wall, pelvis to expose the rectal stump.  This was grasped with Babcock's.  At this point, I then took the colostomy down from the skin using cautery.  It was stenotic.  The entire cost was taken through the abdominal wall at this point in time.  We then began to mobilize with local of the distal transverse colon.  I then mobilized the transverse and hepatic flexure as well.  Once this was done, I elected to resect the distal transverse colon just distal to the middle colic artery. This was excised  by using the GIA-75 stapling device and ligature passed off the field to include the colostomy site.  This enabled the transverse colon to reach into the pelvis much easier.  I elected to do an end to side anastomosis using the Baker's technique and an EEA stapling device.  Pursestring was then placed to the rectal stump using the pursestring applicator using 2-0 Prolene.  I excised the staple line.  We were able to irrigate out the rectal stump since there were some retained fecal material.  We then took the staple line down from resection of  the distal transverse colon using cautery and placed the stapler through that thus pushing the spike through the antimesenteric border of the colon approximately 3 cm from the staple line.  The anvil was placed in the rectal stump and a pursestring suture was cinched down and closed.  We then connected the tube and went ahead and cranked down the stapler.  It fired easily.  Two complete donuts were noted.  We then used a TA-60 to close the common enterotomy in the colon.  I then went below with the rigid sigmoidoscope.  He had significant amount of retained stool.  It took some time to irrigate out.  I was then able to insufflate the rectum up to the anastomosis.  The anastomosis submerged and saw no evidence of leakage.  There was no undue tension on the anastomosis.  There was a large mesenteric defect.  I did not choose to close this since I thought this would be more of a problem.  Everything reaching the pelvis quite nicely.  Irrigation was used and suctioned out.  There was some oozing from 2 spots on the small intestine from the adhesiolysis and over sewed on the antimesenteric area of the bowel with 3-0 Vicryl.  Hemostasis was achieved.  The liver was normal.  I felt a very small gallstone in the gallbladder, but there was no evidence of any signs of cholecystitis or inflammation.  At this point, all instruments were found to be correct.  The fascia opening at the ostomy site was closed with interrupted #1 Novafil.  Double-stranded 0 PDS was used to close the fascia.  Skin staples used to approximate the skin and the ostomy site was packed open with saline-soaked Kerlix.  All final counts of sponge, needle, and instruments found to be correct.  The patient was awoke, extubated, taken to recovery in stable condition after being taken out of lithotomy.     Consuella Scurlock A. Eriona Kinchen, M.D.     TAC/MEDQ  D:  05/12/2013  T:  05/13/2013  Job:  [id number]

## 2013-05-13 NOTE — Progress Notes (Signed)
1 Day Post-Op  Subjective: VERY SORE  Objective: Vital signs in last 24 hours: Temp:  [96.8 F (36 C)-98.4 F (36.9 C)] 98.4 F (36.9 C) (08/13 0601) Pulse Rate:  [62-100] 83 (08/13 0601) Resp:  [13-23] 18 (08/13 0601) BP: (103-154)/(51-86) 123/86 mmHg (08/13 0601) SpO2:  [90 %-96 %] 95 % (08/13 0601) Last BM Date: 05/12/13  Intake/Output from previous day: 08/12 0701 - 08/13 0700 In: 6486.1 [P.O.:2120; I.V.:4116.1; IV Piggyback:250] Out: 1650 [Urine:1610; Blood:40] Intake/Output this shift: Total I/O In: 3556.1 [P.O.:2120; I.V.:1436.1] Out: 1075 [Urine:1075]  Incision/Wound:c/d/i  bs present soft but very sore ostomy site clean packed  Lab Results:   Recent Labs  05/12/13 1430  WBC 21.1*  HGB 11.7*  HCT 36.7*  PLT 364   BMET No results found for this basename: NA, K, CL, CO2, GLUCOSE, BUN, CREATININE, CALCIUM,  in the last 72 hours PT/INR No results found for this basename: LABPROT, INR,  in the last 72 hours ABG No results found for this basename: PHART, PCO2, PO2, HCO3,  in the last 72 hours  Studies/Results: No results found.  Anti-infectives: Anti-infectives   Start     Dose/Rate Route Frequency Ordered Stop   05/12/13 1445  ciprofloxacin (CIPRO) IVPB 400 mg     400 mg 200 mL/hr over 60 Minutes Intravenous Every 12 hours 05/12/13 1348 05/12/13 1657   05/12/13 1445  metroNIDAZOLE (FLAGYL) IVPB 500 mg     500 mg 100 mL/hr over 60 Minutes Intravenous Every 8 hours 05/12/13 1348 05/12/13 1543   05/12/13 0600  clindamycin (CLEOCIN) IVPB 900 mg     900 mg 100 mL/hr over 30 Minutes Intravenous On call to O.R. 05/11/13 1442 05/12/13 0750   05/12/13 0600  gentamicin (GARAMYCIN) 459.2 mg in dextrose 5 % 100 mL IVPB     5 mg/kg  91.8 kg 111.5 mL/hr over 60 Minutes Intravenous On call to O.R. 05/11/13 1442 05/12/13 0810   05/11/13 1443  neomycin (MYCIFRADIN) tablet 1,000 mg  Status:  Discontinued     1,000 mg Oral 3 times per day 05/11/13 1443 05/12/13 1324   05/11/13 1443  erythromycin (E-MYCIN) tablet 1,000 mg  Status:  Discontinued     1,000 mg Oral 3 times per day 05/11/13 1443 05/12/13 1324      Assessment/Plan: s/p Procedure(s) with comments: Exploratory Laparotomy, Lysis of adhesions, Colon resection, Proctoscope, COLOSTOMY CLOSURE (N/A) - yellow fin stirrups  OOB TRY CLEARS RECHECK CBC IN AM START WET TO DRY DRESSING CHANGES  LOS: 1 day    Aneth Schlagel A. 05/13/2013

## 2013-05-14 ENCOUNTER — Encounter (HOSPITAL_COMMUNITY): Payer: Self-pay | Admitting: Surgery

## 2013-05-14 ENCOUNTER — Inpatient Hospital Stay (HOSPITAL_COMMUNITY): Payer: BC Managed Care – PPO

## 2013-05-14 LAB — COMPREHENSIVE METABOLIC PANEL
ALT: 14 U/L (ref 0–53)
AST: 13 U/L (ref 0–37)
Albumin: 2.7 g/dL — ABNORMAL LOW (ref 3.5–5.2)
Alkaline Phosphatase: 93 U/L (ref 39–117)
CO2: 27 mEq/L (ref 19–32)
Chloride: 98 mEq/L (ref 96–112)
Creatinine, Ser: 1.03 mg/dL (ref 0.50–1.35)
GFR calc non Af Amer: 75 mL/min — ABNORMAL LOW (ref >90)
Potassium: 4 mEq/L (ref 3.5–5.1)
Total Bilirubin: 0.6 mg/dL (ref 0.3–1.2)

## 2013-05-14 LAB — CBC
HCT: 31.2 % — ABNORMAL LOW (ref 39.0–52.0)
MCV: 84.1 fL (ref 78.0–100.0)
Platelets: 313 10*3/uL (ref 150–400)
RBC: 3.71 MIL/uL — ABNORMAL LOW (ref 4.22–5.81)
RDW: 17 % — ABNORMAL HIGH (ref 11.5–15.5)
WBC: 20.6 10*3/uL — ABNORMAL HIGH (ref 4.0–10.5)

## 2013-05-14 MED ORDER — SODIUM CHLORIDE 0.9 % IV BOLUS (SEPSIS)
500.0000 mL | Freq: Once | INTRAVENOUS | Status: AC
  Administered 2013-05-14: 500 mL via INTRAVENOUS

## 2013-05-14 MED ORDER — GUAIFENESIN ER 600 MG PO TB12
600.0000 mg | ORAL_TABLET | Freq: Two times a day (BID) | ORAL | Status: DC
  Administered 2013-05-15 – 2013-05-19 (×9): 600 mg via ORAL
  Filled 2013-05-14 (×11): qty 1

## 2013-05-14 NOTE — Progress Notes (Signed)
Patient will not be wearing CPAP tonight. CPAP is contraindicated due to recent vomiting and nausea. RN made aware.

## 2013-05-14 NOTE — Progress Notes (Signed)
Pt voided unmeasurable amt in toilet last pm and later 100c dark amber urine in urinal.  Dr. Magnus Ivan notified of decreased urine output.  Ns bolus of 500cc started per order- to be infused over 1 hr

## 2013-05-14 NOTE — Progress Notes (Signed)
2 Days Post-Op  Subjective: Tylenol helped the pain improve.  Low UOP.  Some confusion overnight. No vomiting but no flatus.   Objective: Vital signs in last 24 hours: Temp:  [98 F (36.7 C)-99.1 F (37.3 C)] 98.2 F (36.8 C) (08/14 0619) Pulse Rate:  [65-110] 65 (08/14 0619) Resp:  [17-21] 17 (08/14 0619) BP: (95-175)/(51-85) 95/51 mmHg (08/14 0619) SpO2:  [90 %-98 %] 90 % (08/14 0619) Weight:  [201 lb 9.6 oz (91.445 kg)] 201 lb 9.6 oz (91.445 kg) (08/13 0800) Last BM Date: 05/13/13  Intake/Output from previous day: 08/13 0701 - 08/14 0700 In: 3000 [I.V.:2400; IV Piggyback:600] Out: 910 [Urine:910] Intake/Output this shift:    Resp: clear to auscultation bilaterally Cardio: regular rate and rhythm, S1, S2 normal, no murmur, click, rub or gallop Incision/Wound:C/D/I.  Ostomy site clean.  Some distension.  No BS  Lab Results:   Recent Labs  05/13/13 0545 05/14/13 0525  WBC 24.9* 20.6*  HGB 10.9* 10.1*  HCT 33.3* 31.2*  PLT 332 313   BMET  Recent Labs  05/13/13 0545  NA 136  K 4.5  CL 101  CO2 27  GLUCOSE 137*  BUN 15  CREATININE 1.07  CALCIUM 8.6   PT/INR No results found for this basename: LABPROT, INR,  in the last 72 hours ABG No results found for this basename: PHART, PCO2, PO2, HCO3,  in the last 72 hours  Studies/Results: No results found.  Anti-infectives: Anti-infectives   Start     Dose/Rate Route Frequency Ordered Stop   05/12/13 1445  ciprofloxacin (CIPRO) IVPB 400 mg     400 mg 200 mL/hr over 60 Minutes Intravenous Every 12 hours 05/12/13 1348 05/12/13 1657   05/12/13 1445  metroNIDAZOLE (FLAGYL) IVPB 500 mg     500 mg 100 mL/hr over 60 Minutes Intravenous Every 8 hours 05/12/13 1348 05/12/13 1543   05/12/13 0600  clindamycin (CLEOCIN) IVPB 900 mg     900 mg 100 mL/hr over 30 Minutes Intravenous On call to O.R. 05/11/13 1442 05/12/13 0750   05/12/13 0600  gentamicin (GARAMYCIN) 459.2 mg in dextrose 5 % 100 mL IVPB     5 mg/kg  91.8  kg 111.5 mL/hr over 60 Minutes Intravenous On call to O.R. 05/11/13 1442 05/12/13 0810   05/11/13 1443  neomycin (MYCIFRADIN) tablet 1,000 mg  Status:  Discontinued     1,000 mg Oral 3 times per day 05/11/13 1443 05/12/13 1324   05/11/13 1443  erythromycin (E-MYCIN) tablet 1,000 mg  Status:  Discontinued     1,000 mg Oral 3 times per day 05/11/13 1443 05/12/13 1324      Assessment/Plan: s/p Procedure(s) with comments: Exploratory Laparotomy, Lysis of adhesions, Colon resection, Proctoscope, COLOSTOMY CLOSURE (N/A) - yellow fin stirrups  Low urine output  Bolus Leukocytosis down a little.  Recheck in am Increase distension check KUB.  No vomiting Confusion overnight  Seems ok this am.  Follow.  On CPAP OOB   LOS: 2 days    James [name] A. 05/14/2013

## 2013-05-14 NOTE — Progress Notes (Addendum)
MD on call was notified that pt lung sounds were rhonchi and that he is coughing up greenish yellow thick sputum new orders received and will continue to monitor.  Ilean Skill LPN

## 2013-05-14 NOTE — Progress Notes (Addendum)
Called by RN to assess patient. Patient SO2 80-85% on 2L nasal canula. Breath sounds rhonchi bilaterally. Daughter says patient vomitted about 30 minutes prior. Patient placed on 50% venturi mask. Patient can follow instructions, but is not oriented to time. RN aware.

## 2013-05-15 DIAGNOSIS — K929 Disease of digestive system, unspecified: Secondary | ICD-10-CM

## 2013-05-15 DIAGNOSIS — J189 Pneumonia, unspecified organism: Secondary | ICD-10-CM

## 2013-05-15 LAB — CBC
HCT: 31 % — ABNORMAL LOW (ref 39.0–52.0)
Hemoglobin: 9.8 g/dL — ABNORMAL LOW (ref 13.0–17.0)
MCH: 26.6 pg (ref 26.0–34.0)
MCHC: 31.6 g/dL (ref 30.0–36.0)
MCV: 84 fL (ref 78.0–100.0)
RDW: 16.4 % — ABNORMAL HIGH (ref 11.5–15.5)

## 2013-05-15 LAB — COMPREHENSIVE METABOLIC PANEL
Albumin: 2.7 g/dL — ABNORMAL LOW (ref 3.5–5.2)
Alkaline Phosphatase: 93 U/L (ref 39–117)
BUN: 14 mg/dL (ref 6–23)
Calcium: 8.8 mg/dL (ref 8.4–10.5)
Creatinine, Ser: 0.84 mg/dL (ref 0.50–1.35)
GFR calc Af Amer: >90 mL/min (ref >90)
Glucose, Bld: 127 mg/dL — ABNORMAL HIGH (ref 70–99)
Potassium: 3.6 mEq/L (ref 3.5–5.1)
Total Protein: 6.3 g/dL (ref 6.0–8.3)

## 2013-05-15 MED ORDER — LEVOFLOXACIN IN D5W 750 MG/150ML IV SOLN
750.0000 mg | INTRAVENOUS | Status: DC
  Administered 2013-05-15 – 2013-05-19 (×5): 750 mg via INTRAVENOUS
  Filled 2013-05-15 (×5): qty 150

## 2013-05-15 MED ORDER — PHENOL 1.4 % MT LIQD
1.0000 | OROMUCOSAL | Status: DC | PRN
  Administered 2013-05-15: 1 via OROMUCOSAL
  Filled 2013-05-15: qty 177

## 2013-05-15 NOTE — Evaluation (Signed)
Physical Therapy Evaluation Patient Details Name: James [name] MRN: [id number] DOB: 01/10/1951 Today's Date: 05/15/2013 Time: 4098-1191 PT Time Calculation (min): 23 min  PT Assessment / Plan / Recommendation History of Present Illness  Patient is a 62 y/o male admitted for colostomy reversal, underwent Exploratory Laparotomy, Lysis of adhesions, Colon resection, Proctoscope, COLOSTOMY CLOSURE on 05/13/13 now with postop ileus and possible pneumonia.  Clinical Impression  Patient presents with decreased activity tolerance, decreased balance and knowledge of use of DME all affecting mobility.  He will benefit from skilled PT in the acute setting to maximize independence and allow return home with spouse assist.  Likely to not need follow up PT.    PT Assessment  Patient needs continued PT services    Follow Up Recommendations  No PT follow up    Does the patient have the potential to tolerate intense rehabilitation    N/A  Barriers to Discharge  None      Equipment Recommendations  None recommended by PT    Recommendations for Other Services   None  Frequency Min 3X/week    Precautions / Restrictions Precautions Precaution Comments: NG tube to suction   Pertinent Vitals/Pain 4/10 in abdomen      Mobility  Bed Mobility Bed Mobility: Rolling Right;Right Sidelying to Sit;Sitting - Scoot to Edge of Bed Rolling Right: With rail;5: Supervision Right Sidelying to Sit: 4: Min assist;With rails;HOB elevated Sitting - Scoot to Edge of Bed: 5: Supervision Details for Bed Mobility Assistance: cues for technique due to abdominal wound Transfers Transfers: Sit to Stand;Stand to Sit Sit to Stand: 4: Min assist;From bed Stand to Sit: 4: Min guard;To chair/3-in-1 Details for Transfer Assistance: cues for hand placement Ambulation/Gait Ambulation/Gait Assistance: 5: Supervision;4: Min guard Ambulation Distance (Feet): 200 Feet Assistive device: Rolling walker Ambulation/Gait  Assistance Details: no cues given, supervision for safety Gait Pattern: Step-through pattern;Decreased stride length;Trunk flexed        PT Diagnosis: Generalized weakness  PT Problem List: Decreased strength;Decreased activity tolerance;Decreased mobility PT Treatment Interventions: DME instruction;Gait training;Stair training;Therapeutic activities;Therapeutic exercise;Functional mobility training;Patient/family education     PT Goals(Current goals can be found in the care plan section) Acute Rehab PT Goals Patient Stated Goal: To return to independent PT Goal Formulation: With patient Time For Goal Achievement: 05/29/13 Potential to Achieve Goals: Good  Visit Information  Last PT Received On: 05/15/13 Assistance Needed: +1 History of Present Illness: Patient is a 62 y/o male admitted for colostomy reversal, underwent Exploratory Laparotomy, Lysis of adhesions, Colon resection, Proctoscope, COLOSTOMY CLOSURE on 05/13/13 now with postop ileus and possible pneumonia.       Prior Functioning  Home Living Family/patient expects to be discharged to:: Private residence Living Arrangements: Spouse/significant other Available Help at Discharge: Available 24 hours/day;Family Type of Home: House Home Access: Stairs to enter [employer] of Steps: 2 Entrance Stairs-Rails: None Home Layout: One level Home Equipment: Environmental consultant - 2 wheels;Cane - single point Prior Function Level of Independence: Independent Communication Communication: No difficulties Dominant Hand: Right    Cognition  Cognition Arousal/Alertness: Awake/alert Behavior During Therapy: WFL for tasks assessed/performed Overall Cognitive Status: Within Functional Limits for tasks assessed    Extremity/Trunk Assessment Lower Extremity Assessment Lower Extremity Assessment: Generalized weakness Cervical / Trunk Assessment Cervical / Trunk Assessment: Kyphotic   Balance    End of Session PT - End of  Session Equipment Utilized During Treatment: Gait belt;Oxygen Activity Tolerance: Patient tolerated treatment well Patient left: in chair;with call bell/phone within reach  GP     WYNN,CYNDI 05/15/2013, 12:12 PM Sheran Lawless, [place] [phone] 05/15/2013

## 2013-05-15 NOTE — Progress Notes (Signed)
3 Days Post-Op  Subjective: Patient had significant distention/ nausea/ vomiting yesterday. No BM or Flatus AAS - ileus CXR; LLL consolidation Started on Levaquin by Dr. Donell Beers for possible pneumonia  Objective: Vital signs in last 24 hours: Temp:  [98.1 F (36.7 C)-99 F (37.2 C)] 98.8 F (37.1 C) (08/15 0500) Pulse Rate:  [71-106] 106 (08/15 0500) Resp:  [18-20] 18 (08/15 0500) BP: (123-136)/(46-69) 125/69 mmHg (08/15 0500) SpO2:  [94 %-98 %] 95 % (08/15 0500) FiO2 (%):  [50 %] 50 % (08/14 2110) Last BM Date: 05/13/13  Intake/Output from previous day: 08/14 0701 - 08/15 0700 In: 2890 [I.V.:2890] Out: 3525 [Urine:1775; Emesis/NG output:1750] Intake/Output this shift:    General appearance: alert, cooperative, no distress and Feels much better with NG tube Resp: left basilar rhonchi Cardio: regular rate and rhythm, S1, S2 normal, no murmur, click, rub or gallop GI: soft; hypoactive bowel sounds LLQ wound - clean; no granulation yet Midline wound - clean through honeycomb dressing Lab Results:   Recent Labs  05/14/13 0525 05/15/13 0500  WBC 20.6* 17.5*  HGB 10.1* 9.8*  HCT 31.2* 31.0*  PLT 313 298   BMET  Recent Labs  05/14/13 0525 05/15/13 0500  NA 135 138  K 4.0 3.6  CL 98 98  CO2 27 30  GLUCOSE 122* 127*  BUN 15 14  CREATININE 1.03 0.84  CALCIUM 8.7 8.8   PT/INR No results found for this basename: LABPROT, INR,  in the last 72 hours ABG No results found for this basename: PHART, PCO2, PO2, HCO3,  in the last 72 hours  Studies/Results: Dg Chest 2 View  05/15/2013   *RADIOLOGY REPORT*  Clinical Data: Congestion.  CHEST - 2 VIEW  Comparison: Chest x-ray 02/11/2013.  Findings: Lung volumes are low.  Opacity at the left base may represent atelectasis and/or consolidation.  Small left pleural effusion.  Mild pulmonary venous congestion, without frank pulmonary edema.  Heart size is within normal limits.  Upper mediastinal contours are unremarkable.   Postoperative changes of right upper lobectomy are again noted.  IMPRESSION: 1.  Atelectasis and/or consolidation in the left lower lobe with small left-sided pleural effusion. 2.  Mild pulmonary venous congestion, without frank pulmonary edema. 3.  Status post right upper lobectomy.   Original Report Authenticated By: Trudie Reed, M.D.   Dg Abd 1 View  05/14/2013   CLINICAL DATA:  Postop from abdominal surgery. Abdominal pain and distention.  EXAM: ABDOMEN - 1 VIEW  COMPARISON:  11/08/2012  FINDINGS: Midline abdominal wall skin staples seen. Mild gaseous distention of several small bowel loops seen in the left abdomen as well as the colon. This is suspicious for a mild ileus. Stent again noted in the right common iliac artery.  IMPRESSION: Probable mild postop ileus.   Electronically Signed   By: Myles Rosenthal   On: 05/14/2013 15:40    Anti-infectives: Anti-infectives   Start     Dose/Rate Route Frequency Ordered Stop   05/15/13 0200  levofloxacin (LEVAQUIN) IVPB 750 mg     750 mg 100 mL/hr over 90 Minutes Intravenous Every 24 hours 05/15/13 0055     05/12/13 1445  ciprofloxacin (CIPRO) IVPB 400 mg     400 mg 200 mL/hr over 60 Minutes Intravenous Every 12 hours 05/12/13 1348 05/12/13 1657   05/12/13 1445  metroNIDAZOLE (FLAGYL) IVPB 500 mg     500 mg 100 mL/hr over 60 Minutes Intravenous Every 8 hours 05/12/13 1348 05/12/13 1543   05/12/13 0600  clindamycin (CLEOCIN) IVPB 900 mg     900 mg 100 mL/hr over 30 Minutes Intravenous On call to O.R. 05/11/13 1442 05/12/13 0750   05/12/13 0600  gentamicin (GARAMYCIN) 459.2 mg in dextrose 5 % 100 mL IVPB     5 mg/kg  91.8 kg 111.5 mL/hr over 60 Minutes Intravenous On call to O.R. 05/11/13 1442 05/12/13 0810   05/11/13 1443  neomycin (MYCIFRADIN) tablet 1,000 mg  Status:  Discontinued     1,000 mg Oral 3 times per day 05/11/13 1443 05/12/13 1324   05/11/13 1443  erythromycin (E-MYCIN) tablet 1,000 mg  Status:  Discontinued     1,000 mg Oral 3  times per day 05/11/13 1443 05/12/13 1324      Assessment/Plan: s/p Procedure(s) with comments: Exploratory Laparotomy, Lysis of adhesions, Colon resection, Proctoscope, COLOSTOMY CLOSURE (N/A) - yellow fin stirrups  Post-op ileus Left lower lobe pneumonia  NG tube, bowel rest until return of bowel function Levaquin for PNA Mobilize  LOS: 3 days    Wynelle Dreier K. 05/15/2013

## 2013-05-15 NOTE — Progress Notes (Signed)
4098 Patient still vomiting chest xray report back Dr. Donell Beers notified and told of chest xray results and the patient was vomiting and the abd xray show a mild post op ileus. Orders received for nasal gastric tube and avelox which pharmacy states they  they substitutes  levaquin for avelox. 0120 A #12 nasal gastric tube inserted in Rt nare. Patient vomited approx. while tube was being inserted of green liquid. Nasal gastric tube drain . Patient states he feels a little better. Will continue to monitor.

## 2013-05-15 NOTE — Progress Notes (Signed)
OT Cancellation Note  Patient Details Name: James [name] MRN: [id number] DOB: [birth year]   Cancelled Treatment:    Reason Eval/Treat Not Completed: Patient at procedure or test/ unavailable - will reattempt  Boykin Reaper [phone] 05/15/2013, 2:52PM

## 2013-05-15 NOTE — Progress Notes (Signed)
UR completed 

## 2013-05-15 NOTE — Progress Notes (Signed)
Patient can not wear cpap with NG tube in nose. Patient did not want to wear it while it is in place.

## 2013-05-16 NOTE — Progress Notes (Signed)
4 Days Post-Op  Subjective: Pt feeling pretty good, thirsty.  No N/V.  Ngt had 3200 our, but he ate a lot of ice yesterday.  Tolerated tube clamped for 1 hour today during medication administration.  Ambulating in the room, not through the halls yet.  +flatus, no BM yet.     Objective: Vital signs in last 24 hours: Temp:  [97.6 F (36.4 C)-98.4 F (36.9 C)] 97.6 F (36.4 C) (08/16 0523) Pulse Rate:  [81-86] 86 (08/16 0523) Resp:  [18] 18 (08/16 0523) BP: (135-165)/(70-75) 165/70 mmHg (08/16 0523) SpO2:  [94 %-97 %] 94 % (08/16 0523) Last BM Date: 05/13/13  Intake/Output from previous day: 08/15 0701 - 08/16 0700 In: 3331.3 [I.V.:3181.3; IV Piggyback:150] Out: 4300 [Urine:1100; Emesis/NG output:3200] Intake/Output this shift: Total I/O In: 0  Out: 300 [Urine:300]  PE: Gen:  Alert, NAD, pleasant Abd: Soft, mild tenderness, ND, +BS, no HSM, incisions C/D/I   Lab Results:   Recent Labs  05/14/13 0525 05/15/13 0500  WBC 20.6* 17.5*  HGB 10.1* 9.8*  HCT 31.2* 31.0*  PLT 313 298   BMET  Recent Labs  05/14/13 0525 05/15/13 0500  NA 135 138  K 4.0 3.6  CL 98 98  CO2 27 30  GLUCOSE 122* 127*  BUN 15 14  CREATININE 1.03 0.84  CALCIUM 8.7 8.8   PT/INR No results found for this basename: LABPROT, INR,  in the last 72 hours CMP     Component Value Date/Time   NA 138 05/15/2013 0500   K 3.6 05/15/2013 0500   CL 98 05/15/2013 0500   CO2 30 05/15/2013 0500   GLUCOSE 127* 05/15/2013 0500   BUN 14 05/15/2013 0500   CREATININE 0.84 05/15/2013 0500   CALCIUM 8.8 05/15/2013 0500   PROT 6.3 05/15/2013 0500   ALBUMIN 2.7* 05/15/2013 0500   AST 14 05/15/2013 0500   ALT 12 05/15/2013 0500   ALKPHOS 93 05/15/2013 0500   BILITOT 0.4 05/15/2013 0500   GFRNONAA >90 05/15/2013 0500   GFRAA >90 05/15/2013 0500   Lipase     Component Value Date/Time   LIPASE 45 11/07/2012 1220       Studies/Results: Dg Chest 2 View  05/15/2013   *RADIOLOGY REPORT*  Clinical Data: Congestion.   CHEST - 2 VIEW  Comparison: Chest x-ray 02/11/2013.  Findings: Lung volumes are low.  Opacity at the left base may represent atelectasis and/or consolidation.  Small left pleural effusion.  Mild pulmonary venous congestion, without frank pulmonary edema.  Heart size is within normal limits.  Upper mediastinal contours are unremarkable.  Postoperative changes of right upper lobectomy are again noted.  IMPRESSION: 1.  Atelectasis and/or consolidation in the left lower lobe with small left-sided pleural effusion. 2.  Mild pulmonary venous congestion, without frank pulmonary edema. 3.  Status post right upper lobectomy.   Original Report Authenticated By: Trudie Reed, M.D.   Dg Abd 1 View  05/14/2013   CLINICAL DATA:  Postop from abdominal surgery. Abdominal pain and distention.  EXAM: ABDOMEN - 1 VIEW  COMPARISON:  11/08/2012  FINDINGS: Midline abdominal wall skin staples seen. Mild gaseous distention of several small bowel loops seen in the left abdomen as well as the colon. This is suspicious for a mild ileus. Stent again noted in the right common iliac artery.  IMPRESSION: Probable mild postop ileus.   Electronically Signed   By: Myles Rosenthal   On: 05/14/2013 15:40    Anti-infectives: Anti-infectives   Start  Dose/Rate Route Frequency Ordered Stop   05/15/13 0200  levofloxacin (LEVAQUIN) IVPB 750 mg     750 mg 100 mL/hr over 90 Minutes Intravenous Every 24 hours 05/15/13 0055     05/12/13 1445  ciprofloxacin (CIPRO) IVPB 400 mg     400 mg 200 mL/hr over 60 Minutes Intravenous Every 12 hours 05/12/13 1348 05/12/13 1657   05/12/13 1445  metroNIDAZOLE (FLAGYL) IVPB 500 mg     500 mg 100 mL/hr over 60 Minutes Intravenous Every 8 hours 05/12/13 1348 05/12/13 1543   05/12/13 0600  clindamycin (CLEOCIN) IVPB 900 mg     900 mg 100 mL/hr over 30 Minutes Intravenous On call to O.R. 05/11/13 1442 05/12/13 0750   05/12/13 0600  gentamicin (GARAMYCIN) 459.2 mg in dextrose 5 % 100 mL IVPB     5 mg/kg   91.8 kg 111.5 mL/hr over 60 Minutes Intravenous On call to O.R. 05/11/13 1442 05/12/13 0810   05/11/13 1443  neomycin (MYCIFRADIN) tablet 1,000 mg  Status:  Discontinued     1,000 mg Oral 3 times per day 05/11/13 1443 05/12/13 1324   05/11/13 1443  erythromycin (E-MYCIN) tablet 1,000 mg  Status:  Discontinued     1,000 mg Oral 3 times per day 05/11/13 1443 05/12/13 1324       Assessment/Plan Exploratory Laparotomy, Lysis of adhesions, Colon resection, Proctoscope, Colostomy closure Post-op ileus  Left lower lobe pneumonia  Elevated BP - 5 home meds on board, will discuss with surgeon if additional medications needs to be started, continue to monitor  Plan: 1.  Bowel function returning, start clamping trial for 24 hours, allow sips/ice chips during clamping trial 2.  Levaquin for PNA  3.  Mobilize and IS 4.  SCD's and lovenox 5.  Hopefully can D/C NG tube tomorrow if doing well     LOS: 4 days    Aris Georgia 05/16/2013, 12:36 PM Pager: [phone]

## 2013-05-16 NOTE — Evaluation (Addendum)
Occupational Therapy Evaluation Patient Details Name: James [name] MRN: [id number] DOB: 1950/11/02 Today's Date: 05/16/2013 Time: 3086-5784 OT Time Calculation (min): 28 min  OT Assessment / Plan / Recommendation History of present illness Patient is a 62 y/o male admitted for colostomy reversal, underwent Exploratory Laparotomy, Lysis of adhesions, Colon resection, Proctoscope, COLOSTOMY CLOSURE on 05/13/13 now with postop ileus and possible pneumonia.   Clinical Impression   Patient presents with below problem list. Pt independent with ADLs, PTA. He will benefit from skilled OT in the acute setting to maximize independence and allow return home with spouse assist.     OT Assessment  Patient needs continued OT Services    Follow Up Recommendations  No OT follow up;Supervision/Assistance - 24 hour    Barriers to Discharge      Equipment Recommendations  Other (comment) (tbd)    Recommendations for Other Services    Frequency  Min 2X/week    Precautions / Restrictions Precautions Precaution Comments: NG tube to suction Restrictions Weight Bearing Restrictions: No   Pertinent Vitals/Pain Pain 9.5/10 in back. Nurse notified. Repositioned. O2 sats at 86% at end of session on RA and placed back on O2 (3 L).      ADL  Grooming: Performed;Wash/dry hands;Supervision/safety Where Assessed - Grooming: Supported standing Upper Body Bathing: Set up Where Assessed - Upper Body Bathing: Unsupported sitting Lower Body Bathing: Min guard Where Assessed - Lower Body Bathing: Supported sit to stand Upper Body Dressing: Set up Where Assessed - Upper Body Dressing: Unsupported sitting Lower Body Dressing: Min guard Where Assessed - Lower Body Dressing: Supported sit to Pharmacist, hospital: Performed;Min guard Statistician Method: Sit to Barista: Raised toilet seat with arms (or 3-in-1 over toilet) Toileting - Clothing Manipulation and Hygiene: Minimal  assistance Where Assessed - Engineer, mining and Hygiene: Sit to stand from 3-in-1 or toilet Tub/Shower Transfer Method: Not assessed Equipment Used: Gait belt;Rolling walker Transfers/Ambulation Related to ADLs: Minguard for safety. Cues not to pick walker up.  ADL Comments: Pt at overall Minguard level for LB dressing/bathing. OT did assist with clothing management of gown after toileting and also helped very minimally with hygiene.  Pt with decreased activity tolerance. O2 sats at 86% at end of session on RA and placed back on O2.    OT Diagnosis: Acute pain  OT Problem List: Decreased activity tolerance;Decreased knowledge of use of DME or AE;Cardiopulmonary status limiting activity;Pain OT Treatment Interventions: Self-care/ADL training;Energy conservation;DME and/or AE instruction;Therapeutic activities;Patient/family education;Balance training   OT Goals(Current goals can be found in the care plan section) Acute Rehab OT Goals Patient Stated Goal: to live OT Goal Formulation: With patient Time For Goal Achievement: 05/23/13 Potential to Achieve Goals: Good ADL Goals Pt Will Perform Lower Body Bathing: with modified independence;sit to/from stand Pt Will Perform Lower Body Dressing: with modified independence;sit to/from stand Pt Will Transfer to Toilet: with modified independence;regular height toilet;grab bars Pt Will Perform Toileting - Clothing Manipulation and hygiene: with modified independence;sit to/from stand Pt Will Perform Tub/Shower Transfer: Tub transfer;with supervision;ambulating (tub equipment tbd) Additional ADL Goal #1: Pt will be able to verbalize and demonstrate 3/3 energy conservation techniques.  Visit Information  Last OT Received On: 05/16/13 Assistance Needed: +1 History of Present Illness: Patient is a 62 y/o male admitted for colostomy reversal, underwent Exploratory Laparotomy, Lysis of adhesions, Colon resection, Proctoscope, COLOSTOMY  CLOSURE on 05/13/13 now with postop ileus and possible pneumonia.       Prior Functioning  Home Living Family/patient expects to be discharged to:: Private residence Living Arrangements: Spouse/significant other Available Help at Discharge: Available 24 hours/day;Family Type of Home: House Home Access: Stairs to enter [employer] of Steps: 2 Entrance Stairs-Rails: None Home Layout: One level Home Equipment: Walker - 2 wheels;Cane - single point;Grab bars - toilet Prior Function Level of Independence: Independent Communication Communication: No difficulties Dominant Hand: Right         Vision/Perception     Cognition  Cognition Arousal/Alertness: Awake/alert Behavior During Therapy: WFL for tasks assessed/performed Overall Cognitive Status: Within Functional Limits for tasks assessed    Extremity/Trunk Assessment Upper Extremity Assessment Upper Extremity Assessment: Overall WFL for tasks assessed     Mobility Bed Mobility Bed Mobility: Rolling Right;Right Sidelying to Sit;Sitting - Scoot to Edge of Bed Rolling Right: 5: Supervision;With rail Right Sidelying to Sit: 5: Supervision;With rails;HOB flat Sitting - Scoot to Edge of Bed: 5: Supervision Details for Bed Mobility Assistance: cues for technique Transfers Transfers: Sit to Stand;Stand to Sit Sit to Stand: 4: Min guard;With upper extremity assist;From bed;From chair/3-in-1 Stand to Sit: 4: Min guard;With upper extremity assist;To chair/3-in-1 Details for Transfer Assistance: cues for hand placement     Exercise     Balance     End of Session OT - End of Session Equipment Utilized During Treatment: Gait belt;Rolling walker Activity Tolerance: Patient tolerated treatment well Patient left: in chair;with call bell/phone within reach Nurse Communication: Other (comment) (pain in back )  GO     Earlie Raveling OTR/L [phone] 05/16/2013, 4:56 PM

## 2013-05-16 NOTE — Progress Notes (Signed)
Still has a fair amount of draiange coming from his NGT.  Will keep NGT clamped, but be very cautious because of the drainage.  Marta Lamas. Gae Bon, MD, FACS [phone] [facility]

## 2013-05-17 NOTE — Progress Notes (Signed)
5 Days Post-Op  Subjective: Pt feels better today, still coughing a lot but its productive.  Pt had 4 BM's and passing flatus.  Urinating well.  Ambulating OOB.  Hungry/thirsty.  Objective: Vital signs in last 24 hours: Temp:  [98.4 F (36.9 C)] 98.4 F (36.9 C) (08/17 0610) Pulse Rate:  [84] 84 (08/17 0610) Resp:  [18] 18 (08/17 0610) BP: (145-152)/(73-82) 145/82 mmHg (08/17 0610) SpO2:  [94 %-95 %] 94 % (08/17 0947) Last BM Date: 05/16/13  Intake/Output from previous day: 08/16 0701 - 08/17 0700 In: 2945 [P.O.:320; I.V.:2625] Out: 2425 [Urine:925; Emesis/NG output:1500] Intake/Output this shift: Total I/O In: 120 [P.O.:120] Out: -   PE: Gen:  Alert, NAD, pleasant Card:  RRR, no M/G/R heard Pulm:  Improved effort, a few wheezes/rhonchi heard as well as some course breath sounds, more clear than yesterday Abd: Soft, NT/ND, +BS, no HSM, incisions C/D/I   Lab Results:   Recent Labs  05/15/13 0500  WBC 17.5*  HGB 9.8*  HCT 31.0*  PLT 298   BMET  Recent Labs  05/15/13 0500  NA 138  K 3.6  CL 98  CO2 30  GLUCOSE 127*  BUN 14  CREATININE 0.84  CALCIUM 8.8   PT/INR No results found for this basename: LABPROT, INR,  in the last 72 hours CMP     Component Value Date/Time   NA 138 05/15/2013 0500   K 3.6 05/15/2013 0500   CL 98 05/15/2013 0500   CO2 30 05/15/2013 0500   GLUCOSE 127* 05/15/2013 0500   BUN 14 05/15/2013 0500   CREATININE 0.84 05/15/2013 0500   CALCIUM 8.8 05/15/2013 0500   PROT 6.3 05/15/2013 0500   ALBUMIN 2.7* 05/15/2013 0500   AST 14 05/15/2013 0500   ALT 12 05/15/2013 0500   ALKPHOS 93 05/15/2013 0500   BILITOT 0.4 05/15/2013 0500   GFRNONAA >90 05/15/2013 0500   GFRAA >90 05/15/2013 0500   Lipase     Component Value Date/Time   LIPASE 45 11/07/2012 1220       Studies/Results: No results found.  Anti-infectives: Anti-infectives   Start     Dose/Rate Route Frequency Ordered Stop   05/15/13 0200  levofloxacin (LEVAQUIN) IVPB 750 mg     750 mg 100 mL/hr over 90 Minutes Intravenous Every 24 hours 05/15/13 0055     05/12/13 1445  ciprofloxacin (CIPRO) IVPB 400 mg     400 mg 200 mL/hr over 60 Minutes Intravenous Every 12 hours 05/12/13 1348 05/12/13 1657   05/12/13 1445  metroNIDAZOLE (FLAGYL) IVPB 500 mg     500 mg 100 mL/hr over 60 Minutes Intravenous Every 8 hours 05/12/13 1348 05/12/13 1543   05/12/13 0600  clindamycin (CLEOCIN) IVPB 900 mg     900 mg 100 mL/hr over 30 Minutes Intravenous On call to O.R. 05/11/13 1442 05/12/13 0750   05/12/13 0600  gentamicin (GARAMYCIN) 459.2 mg in dextrose 5 % 100 mL IVPB     5 mg/kg  91.8 kg 111.5 mL/hr over 60 Minutes Intravenous On call to O.R. 05/11/13 1442 05/12/13 0810   05/11/13 1443  neomycin (MYCIFRADIN) tablet 1,000 mg  Status:  Discontinued     1,000 mg Oral 3 times per day 05/11/13 1443 05/12/13 1324   05/11/13 1443  erythromycin (E-MYCIN) tablet 1,000 mg  Status:  Discontinued     1,000 mg Oral 3 times per day 05/11/13 1443 05/12/13 1324       Assessment/Plan POD #5 s/p Exploratory Laparotomy, Lysis of  adhesions, Colon resection, Proctoscope, Colostomy closure  Post-op ileus  Left lower lobe pneumonia  Elevated BP - 5 home meds on board, much better today  Plan:  1. D/C Ng tube and start full liquids given multiple BM's, soft tomorrow AM if tolerating 2. Levaquin for PNA  3. Mobilize and IS  4. SCD's and lovenox  5. Remove honeycomb dressing and apply dry dressing 6. D/C tomorrow if continues to have BM's and tolerates a D3 diet     LOS: 5 days    DORT, Cosby Proby 05/17/2013, 10:12 AM Pager: [phone]

## 2013-05-17 NOTE — Progress Notes (Signed)
The patient interviewed and examined. I agree with the assessment and treatment plan outlined by Ms. Dort, PA.  Patient is clearly better. Ileus is resolving.his abdominal exam is reassuringly benign. Incision looks okay.  NG tube be discontinued and liquid diet started. Continue Levaquin for presumed pneumonia.ambulation and pulmonary toilet stressed.  Angelia Mould. Derrell Lolling, M.D., [facility] General and Minimally invasive Surgery Breast and Colorectal Surgery Office:   [phone] Pager:   [phone]

## 2013-05-18 MED ORDER — FUROSEMIDE 20 MG PO TABS
20.0000 mg | ORAL_TABLET | Freq: Every day | ORAL | Status: DC
  Administered 2013-05-18 – 2013-05-19 (×2): 20 mg via ORAL
  Filled 2013-05-18 (×3): qty 1

## 2013-05-18 NOTE — Progress Notes (Signed)
Physical Therapy Treatment Patient Details Name: James [name] MRN: [id number] DOB: 1951-06-04 Today's Date: 05/18/2013 Time: 4098-1191 PT Time Calculation (min): 15 min  PT Assessment / Plan / Recommendation  History of Present Illness Patient is a 62 y/o male admitted for colostomy reversal, underwent Exploratory Laparotomy, Lysis of adhesions, Colon resection, Proctoscope, COLOSTOMY CLOSURE on 05/13/13 now with postop ileus and possible pneumonia.   PT Comments   Pt moving well.  Ambulated without AD & was steady.  Pt eager to d/c home.  Pt safe to d/c home from mobility standpoint when MD feels medically ready.     Follow Up Recommendations  No PT follow up     Does the patient have the potential to tolerate intense rehabilitation     Barriers to Discharge        Equipment Recommendations  None recommended by PT    Recommendations for Other Services    Frequency Min 3X/week   Progress towards PT Goals Progress towards PT goals: Progressing toward goals  Plan Current plan remains appropriate    Precautions / Restrictions Restrictions Weight Bearing Restrictions: No   Pertinent Vitals/Pain C/o mild abd incisional pain    Mobility  Bed Mobility Bed Mobility: Supine to Sit;Sitting - Scoot to Edge of Bed Supine to Sit: 7: Independent;HOB flat Transfers Transfers: Sit to Stand;Stand to Sit Sit to Stand: 5: Supervision;With upper extremity assist;From bed Stand to Sit: 5: Supervision;With upper extremity assist;Other (comment) (couch by window) Details for Transfer Assistance: supervision for safety.   Ambulation/Gait Ambulation/Gait Assistance: 5: Supervision Ambulation Distance (Feet): 1000 Feet (6N unit 2x's) Assistive device: None Ambulation/Gait Assistance Details: Pt requested to ambulate without RW.  He was steady throughout.  Able to perform head turns in all directions, high stepping, & gait speed changes, & directional changes.   Gait Pattern: Within Functional  Limits Stairs: No Wheelchair Mobility Wheelchair Mobility: No       PT Goals (current goals can now be found in the care plan section) Acute Rehab PT Goals PT Goal Formulation: With patient Time For Goal Achievement: 05/29/13 Potential to Achieve Goals: Good  Visit Information  Last PT Received On: 05/18/13 Assistance Needed: +1 History of Present Illness: Patient is a 62 y/o male admitted for colostomy reversal, underwent Exploratory Laparotomy, Lysis of adhesions, Colon resection, Proctoscope, COLOSTOMY CLOSURE on 05/13/13 now with postop ileus and possible pneumonia.    Subjective Data      Cognition  Cognition Arousal/Alertness: Awake/alert Behavior During Therapy: WFL for tasks assessed/performed Overall Cognitive Status: Within Functional Limits for tasks assessed    Balance     End of Session PT - End of Session Equipment Utilized During Treatment: Gait belt Activity Tolerance: Patient tolerated treatment well Patient left: with family/visitor present (couch by windown with wife present) Nurse Communication: Mobility status   GP     Lara Mulch 05/18/2013, 11:47 AM   Verdell Face, PTA [phone] 05/18/2013

## 2013-05-18 NOTE — Progress Notes (Signed)
Occupational Therapy Treatment Patient Details Name: James [name] MRN: [id number] DOB: 1951-05-31 Today's Date: 05/18/2013 Time: 3086-5784 OT Time Calculation (min): 21 min  OT Assessment / Plan / Recommendation  History of present illness Patient is a 62 y/o male admitted for colostomy reversal, underwent Exploratory Laparotomy, Lysis of adhesions, Colon resection, Proctoscope, COLOSTOMY CLOSURE on 05/13/13 now with postop ileus and possible pneumonia.   OT comments  Pt is set up assist to modified independent with all BADLs.  No further OT needed, will sign off at this time.  All goals achieved  Follow Up Recommendations  Supervision - Intermittent    Barriers to Discharge       Equipment Recommendations  None recommended by OT    Recommendations for Other Services    Frequency     Progress towards OT Goals Progress towards OT goals: Goals met/education completed, patient discharged from OT  Plan All goals met and education completed, patient discharged from OT services    Precautions / Restrictions     Pertinent Vitals/Pain     ADL  Grooming: Modified independent;Wash/dry hands;Wash/dry face;Teeth care;Brushing hair;Shaving Where Assessed - Grooming: Unsupported standing Upper Body Bathing: Set up Where Assessed - Upper Body Bathing: Unsupported sitting Lower Body Bathing: Set up Where Assessed - Lower Body Bathing: Unsupported sit to stand Upper Body Dressing: Set up Where Assessed - Upper Body Dressing: Unsupported sitting Lower Body Dressing: Set up Where Assessed - Lower Body Dressing: Unsupported sit to stand Toilet Transfer: Modified independent Toilet Transfer Method: Sit to stand;Stand pivot Acupuncturist: Bedside commode Toileting - Clothing Manipulation and Hygiene: Modified independent Where Assessed - Engineer, mining and Hygiene: Standing Tub/Shower Transfer: Modified independent Tub/Shower Transfer Method:  Ambulating Transfers/Ambulation Related to ADLs: modified independent ADL Comments: Pt able to perform all BADLs without assist.  Able to simulate shower in standing with no LOB    OT Diagnosis:    OT Problem List:   OT Treatment Interventions:     OT Goals(current goals can now be found in the care plan section) ADL Goals Pt Will Perform Lower Body Bathing:  (requires set up only due to hospital environment)  Visit Information  Last OT Received On: 05/18/13 Assistance Needed: +1 History of Present Illness: Patient is a 62 y/o male admitted for colostomy reversal, underwent Exploratory Laparotomy, Lysis of adhesions, Colon resection, Proctoscope, COLOSTOMY CLOSURE on 05/13/13 now with postop ileus and possible pneumonia.    Subjective Data      Prior Functioning       Cognition  Cognition Arousal/Alertness: Awake/alert Behavior During Therapy: WFL for tasks assessed/performed Overall Cognitive Status: Within Functional Limits for tasks assessed    Mobility  Bed Mobility Bed Mobility: Not assessed Transfers Transfers: Sit to Stand;Stand to Sit Sit to Stand: 6: Modified independent (Device/Increase time) Stand to Sit: 6: Modified independent (Device/Increase time)    Exercises      Balance     End of Session OT - End of Session Activity Tolerance: Patient tolerated treatment well Patient left: in chair;with call bell/phone within reach Nurse Communication: Mobility status  GO     James [name] M 05/18/2013, 3:04 PM

## 2013-05-18 NOTE — Progress Notes (Signed)
Occupational Therapy Discharge Patient Details Name: KAMERAN MCNEESE MRN: [id number] DOB: [birth year] Today's Date: 05/18/2013 Time: 7829-5621 OT Time Calculation (min): 21 min  Patient discharged from OT services secondary to goals met and no further OT needs identified.  Please see latest therapy progress note for current level of functioning and progress toward goals.    Progress and discharge plan discussed with patient and/or caregiver: Patient/Caregiver agrees with plan  GO     Boykin Reaper [phone] 05/18/2013, 3:05 PM

## 2013-05-18 NOTE — Progress Notes (Signed)
Pt. Has home CPAP unit in room. Not set up.  Pt. States he only uses it every other night.  He states he will not be wearing CPAP tonight.  I offered the hospital equipment. Pt. Refused. Encouraged to call for help if needed. RN aware.

## 2013-05-18 NOTE — Progress Notes (Signed)
6 Days Post-Op  Subjective: Moving bowels.  Breathing ok.  Objective: Vital signs in last 24 hours: Temp:  [98 F (36.7 C)-98.8 F (37.1 C)] 98 F (36.7 C) (08/18 0626) Pulse Rate:  [61-67] 67 (08/18 0626) Resp:  [16-18] 18 (08/18 0626) BP: (137-146)/(57-64) 146/64 mmHg (08/18 0626) SpO2:  [94 %-98 %] 98 % (08/18 0626) Last BM Date: 05/17/13  Intake/Output from previous day: 08/17 0701 - 08/18 0700 In: 3659.2 [P.O.:480; I.V.:3179.2] Out: 875 [Urine:700; Emesis/NG output:175] Intake/Output this shift: Total I/O In: 1856.3 [I.V.:1856.3] Out: 300 [Urine:300]  Resp: clear to auscultation bilaterally Cardio: regular rate and rhythm, S1, S2 normal, no murmur, click, rub or gallop Incision/Wound:clean dry intact. Packed wound clean.  Soft BS present  Lab Results:  No results found for this basename: WBC, HGB, HCT, PLT,  in the last 72 hours BMET No results found for this basename: NA, K, CL, CO2, GLUCOSE, BUN, CREATININE, CALCIUM,  in the last 72 hours PT/INR No results found for this basename: LABPROT, INR,  in the last 72 hours ABG No results found for this basename: PHART, PCO2, PO2, HCO3,  in the last 72 hours  Studies/Results: No results found.  Anti-infectives: Anti-infectives   Start     Dose/Rate Route Frequency Ordered Stop   05/15/13 0200  levofloxacin (LEVAQUIN) IVPB 750 mg     750 mg 100 mL/hr over 90 Minutes Intravenous Every 24 hours 05/15/13 0055     05/12/13 1445  ciprofloxacin (CIPRO) IVPB 400 mg     400 mg 200 mL/hr over 60 Minutes Intravenous Every 12 hours 05/12/13 1348 05/12/13 1657   05/12/13 1445  metroNIDAZOLE (FLAGYL) IVPB 500 mg     500 mg 100 mL/hr over 60 Minutes Intravenous Every 8 hours 05/12/13 1348 05/12/13 1543   05/12/13 0600  clindamycin (CLEOCIN) IVPB 900 mg     900 mg 100 mL/hr over 30 Minutes Intravenous On call to O.R. 05/11/13 1442 05/12/13 0750   05/12/13 0600  gentamicin (GARAMYCIN) 459.2 mg in dextrose 5 % 100 mL IVPB     5  mg/kg  91.8 kg 111.5 mL/hr over 60 Minutes Intravenous On call to O.R. 05/11/13 1442 05/12/13 0810   05/11/13 1443  neomycin (MYCIFRADIN) tablet 1,000 mg  Status:  Discontinued     1,000 mg Oral 3 times per day 05/11/13 1443 05/12/13 1324   05/11/13 1443  erythromycin (E-MYCIN) tablet 1,000 mg  Status:  Discontinued     1,000 mg Oral 3 times per day 05/11/13 1443 05/12/13 1324      Assessment/Plan: s/p Procedure(s) with comments: Exploratory Laparotomy, Lysis of adhesions, Colon resection, Proctoscope, COLOSTOMY CLOSURE (N/A) - yellow fin stirrups  Wean O2 Wean IVF OOB On soft diet Lasix 20 mg   LOS: 6 days    James [name] A. 05/18/2013

## 2013-05-19 ENCOUNTER — Telehealth (INDEPENDENT_AMBULATORY_CARE_PROVIDER_SITE_OTHER): Payer: Self-pay

## 2013-05-19 ENCOUNTER — Other Ambulatory Visit (INDEPENDENT_AMBULATORY_CARE_PROVIDER_SITE_OTHER): Payer: Self-pay

## 2013-05-19 DIAGNOSIS — Z9889 Other specified postprocedural states: Secondary | ICD-10-CM

## 2013-05-19 MED ORDER — LEVOFLOXACIN 750 MG PO TABS: 750.0000 mg | ORAL_TABLET | Freq: Every day | ORAL | Status: DC

## 2013-05-19 MED ORDER — OXYCODONE-ACETAMINOPHEN 5-325 MG PO TABS: 1.0000 | ORAL_TABLET | ORAL | Status: DC | PRN

## 2013-05-19 MED ORDER — POLYETHYLENE GLYCOL 3350 17 GM/SCOOP PO POWD: 17.0000 g | Freq: Every day | ORAL | Status: DC

## 2013-05-19 NOTE — Progress Notes (Signed)
7 Days Post-Op  Subjective: Off oxygen moving bowels tolerating diet.  Objective: Vital signs in last 24 hours: Temp:  [97.9 F (36.6 C)-98.8 F (37.1 C)] 97.9 F (36.6 C) (08/19 0559) Pulse Rate:  [54-70] 54 (08/19 0559) Resp:  [16] 16 (08/19 0559) BP: (123-138)/(54-65) 123/54 mmHg (08/19 0559) SpO2:  [95 %-98 %] 97 % (08/19 0559) Last BM Date: 05/18/13  Intake/Output from previous day: 08/18 0701 - 08/19 0700 In: 1190 [P.O.:240; I.V.:800; IV Piggyback:150] Out: -  Intake/Output this shift: Total I/O In: 950 [I.V.:800; IV Piggyback:150] Out: -   Incision/Wound:clean dry intact.  Soft.  LLQ wound clean.  Lab Results:  No results found for this basename: WBC, HGB, HCT, PLT,  in the last 72 hours BMET No results found for this basename: NA, K, CL, CO2, GLUCOSE, BUN, CREATININE, CALCIUM,  in the last 72 hours PT/INR No results found for this basename: LABPROT, INR,  in the last 72 hours ABG No results found for this basename: PHART, PCO2, PO2, HCO3,  in the last 72 hours  Studies/Results: No results found.  Anti-infectives: Anti-infectives   Start     Dose/Rate Route Frequency Ordered Stop   05/15/13 0200  levofloxacin (LEVAQUIN) IVPB 750 mg     750 mg 100 mL/hr over 90 Minutes Intravenous Every 24 hours 05/15/13 0055     05/12/13 1445  ciprofloxacin (CIPRO) IVPB 400 mg     400 mg 200 mL/hr over 60 Minutes Intravenous Every 12 hours 05/12/13 1348 05/12/13 1657   05/12/13 1445  metroNIDAZOLE (FLAGYL) IVPB 500 mg     500 mg 100 mL/hr over 60 Minutes Intravenous Every 8 hours 05/12/13 1348 05/12/13 1543   05/12/13 0600  clindamycin (CLEOCIN) IVPB 900 mg     900 mg 100 mL/hr over 30 Minutes Intravenous On call to O.R. 05/11/13 1442 05/12/13 0750   05/12/13 0600  gentamicin (GARAMYCIN) 459.2 mg in dextrose 5 % 100 mL IVPB     5 mg/kg  91.8 kg 111.5 mL/hr over 60 Minutes Intravenous On call to O.R. 05/11/13 1442 05/12/13 0810   05/11/13 1443  neomycin (MYCIFRADIN)  tablet 1,000 mg  Status:  Discontinued     1,000 mg Oral 3 times per day 05/11/13 1443 05/12/13 1324   05/11/13 1443  erythromycin (E-MYCIN) tablet 1,000 mg  Status:  Discontinued     1,000 mg Oral 3 times per day 05/11/13 1443 05/12/13 1324      Assessment/Plan: s/p Procedure(s) with comments: Exploratory Laparotomy, Lysis of adhesions, Colon resection, Proctoscope, COLOSTOMY CLOSURE (N/A) - yellow fin stirrups  Discharge  LOS: 7 days    Deklen Popelka A. 05/19/2013

## 2013-05-19 NOTE — Discharge Summary (Signed)
Physician Discharge Summary  Patient ID: James [name] MRN: [id number] DOB/AGE: 64-Sep-1952 64 y.o.  Admit date: 05/12/2013 Discharge date: 05/19/2013  Admission Diagnoses:  Discharge Diagnoses:  Active Problems:   Ischemic colitis   Discharged Condition: good  Hospital Course:Pt developed ileus post op that resolved by day 5.  Atelectasis vs pneumonia on CXR treated with levfloxacin.  Diet slowly advanced.  Bowels moving and tolerate diet well.  Wounds clean.    Consults: None  Significant Diagnostic Studies: labs: see chart  Treatments: surgery: colostomy closure Discharge Exam: Blood pressure 123/54, pulse 54, temperature 97.9 F (36.6 C), temperature source Oral, resp. rate 16, height 6\' 2"  (1.88 m), weight 201 lb 9.6 oz (91.445 kg), SpO2 97.00%. General appearance: alert and cooperative Lungs clear CV RRR Abdomen soft non distended.  Wounds clean.  Disposition: 01-Home or Self Care  Discharge Orders   Future Orders Complete By Expires   Diet - low sodium heart healthy  As directed    Discharge instructions  As directed    Comments:     Will set up [facility] for wound care.  Ok to shower.   Discharge wound care:  As directed    Comments:     Wet to tdry daily to ostomy site.  Will have office set up HHN.   Driving Restrictions  As directed    Comments:     No driving for 2 weeks   Increase activity slowly  As directed    Lifting restrictions  As directed    Comments:     No lifting for 4 weeks       Medication List    STOP taking these medications       HYDROcodone-acetaminophen 10-325 MG per tablet  Commonly known as:  NORCO     metoprolol tartrate 25 MG tablet  Commonly known as:  LOPRESSOR      TAKE these medications       ADVIL PM 200-25 MG Caps  Generic drug:  Ibuprofen-Diphenhydramine HCl  Take 2 tablets by mouth at bedtime.     albuterol 108 (90 BASE) MCG/ACT inhaler  Commonly known as:  PROVENTIL HFA;VENTOLIN HFA  Inhale 2 puffs into the  lungs every 6 (six) hours as needed for wheezing or shortness of breath. For shortness of breath     aspirin 81 MG tablet  Take 81 mg by mouth daily.     budesonide-formoterol 160-4.5 MCG/ACT inhaler  Commonly known as:  SYMBICORT  Inhale 2 puffs into the lungs 2 (two) times daily.     diltiazem 240 MG 24 hr capsule  Commonly known as:  TIAZAC  Take 240 mg by mouth 2 (two) times daily.     finasteride 5 MG tablet  Commonly known as:  PROSCAR  Take 5 mg by mouth daily.     fish oil-omega-3 fatty acids 1000 MG capsule  Take 2 g by mouth 2 (two) times daily.     lisinopril-hydrochlorothiazide 20-12.5 MG per tablet  Commonly known as:  PRINZIDE,ZESTORETIC  Take 1 tablet by mouth daily.     omeprazole 20 MG tablet  Commonly known as:  PRILOSEC OTC  Take 20 mg by mouth 2 (two) times daily as needed (Acid reflux).     OVER THE COUNTER MEDICATION  Take 1 tablet by mouth daily. Insync probiotic     oxyCODONE-acetaminophen 5-325 MG per tablet  Commonly known as:  ROXICET  Take 1 tablet by mouth every 4 (four) hours as needed for pain.  oxyCODONE-acetaminophen 5-325 MG per tablet  Commonly known as:  ROXICET  Take 1 tablet by mouth every 4 (four) hours as needed for pain.     polyethylene glycol powder powder  Commonly known as:  GLYCOLAX  Take 17 g by mouth daily.     pravastatin 40 MG tablet  Commonly known as:  PRAVACHOL  Take 20 mg by mouth at bedtime.     tiotropium 18 MCG inhalation capsule  Commonly known as:  SPIRIVA  Place 18 mcg into inhaler and inhale daily.     traMADol 50 MG tablet  Commonly known as:  ULTRAM  Take 50 mg by mouth daily as needed for pain.         Signed: Fantasha Daniele A. 05/19/2013, 6:40 AM

## 2013-05-19 NOTE — Telephone Encounter (Signed)
Message copied by Brennan Bailey on Tue May 19, 2013  8:55 AM ------      Message from: Harriette Bouillon A      Created: Tue May 19, 2013  6:38 AM       NEED WET TO DRY DRESSING CHANGE TO ABD WOUND DAILY.  PLEASE SET UP.  ------

## 2013-05-19 NOTE — Telephone Encounter (Signed)
I called and spoke with patients wife and let her know I have tried every home health agency and no one at this time has available nurses to go out and provide wound care for dressing changes. I am still waiting on one agency to call me back. I let her know she may need to bring him up here to have US show her how to do these dressings if no one is available. She will call me in the morning and check on the status of home health and make nurse visit if needed.

## 2013-05-19 NOTE — Telephone Encounter (Signed)
Faxed home health request to [facility] [phone]

## 2013-05-19 NOTE — Progress Notes (Signed)
Discharge instructions reviewed with pt and prescriptions given.  Pt verbalized understanding and questions answered.  Pt discharged in stable condition with wife.  James [name]

## 2013-05-29 ENCOUNTER — Encounter (INDEPENDENT_AMBULATORY_CARE_PROVIDER_SITE_OTHER): Payer: Self-pay | Admitting: Surgery

## 2013-05-29 ENCOUNTER — Ambulatory Visit (INDEPENDENT_AMBULATORY_CARE_PROVIDER_SITE_OTHER): Payer: BC Managed Care – PPO | Admitting: Surgery

## 2013-05-29 VITALS — BP 138/62 | HR 88 | Temp 98.6°F | Resp 15 | Ht 74.0 in | Wt 192.8 lb

## 2013-05-29 DIAGNOSIS — Z9889 Other specified postprocedural states: Secondary | ICD-10-CM

## 2013-05-29 NOTE — Patient Instructions (Signed)
Continue wound care.  Return 1 month.

## 2013-05-29 NOTE — Progress Notes (Signed)
The patient returns 2 weeks after colostomy closure. He is doing well. His pain is well controlled and he is tolerating his diet. His bowels are moving.  Exam: Midline incision healing well. Left lower quadrant open wound clean. This is being packed.  Impression: Status post colostomy closure doing well.  Plan: Return in one month. Continue present wound care. No other changes for now.

## 2013-06-15 ENCOUNTER — Encounter (INDEPENDENT_AMBULATORY_CARE_PROVIDER_SITE_OTHER): Payer: Self-pay | Admitting: General Surgery

## 2013-06-15 ENCOUNTER — Ambulatory Visit (INDEPENDENT_AMBULATORY_CARE_PROVIDER_SITE_OTHER): Payer: BC Managed Care – PPO | Admitting: Surgery

## 2013-06-15 VITALS — BP 130/70 | HR 76 | Resp 14 | Ht 74.0 in | Wt 193.2 lb

## 2013-06-15 DIAGNOSIS — Z5189 Encounter for other specified aftercare: Secondary | ICD-10-CM

## 2013-06-15 MED ORDER — CIPROFLOXACIN HCL 500 MG PO TABS: 500.0000 mg | ORAL_TABLET | Freq: Two times a day (BID) | ORAL | Status: AC

## 2013-06-15 NOTE — Progress Notes (Signed)
Patient returns in followup after colostomy takedown due to some redness and a small drainage which started yesterday from the inferior portion of his incision. No fever chills. Pain is minimal. Drainage is cloudy. Minimal in quantity.  Exam: Lower incision has a 2 mm opening that probed with a Q-tip. Small cavity noted. Small amount of cloudy drainage noted. Quarter-inch packing of iodoform placed. Minimal erythema.  Impression: Small wound infection noted. Start Cipro 500 mg by mouth twice a day for 7 days. Remove packing in 48 hours and shower. Cover it with dry dressing daily. Keep followup appointment for next week.

## 2013-06-15 NOTE — Patient Instructions (Signed)
Remove packing on wed.  Cover with dry gauze and change daily.  Keep follow up next week. ABX for 1 week.

## 2013-06-26 ENCOUNTER — Encounter (INDEPENDENT_AMBULATORY_CARE_PROVIDER_SITE_OTHER): Payer: Self-pay | Admitting: Surgery

## 2013-06-26 ENCOUNTER — Ambulatory Visit (INDEPENDENT_AMBULATORY_CARE_PROVIDER_SITE_OTHER): Payer: BC Managed Care – PPO | Admitting: Surgery

## 2013-06-26 VITALS — BP 125/81 | HR 66 | Temp 98.1°F | Resp 12 | Ht 74.0 in | Wt 195.2 lb

## 2013-06-26 DIAGNOSIS — Z9889 Other specified postprocedural states: Secondary | ICD-10-CM

## 2013-06-26 NOTE — Patient Instructions (Signed)
Resume full activity. Return as needed.  

## 2013-06-26 NOTE — Progress Notes (Signed)
The patient returns 7 weeks after colostomy closure. He is doing well. His pain is well controlled and he is tolerating his diet. His bowels are moving. He had a superficial wound infection and wound open 10 days ago.  He has finished antibiotics.  He is doing well.   Exam: Midline incision healed.ostomy site almost closed.     Impression: Status post colostomy closure doing well.  Plan: Return as needed. Full activity as tolerated.

## 2013-08-06 ENCOUNTER — Other Ambulatory Visit: Payer: Self-pay

## 2013-12-08 IMAGING — CR DG CHEST 2V
2 series · 2 of 2 positions shown · non-contrast
Comparison: 11/10/2012

CLINICAL DATA: Preop colostomy closure.  History of lung cancer.

CHEST - 2 VIEW

[w chest pa]
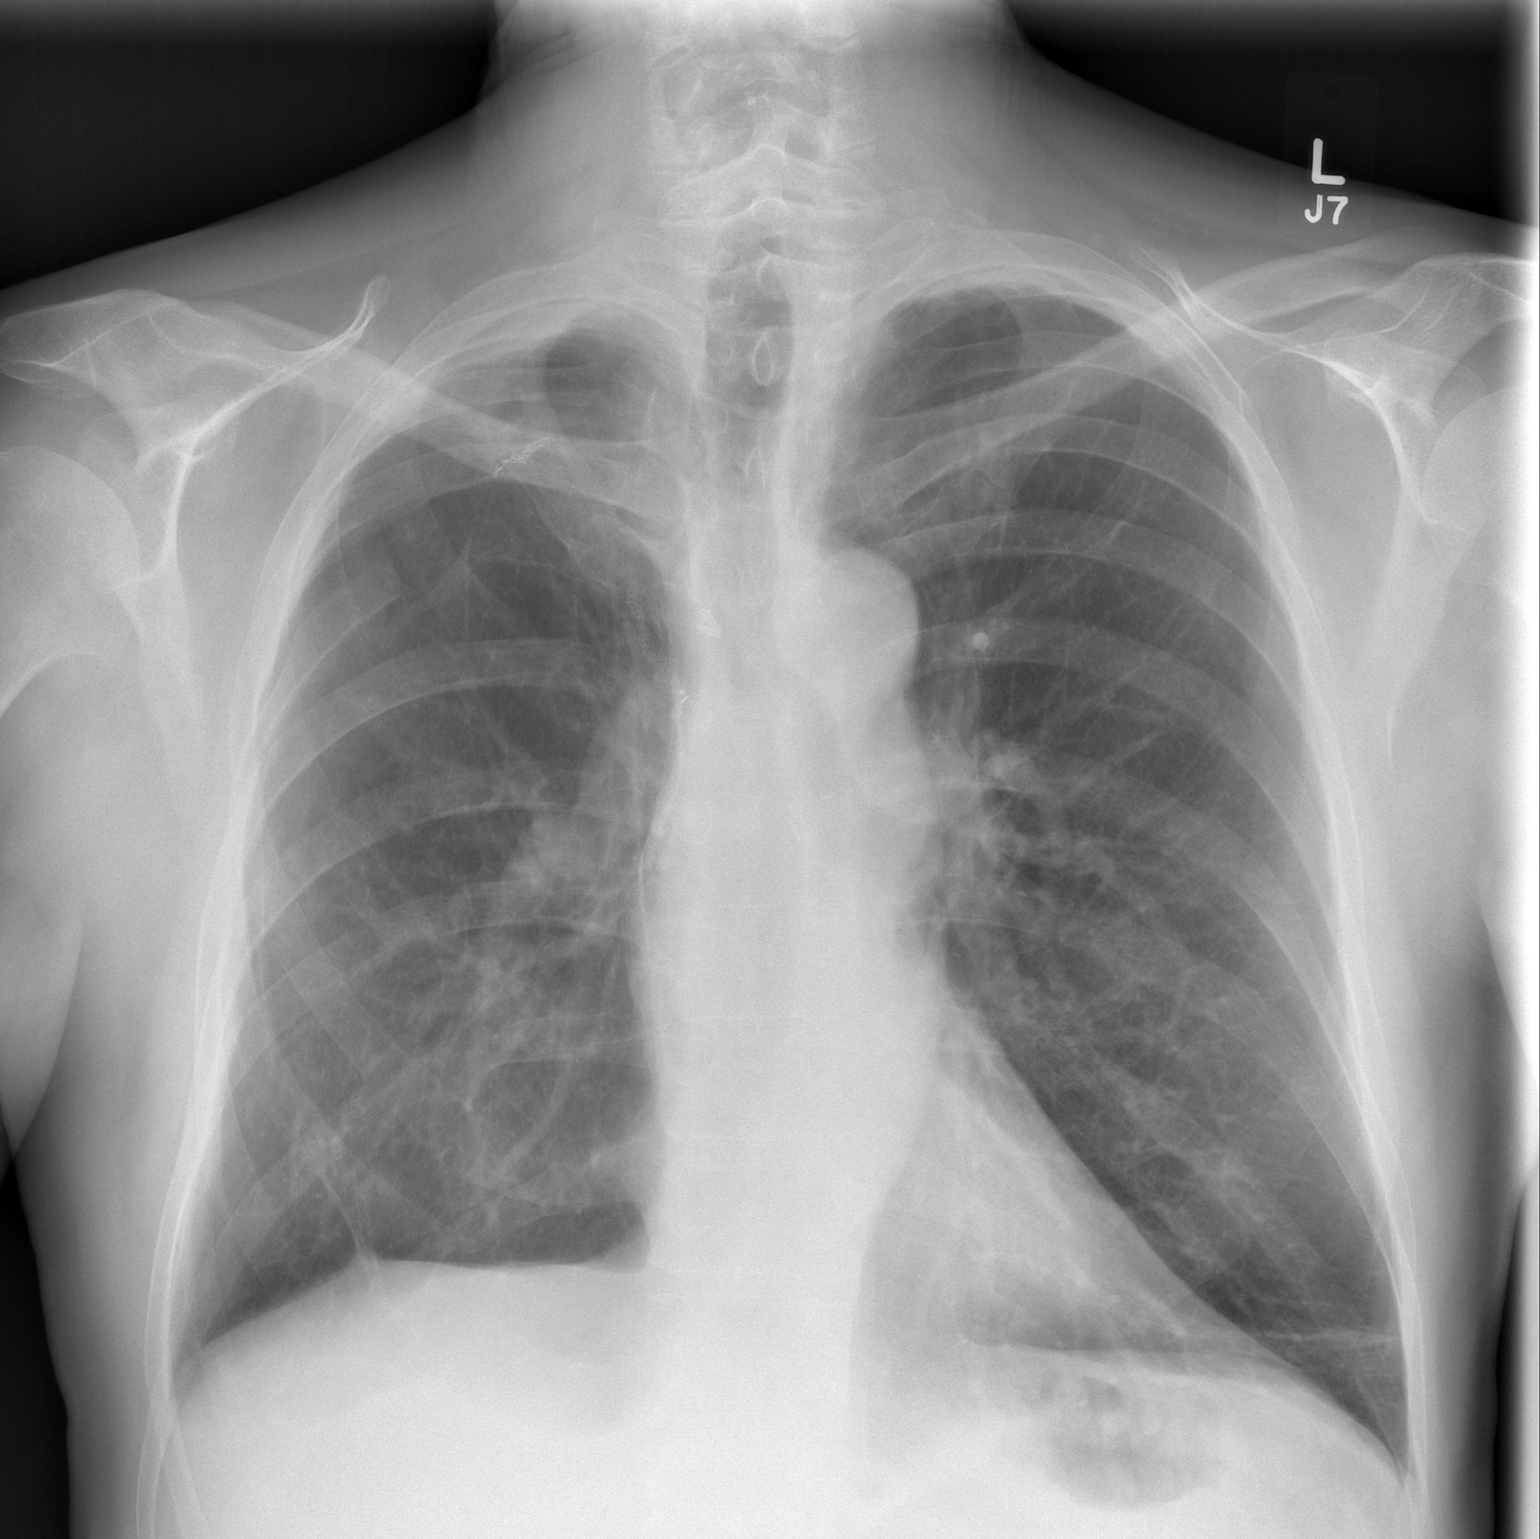

[w chest lat]
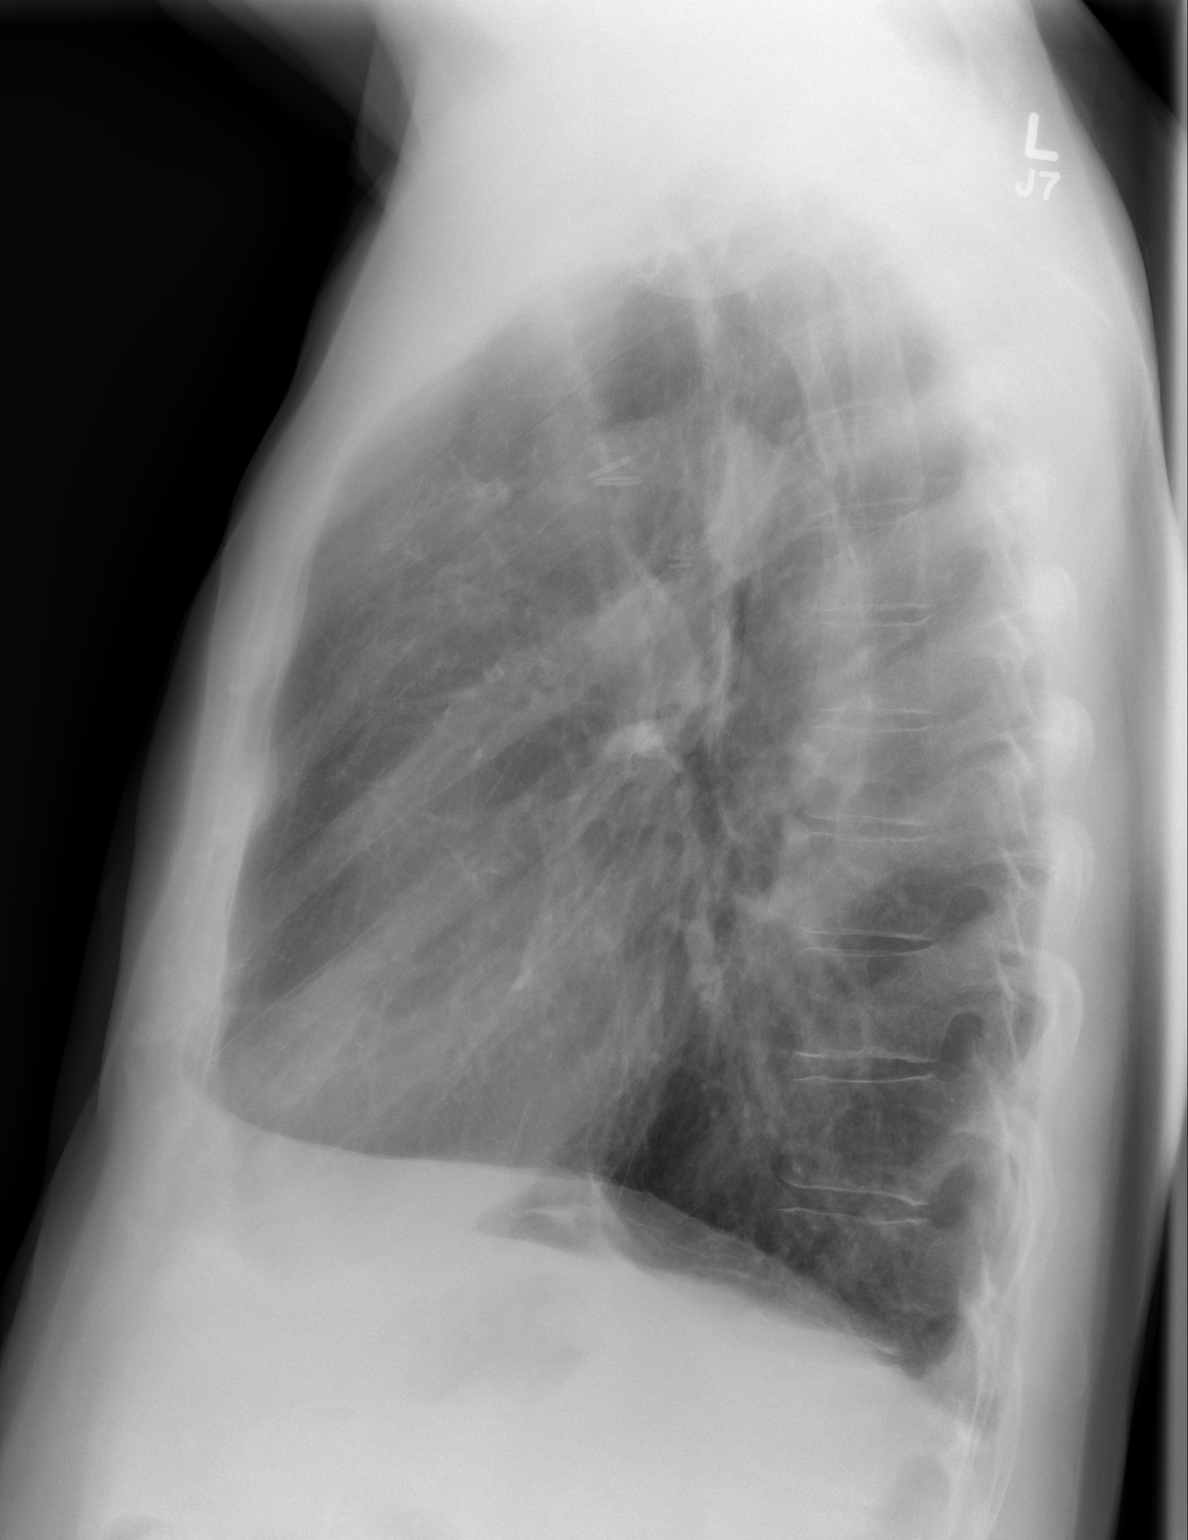

[2 of 2 positions shown; findings below may reference images not displayed]

FINDINGS: Heart size is normal.  No pleural effusion or edema
identified.  There is postoperative change noted within the right
upper lobe.  Scarring is noted in both lung bases.  No superimposed
airspace consolidation identified.
IMPRESSION: 1.  No acute cardiopulmonary abnormalities.

## 2014-03-10 IMAGING — CR DG ABDOMEN 1V
2 series · 2 of 2 positions shown · non-contrast
Comparison: 11/08/2012

CLINICAL DATA: Postop from abdominal surgery. Abdominal pain and
distention.

EXAM:
ABDOMEN - 1 VIEW

[x abdomen supine (1 of 2)]
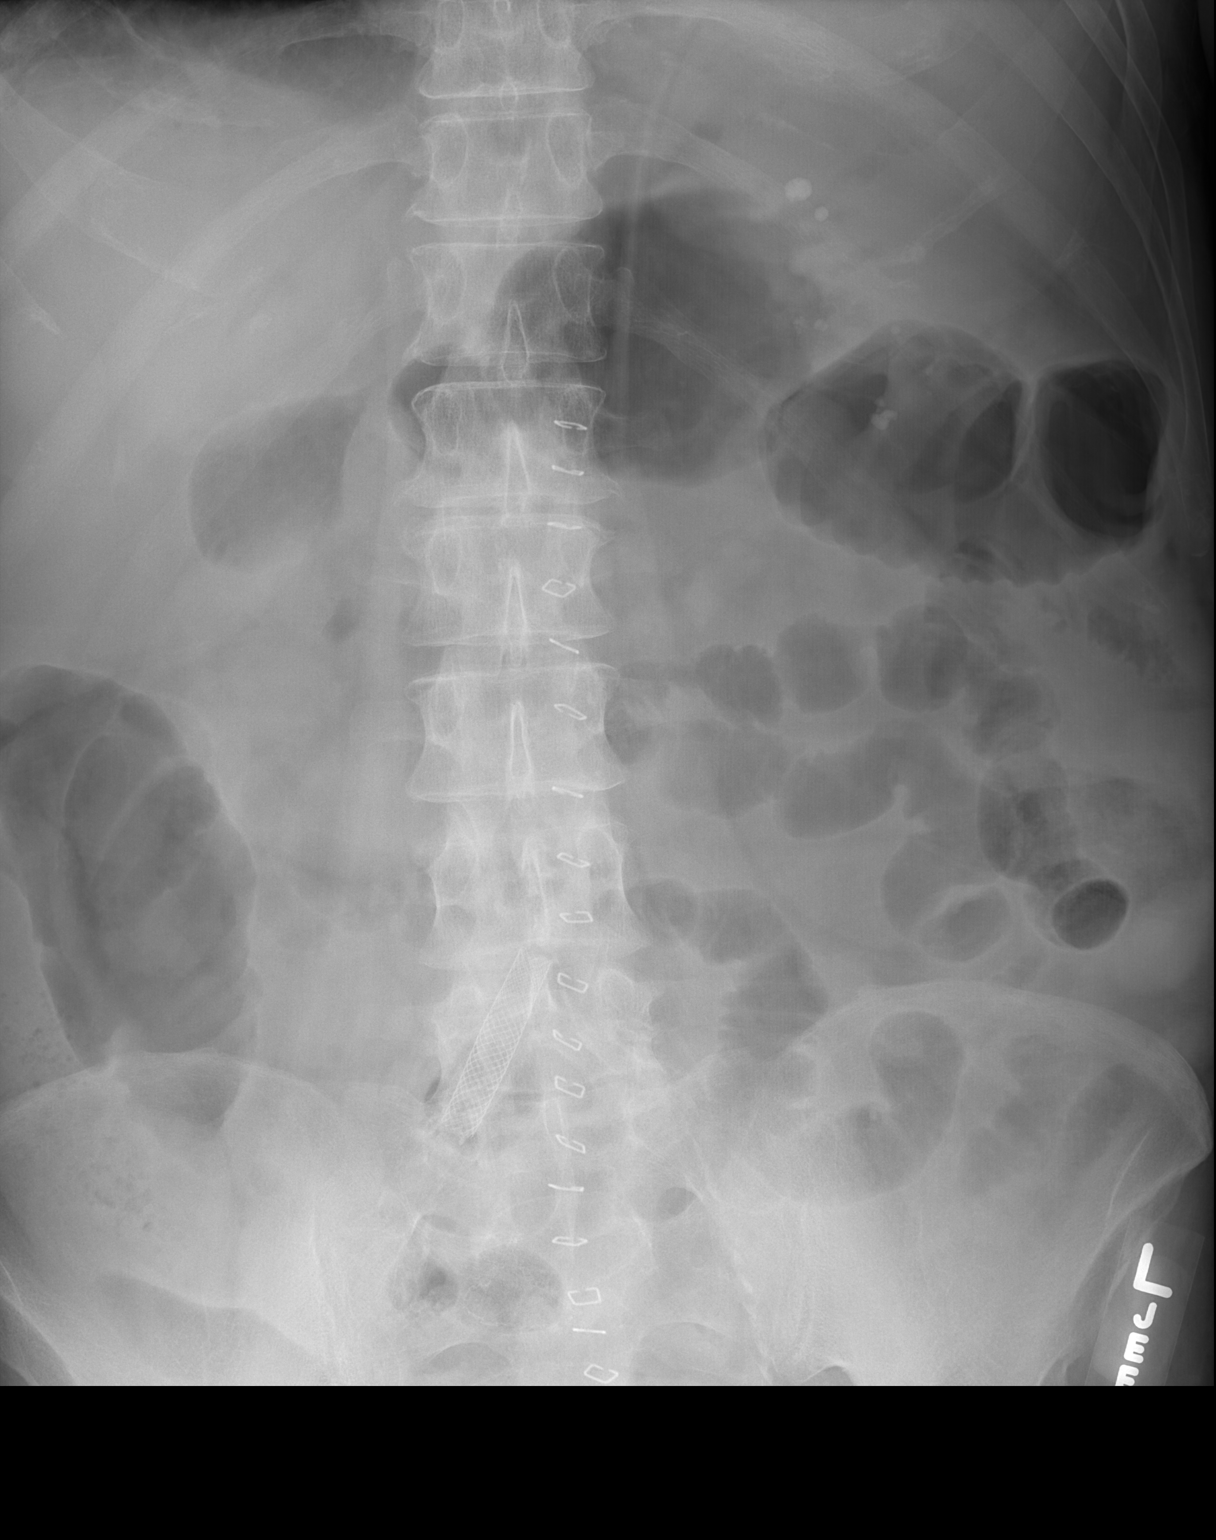

[x abdomen supine (2 of 2)]
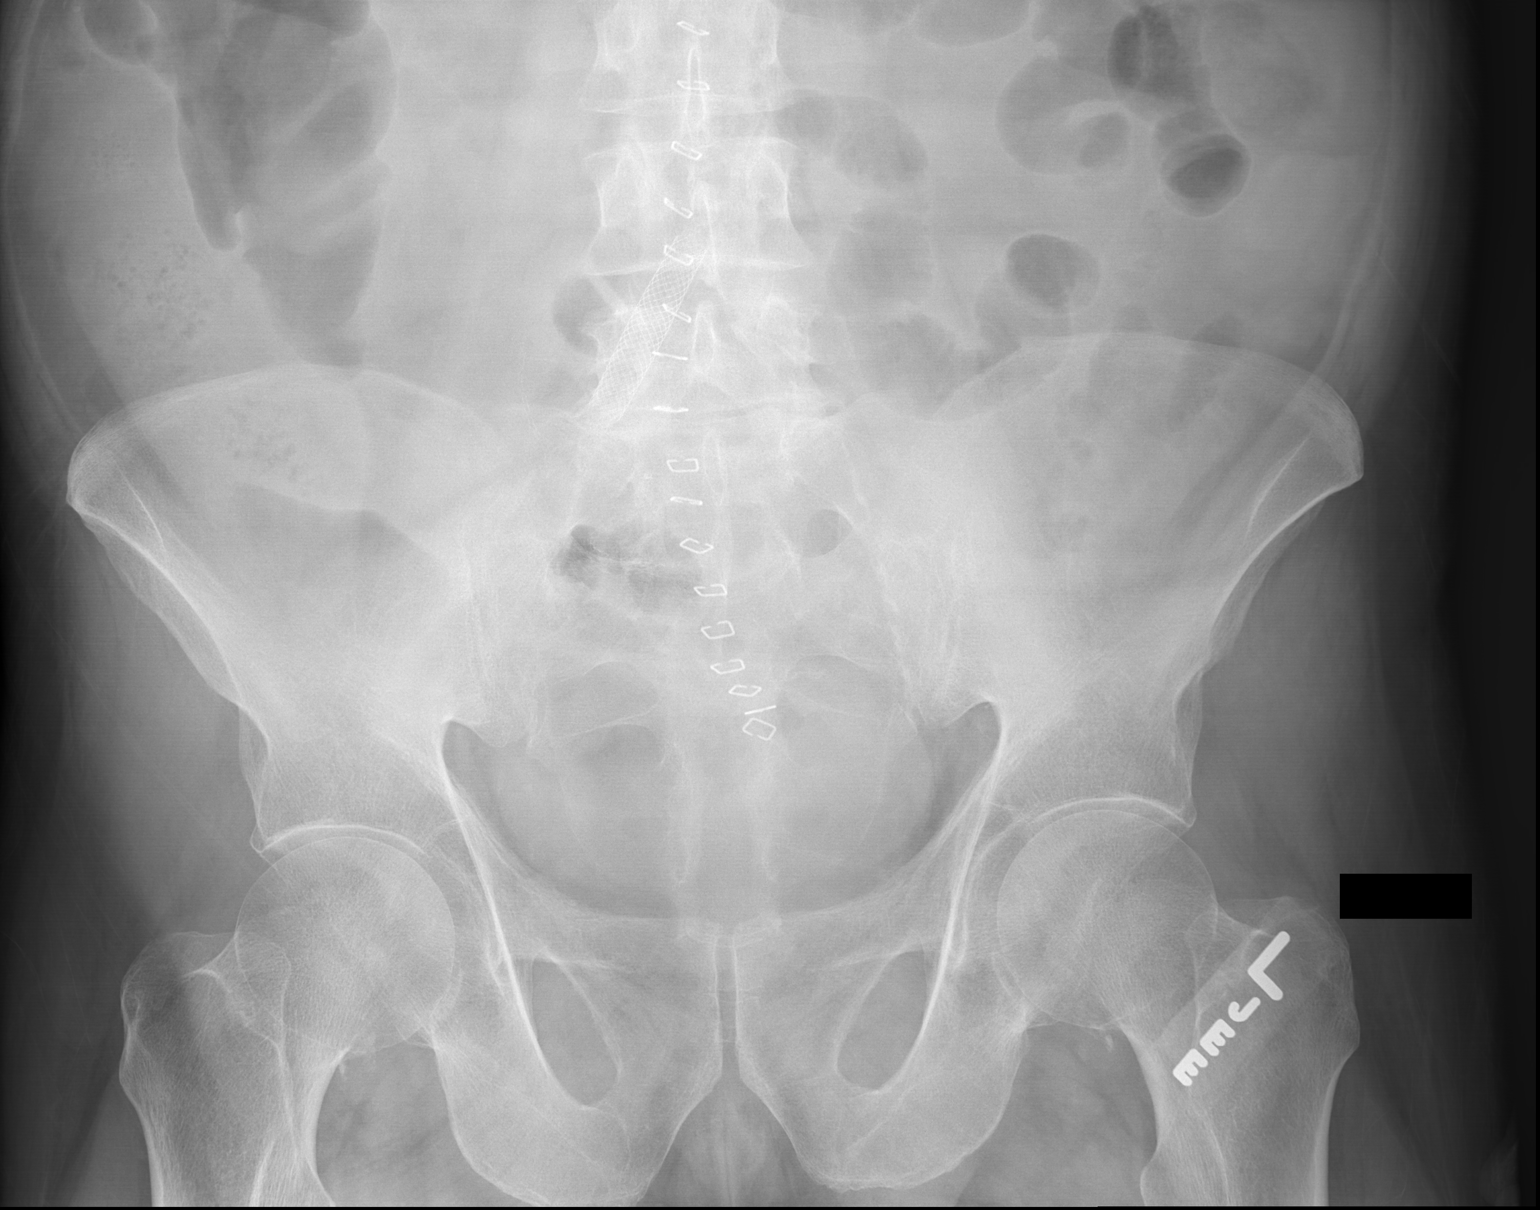

[2 of 2 positions shown; findings below may reference images not displayed]

FINDINGS: Midline abdominal wall skin staples seen. Mild gaseous distention of
several small bowel loops seen in the left abdomen as well as the
colon. This is suspicious for a mild ileus. Stent again noted in the
right common iliac artery.
IMPRESSION: Probable mild postop ileus.

## 2014-10-14 ENCOUNTER — Encounter (HOSPITAL_COMMUNITY): Payer: Self-pay

## 2014-10-14 ENCOUNTER — Emergency Department (HOSPITAL_COMMUNITY)
Admission: EM | Admit: 2014-10-14 | Discharge: 2014-10-14 | Discharge status: Against Medical Advice | Payer: Medicare PPO | Attending: Emergency Medicine | Admitting: Emergency Medicine

## 2014-10-14 DIAGNOSIS — Z79899 Other long term (current) drug therapy: Secondary | ICD-10-CM | POA: Diagnosis not present

## 2014-10-14 DIAGNOSIS — Z87891 Personal history of nicotine dependence: Secondary | ICD-10-CM | POA: Insufficient documentation

## 2014-10-14 DIAGNOSIS — I10 Essential (primary) hypertension: Secondary | ICD-10-CM | POA: Insufficient documentation

## 2014-10-14 DIAGNOSIS — J449 Chronic obstructive pulmonary disease, unspecified: Secondary | ICD-10-CM | POA: Insufficient documentation

## 2014-10-14 DIAGNOSIS — G4733 Obstructive sleep apnea (adult) (pediatric): Secondary | ICD-10-CM | POA: Diagnosis not present

## 2014-10-14 DIAGNOSIS — E780 Pure hypercholesterolemia: Secondary | ICD-10-CM | POA: Insufficient documentation

## 2014-10-14 DIAGNOSIS — R630 Anorexia: Secondary | ICD-10-CM | POA: Diagnosis not present

## 2014-10-14 DIAGNOSIS — R112 Nausea with vomiting, unspecified: Secondary | ICD-10-CM | POA: Insufficient documentation

## 2014-10-14 DIAGNOSIS — Z85118 Personal history of other malignant neoplasm of bronchus and lung: Secondary | ICD-10-CM | POA: Diagnosis not present

## 2014-10-14 DIAGNOSIS — R197 Diarrhea, unspecified: Secondary | ICD-10-CM | POA: Insufficient documentation

## 2014-10-14 DIAGNOSIS — Z880 Allergy status to penicillin: Secondary | ICD-10-CM | POA: Insufficient documentation

## 2014-10-14 DIAGNOSIS — G8929 Other chronic pain: Secondary | ICD-10-CM | POA: Insufficient documentation

## 2014-10-14 DIAGNOSIS — K219 Gastro-esophageal reflux disease without esophagitis: Secondary | ICD-10-CM | POA: Diagnosis not present

## 2014-10-14 DIAGNOSIS — N400 Enlarged prostate without lower urinary tract symptoms: Secondary | ICD-10-CM | POA: Insufficient documentation

## 2014-10-14 DIAGNOSIS — R1032 Left lower quadrant pain: Secondary | ICD-10-CM | POA: Diagnosis present

## 2014-10-14 DIAGNOSIS — Z7982 Long term (current) use of aspirin: Secondary | ICD-10-CM | POA: Insufficient documentation

## 2014-10-14 LAB — COMPREHENSIVE METABOLIC PANEL
ALK PHOS: 117 U/L (ref 39–117)
ALT: 27 U/L (ref 0–53)
AST: 27 U/L (ref 0–37)
Albumin: 3.9 g/dL (ref 3.5–5.2)
Anion gap: 11 (ref 5–15)
BUN: 19 mg/dL (ref 6–23)
CO2: 28 mmol/L (ref 19–32)
Calcium: 8.8 mg/dL (ref 8.4–10.5)
Chloride: 95 mEq/L — ABNORMAL LOW (ref 96–112)
Creatinine, Ser: 1.47 mg/dL — ABNORMAL HIGH (ref 0.50–1.35)
GFR calc non Af Amer: 48 mL/min — ABNORMAL LOW (ref >90)
GFR, EST AFRICAN AMERICAN: 56 mL/min — AB (ref >90)
Glucose, Bld: 112 mg/dL — ABNORMAL HIGH (ref 70–99)
Potassium: 3.3 mmol/L — ABNORMAL LOW (ref 3.5–5.1)
Sodium: 134 mmol/L — ABNORMAL LOW (ref 135–145)
Total Bilirubin: 0.9 mg/dL (ref 0.3–1.2)
Total Protein: 7.7 g/dL (ref 6.0–8.3)

## 2014-10-14 LAB — CBC WITH DIFFERENTIAL/PLATELET
BASOS PCT: 0 % (ref 0–1)
Basophils Absolute: 0 10*3/uL (ref 0.0–0.1)
Eosinophils Absolute: 0.2 10*3/uL (ref 0.0–0.7)
Eosinophils Relative: 2 % (ref 0–5)
HEMATOCRIT: 41.7 % (ref 39.0–52.0)
Hemoglobin: 13.7 g/dL (ref 13.0–17.0)
LYMPHS ABS: 3.1 10*3/uL (ref 0.7–4.0)
LYMPHS PCT: 25 % (ref 12–46)
MCH: 28.1 pg (ref 26.0–34.0)
MCHC: 32.9 g/dL (ref 30.0–36.0)
MCV: 85.6 fL (ref 78.0–100.0)
MONO ABS: 1.2 10*3/uL — AB (ref 0.1–1.0)
Monocytes Relative: 9 % (ref 3–12)
NEUTROS ABS: 8.1 10*3/uL — AB (ref 1.7–7.7)
Neutrophils Relative %: 64 % (ref 43–77)
PLATELETS: 350 10*3/uL (ref 150–400)
RBC: 4.87 MIL/uL (ref 4.22–5.81)
RDW: 15 % (ref 11.5–15.5)
WBC: 12.6 10*3/uL — AB (ref 4.0–10.5)

## 2014-10-14 LAB — LIPASE, BLOOD: Lipase: 23 U/L (ref 11–59)

## 2014-10-14 MED ORDER — ONDANSETRON HCL 4 MG/2ML IJ SOLN
4.0000 mg | Freq: Once | INTRAMUSCULAR | Status: AC
  Administered 2014-10-14: 4 mg via INTRAVENOUS
  Filled 2014-10-14: qty 2

## 2014-10-14 MED ORDER — SUCCINYLCHOLINE CHLORIDE 20 MG/ML IJ SOLN
INTRAMUSCULAR | Status: AC
  Filled 2014-10-14: qty 1

## 2014-10-14 MED ORDER — LIDOCAINE HCL (CARDIAC) 20 MG/ML IV SOLN
INTRAVENOUS | Status: AC
  Filled 2014-10-14: qty 5

## 2014-10-14 MED ORDER — ETOMIDATE 2 MG/ML IV SOLN
INTRAVENOUS | Status: AC
  Filled 2014-10-14: qty 20

## 2014-10-14 MED ORDER — ROCURONIUM BROMIDE 50 MG/5ML IV SOLN
INTRAVENOUS | Status: AC
  Filled 2014-10-14: qty 2

## 2014-10-14 MED ORDER — SODIUM CHLORIDE 0.9 % IV BOLUS (SEPSIS)
1000.0000 mL | Freq: Once | INTRAVENOUS | Status: AC
  Administered 2014-10-14: 1000 mL via INTRAVENOUS

## 2014-10-14 NOTE — Discharge Instructions (Signed)

## 2014-10-14 NOTE — ED Provider Notes (Signed)
CSN: [id number]     Arrival date & time 10/14/14  1232 History   First MD Initiated Contact with Patient 10/14/14 1614     Chief Complaint  Patient presents with  . Abdominal Pain     (Consider location/radiation/quality/duration/timing/severity/associated sxs/prior Treatment) Patient is a 64 y.o. male presenting with abdominal pain.  Abdominal Pain Pain location:  LLQ Pain quality: sharp   Pain radiates to:  Does not radiate Pain severity:  Severe Onset quality:  Gradual Duration:  3 days Timing:  Constant Progression:  Resolved Chronicity:  Recurrent Context comment:  Prior ischemic colitis Relieved by:  Nothing Worsened by:  Nothing tried Ineffective treatments:  None tried Associated symptoms: anorexia, diarrhea, nausea and vomiting   Associated symptoms: no chills, no constipation, no dysuria, no fever, no hematemesis and no hematochezia     Past Medical History  Diagnosis Date  . COPD (chronic obstructive pulmonary disease)   . Hypertension   . High cholesterol   . Exposure to Agent Orange     [place]  . Colitis   . GERD (gastroesophageal reflux disease)   . BPH (benign prostatic hypertrophy)   . Aorta aneurysm     4.2 ascending thoracic aortic aneursym without evidence of abdominal aortic aneurysm on CTA 11/07/12  . Chronic back pain started in 1990's  . PAD (peripheral artery disease)   . Aortic aneurysm     "unrepaired; has it checked q yr" (05/12/2013)  . Cancer of lung     s/p resection in 2005 at [facility] / [place] patient  . Exertional shortness of breath   . OSA on CPAP   . Chronic lower back pain    Past Surgical History  Procedure Laterality Date  . Lung lobectomy Left 2005    "upper lobe; bronchial tube replaced w/piece of lining of his rib" (05/12/2013)  . Laparotomy N/A 11/08/2012    Procedure: EXPLORATORY LAPAROTOMY with descending and sigmoid colectomy; wound vac placement and colostomy;  Surgeon: Joyice Faster. Cornett, MD;  Location: [place] OR;  Service:  General;  Laterality: N/A;  . Femoral artery stent Right ?2002  . Colon surgery    . Colostomy  11/08/2012  . Colostomy reversal  05/12/2013  . Colostomy closure N/A 05/12/2013    Procedure: Exploratory Laparotomy, Lysis of adhesions, Colon resection, Proctoscope, COLOSTOMY CLOSURE;  Surgeon: Joyice Faster. Cornett, MD;  Location: [facility];  Service: General;  Laterality: N/A;  yellow fin stirrups    Family History  Problem Relation Age of Onset  . Heart attack Mother   . Tuberculosis Mother   . Heart attack Father   . Stroke Father   . Heart attack Brother   . Heart disease Sister   . Cancer Brother     Prostate   History  Substance Use Topics  . Smoking status: Former Smoker -- 1.00 packs/day for 30 years    Types: Cigarettes    Quit date: 10/02/2003  . Smokeless tobacco: Current User    Types: Chew  . Alcohol Use: No    Review of Systems  Constitutional: Negative for fever and chills.  Gastrointestinal: Positive for nausea, vomiting, abdominal pain, diarrhea and anorexia. Negative for constipation, hematochezia and hematemesis.  Genitourinary: Negative for dysuria.  All other systems reviewed and are negative.     Allergies  Amlodipine besy-benazepril hcl; Meloxicam; and Penicillins  Home Medications   Prior to Admission medications   Medication Sig Start Date End Date Taking? Authorizing Provider  albuterol (PROVENTIL HFA;VENTOLIN HFA) 108 (  90 BASE) MCG/ACT inhaler Inhale 2 puffs into the lungs every 6 (six) hours as needed for wheezing or shortness of breath. For shortness of breath   Yes Historical Provider, MD  aspirin 81 MG tablet Take 81 mg by mouth daily.   Yes Historical Provider, MD  budesonide-formoterol (SYMBICORT) 160-4.5 MCG/ACT inhaler Inhale 2 puffs into the lungs 2 (two) times daily.   Yes Historical Provider, MD  diltiazem (TIAZAC) 240 MG 24 hr capsule Take 240 mg by mouth 2 (two) times daily.   Yes Historical Provider, MD  finasteride (PROSCAR) 5 MG tablet  Take 5 mg by mouth daily.   Yes Historical Provider, MD  fish oil-omega-3 fatty acids 1000 MG capsule Take 2 g by mouth 2 (two) times daily.   Yes Historical Provider, MD  HYDROcodone-acetaminophen (NORCO) 10-325 MG per tablet Take 1 tablet by mouth every 6 (six) hours as needed.   Yes Historical Provider, MD  Ibuprofen-Diphenhydramine HCl (ADVIL PM) 200-25 MG CAPS Take 2 tablets by mouth at bedtime.    Yes Historical Provider, MD  lisinopril-hydrochlorothiazide (PRINZIDE,ZESTORETIC) 20-12.5 MG per tablet Take 1 tablet by mouth daily. 02/19/13  Yes Historical Provider, MD  omeprazole (PRILOSEC OTC) 20 MG tablet Take 20 mg by mouth 2 (two) times daily as needed (Acid reflux).    Yes Historical Provider, MD  OVER THE COUNTER MEDICATION Take 1 tablet by mouth daily. Insync probiotic   Yes Historical Provider, MD  polyethylene glycol powder (GLYCOLAX/MIRALAX) powder  05/19/13  Yes Historical Provider, MD  pravastatin (PRAVACHOL) 40 MG tablet Take 20 mg by mouth at bedtime.  10/31/12  Yes Historical Provider, MD  tiotropium (SPIRIVA) 18 MCG inhalation capsule Place 18 mcg into inhaler and inhale daily.   Yes Historical Provider, MD  traMADol (ULTRAM) 50 MG tablet Take 50 mg by mouth daily as needed for pain.  02/24/13  Yes Historical Provider, MD  levofloxacin (LEVAQUIN) 750 MG tablet Take 1 tablet (750 mg total) by mouth daily. 05/19/13   Erroll Luna, MD  oxyCODONE-acetaminophen (ROXICET) 5-325 MG per tablet Take 1 tablet by mouth every 4 (four) hours as needed for pain. 05/19/13   Erroll Luna, MD   BP 138/79 mmHg  Pulse 86  Temp(Src) 97.7 F (36.5 C) (Oral)  Resp 18  Ht 6\' 3"  (1.905 m)  Wt 215 lb (97.523 kg)  BMI 26.87 kg/m2  SpO2 94% Physical Exam  Constitutional: He is oriented to person, place, and time. He appears well-developed and well-nourished.  HENT:  Head: Normocephalic and atraumatic.  Eyes: Conjunctivae and EOM are normal.  Neck: Normal range of motion. Neck supple.   Cardiovascular: Normal rate, regular rhythm and normal heart sounds.   Pulmonary/Chest: Effort normal and breath sounds normal. No respiratory distress.  Abdominal: He exhibits no distension. There is no tenderness. There is no rebound and no guarding.  Musculoskeletal: Normal range of motion.  Neurological: He is alert and oriented to person, place, and time.  Skin: Skin is warm and dry.  Vitals reviewed.   ED Course  Procedures (including critical care time) Labs Review Labs Reviewed  CBC WITH DIFFERENTIAL - Abnormal; Notable for the following:    WBC 12.6 (*)    Neutro Abs 8.1 (*)    Monocytes Absolute 1.2 (*)    All other components within normal limits  COMPREHENSIVE METABOLIC PANEL - Abnormal; Notable for the following:    Sodium 134 (*)    Potassium 3.3 (*)    Chloride 95 (*)    Glucose,  Bld 112 (*)    Creatinine, Ser 1.47 (*)    GFR calc non Af Amer 48 (*)    GFR calc Af Amer 56 (*)    All other components within normal limits  LIPASE, BLOOD    Imaging Review No results found.   EKG Interpretation None      MDM   Final diagnoses:  LLQ abdominal pain    64 y.o. male with pertinent PMH of ischemic colitis sp partial colectomy, copd presents with recurrent left lower quadrant pain as described above. No fevers, however patient has had nausea and vomiting and diarrhea. Symptoms resolved shortly prior to my examination. On arrival vital signs and physical exam as above. Patient in no tenderness at time of my exam.  Prior to ordered CT scan, pt requested to leave.  He was polite and we had a thorough and detailed discussions on the risks and my concerns/differential for his symptoms including risk of morbidity and death.  He continued to request to leave and repeated the risks back to me.  Feel he is competent to make this decision.  DC home AMA.    I have reviewed all laboratory and imaging studies if ordered as above  1. LLQ abdominal pain         Debby Freiberg, MD 10/14/14 [phone]

## 2014-10-14 NOTE — ED Notes (Signed)
DR. Colin Rhein made aware of delay in CT angio; Dr. Colin Rhein to consult pt. On the risks and benefits of diagnostics tests; pt. Verbalizes understanding.

## 2014-10-14 NOTE — ED Notes (Signed)
Pt from home with left lower quadrant abdominal pain.  Sts on Monday he started throwing up and has continued to throw up until last night.  Denies any emesis today.  Sts he has been having intermittent left sided abdominal described as sharp in nature since.  Currently not nauseated in triage nor having pain.

## 2014-10-14 NOTE — ED Notes (Signed)
Dr. Colin Rhein made aware of resp. Status.

## 2015-10-02 DIAGNOSIS — C229 Malignant neoplasm of liver, not specified as primary or secondary: Secondary | ICD-10-CM

## 2015-10-02 HISTORY — DX: Malignant neoplasm of liver, not specified as primary or secondary: C22.9

## 2016-01-30 ENCOUNTER — Encounter (HOSPITAL_COMMUNITY): Payer: Self-pay | Admitting: Emergency Medicine

## 2016-01-30 ENCOUNTER — Emergency Department (HOSPITAL_COMMUNITY): Payer: Medicare Other

## 2016-01-30 ENCOUNTER — Inpatient Hospital Stay (HOSPITAL_COMMUNITY)
Admission: EM | Admit: 2016-01-30 | Discharge: 2016-01-30 | DRG: 390 | Discharge status: Against Medical Advice | Payer: Medicare Other | Attending: Internal Medicine | Admitting: Internal Medicine

## 2016-01-30 DIAGNOSIS — Z85118 Personal history of other malignant neoplasm of bronchus and lung: Secondary | ICD-10-CM

## 2016-01-30 DIAGNOSIS — K5660 Unspecified intestinal obstruction: Secondary | ICD-10-CM | POA: Diagnosis present

## 2016-01-30 DIAGNOSIS — I719 Aortic aneurysm of unspecified site, without rupture: Secondary | ICD-10-CM | POA: Diagnosis present

## 2016-01-30 DIAGNOSIS — I10 Essential (primary) hypertension: Secondary | ICD-10-CM | POA: Diagnosis present

## 2016-01-30 DIAGNOSIS — K56609 Unspecified intestinal obstruction, unspecified as to partial versus complete obstruction: Secondary | ICD-10-CM | POA: Diagnosis present

## 2016-01-30 DIAGNOSIS — N400 Enlarged prostate without lower urinary tract symptoms: Secondary | ICD-10-CM | POA: Diagnosis present

## 2016-01-30 DIAGNOSIS — I739 Peripheral vascular disease, unspecified: Secondary | ICD-10-CM | POA: Diagnosis present

## 2016-01-30 DIAGNOSIS — J449 Chronic obstructive pulmonary disease, unspecified: Secondary | ICD-10-CM | POA: Diagnosis present

## 2016-01-30 DIAGNOSIS — M545 Low back pain: Secondary | ICD-10-CM | POA: Diagnosis present

## 2016-01-30 DIAGNOSIS — K219 Gastro-esophageal reflux disease without esophagitis: Secondary | ICD-10-CM | POA: Diagnosis present

## 2016-01-30 DIAGNOSIS — I4891 Unspecified atrial fibrillation: Secondary | ICD-10-CM | POA: Diagnosis present

## 2016-01-30 DIAGNOSIS — K565 Intestinal adhesions [bands], unspecified as to partial versus complete obstruction: Secondary | ICD-10-CM

## 2016-01-30 DIAGNOSIS — Z87891 Personal history of nicotine dependence: Secondary | ICD-10-CM

## 2016-01-30 DIAGNOSIS — R1032 Left lower quadrant pain: Secondary | ICD-10-CM | POA: Diagnosis present

## 2016-01-30 DIAGNOSIS — E7800 Pure hypercholesterolemia, unspecified: Secondary | ICD-10-CM | POA: Diagnosis present

## 2016-01-30 DIAGNOSIS — Z7982 Long term (current) use of aspirin: Secondary | ICD-10-CM | POA: Diagnosis not present

## 2016-01-30 DIAGNOSIS — G4733 Obstructive sleep apnea (adult) (pediatric): Secondary | ICD-10-CM | POA: Diagnosis present

## 2016-01-30 DIAGNOSIS — Z79899 Other long term (current) drug therapy: Secondary | ICD-10-CM | POA: Diagnosis not present

## 2016-01-30 DIAGNOSIS — E860 Dehydration: Secondary | ICD-10-CM | POA: Diagnosis present

## 2016-01-30 DIAGNOSIS — G8929 Other chronic pain: Secondary | ICD-10-CM | POA: Diagnosis present

## 2016-01-30 DIAGNOSIS — R160 Hepatomegaly, not elsewhere classified: Secondary | ICD-10-CM

## 2016-01-30 LAB — CBC
HCT: 47.3 % (ref 39.0–52.0)
Hemoglobin: 15.7 g/dL (ref 13.0–17.0)
MCH: 28.9 pg (ref 26.0–34.0)
MCHC: 33.2 g/dL (ref 30.0–36.0)
MCV: 86.9 fL (ref 78.0–100.0)
PLATELETS: 285 10*3/uL (ref 150–400)
RBC: 5.44 MIL/uL (ref 4.22–5.81)
RDW: 13.6 % (ref 11.5–15.5)
WBC: 11.5 10*3/uL — AB (ref 4.0–10.5)

## 2016-01-30 LAB — COMPREHENSIVE METABOLIC PANEL
ALT: 28 U/L (ref 17–63)
AST: 25 U/L (ref 15–41)
Albumin: 3.7 g/dL (ref 3.5–5.0)
Alkaline Phosphatase: 113 U/L (ref 38–126)
Anion gap: 12 (ref 5–15)
BILIRUBIN TOTAL: 1.4 mg/dL — AB (ref 0.3–1.2)
BUN: 33 mg/dL — AB (ref 6–20)
CALCIUM: 9 mg/dL (ref 8.9–10.3)
CO2: 27 mmol/L (ref 22–32)
CREATININE: 1.18 mg/dL (ref 0.61–1.24)
Chloride: 97 mmol/L — ABNORMAL LOW (ref 101–111)
GFR calc Af Amer: >60 mL/min (ref >60)
Glucose, Bld: 109 mg/dL — ABNORMAL HIGH (ref 65–99)
Potassium: 3.7 mmol/L (ref 3.5–5.1)
Sodium: 136 mmol/L (ref 135–145)
TOTAL PROTEIN: 7.7 g/dL (ref 6.5–8.1)

## 2016-01-30 LAB — I-STAT TROPONIN, ED: TROPONIN I, POC: 0 ng/mL (ref 0.00–0.08)

## 2016-01-30 LAB — MAGNESIUM: Magnesium: 1.9 mg/dL (ref 1.7–2.4)

## 2016-01-30 LAB — PHOSPHORUS: Phosphorus: 5.3 mg/dL — ABNORMAL HIGH (ref 2.5–4.6)

## 2016-01-30 LAB — I-STAT CG4 LACTIC ACID, ED
LACTIC ACID, VENOUS: 1.49 mmol/L (ref 0.5–2.0)
Lactic Acid, Venous: 1.3 mmol/L (ref 0.5–2.0)

## 2016-01-30 LAB — LIPASE, BLOOD: Lipase: 29 U/L (ref 11–51)

## 2016-01-30 MED ORDER — ONDANSETRON HCL 4 MG/2ML IJ SOLN
4.0000 mg | Freq: Once | INTRAMUSCULAR | Status: AC
  Administered 2016-01-30: 4 mg via INTRAVENOUS
  Filled 2016-01-30: qty 2

## 2016-01-30 MED ORDER — DILTIAZEM HCL 25 MG/5ML IV SOLN
10.0000 mg | Freq: Once | INTRAVENOUS | Status: AC
  Administered 2016-01-30: 10 mg via INTRAVENOUS
  Filled 2016-01-30: qty 5

## 2016-01-30 MED ORDER — SODIUM CHLORIDE 0.9 % IV SOLN: Freq: Once | INTRAVENOUS | Status: DC

## 2016-01-30 MED ORDER — POTASSIUM CHLORIDE IN NACL 20-0.9 MEQ/L-% IV SOLN
INTRAVENOUS | Status: DC
  Filled 2016-01-30 (×2): qty 1000

## 2016-01-30 MED ORDER — SODIUM CHLORIDE 0.9 % IV BOLUS (SEPSIS)
1000.0000 mL | Freq: Once | INTRAVENOUS | Status: AC
  Administered 2016-01-30: 1000 mL via INTRAVENOUS

## 2016-01-30 MED ORDER — MORPHINE SULFATE (PF) 4 MG/ML IV SOLN
4.0000 mg | Freq: Once | INTRAVENOUS | Status: AC
  Administered 2016-01-30: 4 mg via INTRAVENOUS
  Filled 2016-01-30: qty 1

## 2016-01-30 MED ORDER — MIDAZOLAM HCL 2 MG/2ML IJ SOLN: 1.0000 mg | Freq: Once | INTRAMUSCULAR | Status: DC

## 2016-01-30 MED ORDER — LABETALOL HCL 5 MG/ML IV SOLN
10.0000 mg | Freq: Once | INTRAVENOUS | Status: AC
  Administered 2016-01-30: 10 mg via INTRAVENOUS
  Filled 2016-01-30: qty 4

## 2016-01-30 MED ORDER — ONDANSETRON 4 MG PO TBDP: 4.0000 mg | ORAL_TABLET | Freq: Four times a day (QID) | ORAL | Status: DC | PRN

## 2016-01-30 MED ORDER — IOPAMIDOL (ISOVUE-300) INJECTION 61%
INTRAVENOUS | Status: AC
  Administered 2016-01-30: 100 mL
  Filled 2016-01-30: qty 100

## 2016-01-30 NOTE — Consult Note (Signed)
Reason for Consult:psbo Referring Physician: Dr Orlene Plum is an 65 y.o. male.  HPI: 65yo WM with COPD, HTN, PAD, h/o ischemic L colon s/p [place] with subsequent colostomy closure by Dr Brantley Stage comes in with c/o of ongoing LLQ pain since late Thursday. Pain and emesis on Thursday. Only vomited on Thursday. Last BM & flatus on Thursday. Something similar in 2016 but much more limited. Decreased PO over weekend- didn't feel like it. Didn't even feel like taking BP meds. No cp/tia. Chronic sob. Daughter and wife and hospitalist at beside  CT showed psbo and new liver mass  Past Medical History  Diagnosis Date  . COPD (chronic obstructive pulmonary disease) ([place])   . Hypertension   . High cholesterol   . Exposure to Agent Orange     [place] War  . Colitis   . GERD (gastroesophageal reflux disease)   . BPH (benign prostatic hypertrophy)   . Aorta aneurysm ([facility])     4.2 ascending thoracic aortic aneursym without evidence of abdominal aortic aneurysm on CTA 11/07/12  . Chronic back pain started in 1990's  . PAD (peripheral artery disease) ([employer])   . Aortic aneurysm ([place])     "unrepaired; has it checked q yr" (05/12/2013)  . Exertional shortness of breath   . OSA on CPAP   . Chronic lower back pain   . Cancer of lung [facility])     s/p resection in 2005 at [facility] / [place] patient    Past Surgical History  Procedure Laterality Date  . Lung lobectomy Left 2005    "upper lobe; bronchial tube replaced w/piece of lining of his rib" (05/12/2013)  . Laparotomy N/A 11/08/2012    Procedure: EXPLORATORY LAPAROTOMY with descending and sigmoid colectomy; wound vac placement and colostomy;  Surgeon: Joyice Faster. Cornett, MD;  Location: [place] OR;  Service: General;  Laterality: N/A;  . Femoral artery stent Right ?2002  . Colon surgery    . Colostomy  11/08/2012  . Colostomy reversal  05/12/2013  . Colostomy closure N/A 05/12/2013    Procedure: Exploratory Laparotomy, Lysis of adhesions, Colon resection,  Proctoscope, COLOSTOMY CLOSURE;  Surgeon: Joyice Faster. Cornett, MD;  Location: [facility] OR;  Service: General;  Laterality: N/A;  yellow fin stirrups     Family History  Problem Relation Age of Onset  . Heart attack Mother   . Tuberculosis Mother   . Heart attack Father   . Stroke Father   . Heart attack Brother   . Heart disease Sister   . Cancer Brother     Prostate    Social History:  reports that he quit smoking about 12 years ago. His smoking use included Cigarettes. He has a 30 pack-year smoking history. His smokeless tobacco use includes Chew. He reports that he does not drink alcohol or use illicit drugs.  Allergies:  Allergies  Allergen Reactions  . Amlodipine Besy-Benazepril Hcl     REACTION: sob  . Meloxicam     REACTION: stomach upset  . Penicillins     REACTION: Rash    Medications: I have reviewed the patient's current medications.  Results for orders placed or performed during the hospital encounter of 01/30/16 (from the past 48 hour(s))  I-Stat Troponin, ED (not at [facility])     Status: None   Collection Time: 01/30/16  3:21 PM  Result Value Ref Range   Troponin i, poc 0.00 0.00 - 0.08 ng/mL   Comment 3  Comment: Due to the release kinetics of cTnI, a negative result within the first hours of the onset of symptoms does not rule out myocardial infarction with certainty. If myocardial infarction is still suspected, repeat the test at appropriate intervals.   I-Stat CG4 Lactic Acid, ED     Status: None   Collection Time: 01/30/16  3:23 PM  Result Value Ref Range   Lactic Acid, Venous 1.49 0.5 - 2.0 mmol/L  Lipase, blood     Status: None   Collection Time: 01/30/16  3:33 PM  Result Value Ref Range   Lipase 29 11 - 51 U/L  Comprehensive metabolic panel     Status: Abnormal   Collection Time: 01/30/16  3:33 PM  Result Value Ref Range   Sodium 136 135 - 145 mmol/L   Potassium 3.7 3.5 - 5.1 mmol/L   Chloride 97 (L) 101 - 111 mmol/L   CO2 27 22 - 32 mmol/L     Glucose, Bld 109 (H) 65 - 99 mg/dL   BUN 33 (H) 6 - 20 mg/dL   Creatinine, Ser 1.18 0.61 - 1.24 mg/dL   Calcium 9.0 8.9 - 10.3 mg/dL   Total Protein 7.7 6.5 - 8.1 g/dL   Albumin 3.7 3.5 - 5.0 g/dL   AST 25 15 - 41 U/L   ALT 28 17 - 63 U/L   Alkaline Phosphatase 113 38 - 126 U/L   Total Bilirubin 1.4 (H) 0.3 - 1.2 mg/dL   GFR calc non Af Amer >60 >60 mL/min   GFR calc Af Amer >60 >60 mL/min    Comment: (NOTE) The eGFR has been calculated using the CKD EPI equation. This calculation has not been validated in all clinical situations. eGFR's persistently <60 mL/min signify possible Chronic Kidney Disease.    Anion gap 12 5 - 15  CBC     Status: Abnormal   Collection Time: 01/30/16  3:33 PM  Result Value Ref Range   WBC 11.5 (H) 4.0 - 10.5 K/uL   RBC 5.44 4.22 - 5.81 MIL/uL   Hemoglobin 15.7 13.0 - 17.0 g/dL   HCT 47.3 39.0 - 52.0 %   MCV 86.9 78.0 - 100.0 fL   MCH 28.9 26.0 - 34.0 pg   MCHC 33.2 30.0 - 36.0 g/dL   RDW 13.6 11.5 - 15.5 %   Platelets 285 150 - 400 K/uL  I-Stat CG4 Lactic Acid, ED     Status: None   Collection Time: 01/30/16  7:53 PM  Result Value Ref Range   Lactic Acid, Venous 1.30 0.5 - 2.0 mmol/L  Magnesium     Status: None   Collection Time: 01/30/16  8:35 PM  Result Value Ref Range   Magnesium 1.9 1.7 - 2.4 mg/dL  Phosphorus     Status: Abnormal   Collection Time: 01/30/16  8:35 PM  Result Value Ref Range   Phosphorus 5.3 (H) 2.5 - 4.6 mg/dL    Ct Abdomen Pelvis W Contrast  01/30/2016  CLINICAL DATA:  Left-sided flank pain and diarrhea. Evaluate for enteric obstruction. History of colon surgery, lung cancer, COPD and thoracic aneurysm. EXAM: CT ABDOMEN AND PELVIS WITH CONTRAST TECHNIQUE: Multidetector CT imaging of the abdomen and pelvis was performed using the standard protocol following bolus administration of intravenous contrast. CONTRAST:  174m ISOVUE-300 IOPAMIDOL (ISOVUE-300) INJECTION 61% COMPARISON:  CT the chest, abdomen pelvis - 11/07/2012  FINDINGS: Lower chest: Limited visualization of the lower thorax demonstrates subsegmental atelectasis within the imaged left lower lobe as  well as the right costophrenic angle, similar to the 11/2012 examination. Mild centrilobular emphysematous change. No focal airspace opacities. No pleural effusion. Borderline cardiomegaly.  No pericardial effusion. Hepatobiliary: Normal hepatic contour. Interval development of a peripherally enhancing subcapsular mass involving the caudal aspect of the right lobe of the liver which measures approximately 5.3 x 4.9 x 3.1 cm (as measured in greatest oblique axial - image 33, series 2 and coronal - image 76, series 5, dimensions respectively), new since the 11/2012 examination. This mass is noted to contain a peculiar frondlike extension (coronal image 70, series 5). No additional discrete hepatic lesions are seen. Two gallstones are seen within the neck of an otherwise normal-appearing gallbladder with dominant stone measuring 2 cm in diameter (image 27, series 2,, likely increased in size since the 11/2012 examination. No gallbladder wall thickening or pericholecystic fluid. No intra or extrahepatic bili duct dilatation. No ascites. Pancreas: Slightly atrophic but otherwise normal in appearance Spleen: Punctate granuloma as seen within otherwise normal-appearing spleen. Several tiny splenules are noted about the splenic hilum. Adrenals/Urinary Tract: There is symmetric enhancement and excretion of the bilateral kidneys. Geographic atrophy involving the posterior aspect of the interpolar and superior pole of the left kidney is unchanged. No definite renal stones on this postcontrast examination. Note is made of a approximately 1 cm hypo attenuating (9 Hounsfield unit) cyst arising from the inferior pole the left kidney. No discrete right-sided renal lesions. No urinary obstruction or perinephric stranding. Normal appearance of the bilateral adrenal glands. Normal appearance of  the urinary bladder given degree distention. Stomach/Bowel: There is moderate gas distention of the upstream small bowel with apparent transition point located within the lateral aspect of the left mid/lower abdomen (axial image 59, series 2, coronal image 67, series 5) with associated decompression of the downstream small bowel. Post left hemicolectomy without evidence of enteric obstruction at the level of the operative site. Normal appearance of the terminal ileum and diminutive retrocecal appendix. No pneumoperitoneum, pneumatosis or portal venous gas. Vascular/Lymphatic: Moderate to large amount of eccentric slightly irregular mixed calcified and noncalcified atherosclerotic plaque within the abdominal aorta. There is mild ectasia involving the infrarenal abdominal aorta measuring approximately 2.6 cm in diameter (image 43, series 2, similar to the 11/2012 examination. A stent is seen within the right common iliac artery. The stent appears widely patent on this non CTA examination. Three separate left-sided renal arteries are identified, 1 of which again appears occluded in correlates with the geographic atrophy involving the left kidney. Duplicated right-sided renal arteries all appear widely patent. No bulky retroperitoneal, mesenteric, pelvic or inguinal lymphadenopathy. Reproductive: Normal appearance of the pelvic organs. There is a trace amount of fluid seen with the lower pelvis, likely reactive. Other: There is focal subcutaneous thickening involving in the lateral aspect the left mid hemi abdomen (image 43, series 2), likely the site of a prior laparotomy port. Regional soft tissues appear otherwise normal. Musculoskeletal: No acute or aggressive osseous abnormalities. Hemangioma involving the anterior superior endplate of the S1 vertebral body is unchanged. IMPRESSION: 1. Findings worrisome for high-grade bowel obstruction with transition point located within the lateral aspect of the left mid/lower  abdomen. The etiology of this apparent transition point is not identified and thus presumably secondary to adhesions. 2. Interval development of a peripherally enhancing approximately 5.3 cm mass within the subcapsular aspect of the right lobe of the liver, which is indeterminate though given history of lung cancer, is worrisome for metastatic disease. 3. Cholelithiasis without evidence of  cholecystitis. 4. Post left hemicolectomy. Electronically Signed   By: Sandi Mariscal M.D.   On: 01/30/2016 17:45    Review of Systems  Constitutional: Negative for weight loss.  HENT: Negative for nosebleeds.   Eyes: Negative for blurred vision.  Respiratory: Positive for shortness of breath.   Cardiovascular: Negative for chest pain, palpitations, orthopnea and PND.       + DOE, chronic SOB  Gastrointestinal: Positive for nausea, vomiting, abdominal pain and constipation.  Genitourinary: Negative for dysuria and hematuria.  Musculoskeletal: Negative.   Skin: Negative for itching and rash.  Neurological: Negative for dizziness, focal weakness, seizures, loss of consciousness and headaches.       Denies TIAs, amaurosis fugax  Endo/Heme/Allergies: Does not bruise/bleed easily.  Psychiatric/Behavioral: The patient is not nervous/anxious.    Blood pressure 177/79, pulse 105, temperature 98.4 F (36.9 C), temperature source Oral, resp. rate 19, height _0  (1.905 m), weight 97.523 kg (215 lb), SpO2 98 %. Physical Exam  Vitals reviewed. Constitutional: He is oriented to person, place, and time. He appears well-developed and well-nourished. No distress.  HENT:  Head: Normocephalic and atraumatic.  Right Ear: External ear normal.  Left Ear: External ear normal.  Eyes: Conjunctivae are normal. No scleral icterus.  Neck: Normal range of motion. Neck supple. No tracheal deviation present. No thyromegaly present.  Cardiovascular: Normal rate and normal heart sounds.   Respiratory: Effort normal and breath sounds  normal. No stridor. No respiratory distress. He has no wheezes.  GI: Soft. He exhibits distension. There is no tenderness. There is no rebound and no guarding.    Fullness in abdomen, soft, mild TTP. No rebound, guarding, peritonitis  Musculoskeletal: He exhibits no edema or tenderness.  Lymphadenopathy:    He has no cervical adenopathy.  Neurological: He is alert and oriented to person, place, and time. He exhibits normal muscle tone.  Skin: Skin is warm and dry. No rash noted. He is not diaphoretic. No erythema. No pallor.  Psychiatric: He has a normal mood and affect. His behavior is normal. Judgment and thought content normal.    Assessment/Plan: psbo New right liver mass H/o lung cancer COPD OSA HTN H/o Afib PAD  [facility] physician essentially in room for my entire visit with pt. rec admission to hospital, bowel rest, NG tube. Explained in [place] partial bowel obstructions and their management. Currently pt has no indications for urgent laparotomy (no fever, tachycardia, worrisome CT signs, or physical exam). Discussed that sbo typically can get better with nonsurgical mgmt but occasionally if it fails to resolve or if it worsens that would warrant surgical exploration. Discussed at length our SBO protocol. Pt wanted to a 2nd opinion and wants to see [facility] team. Offered him 2nd opinion by Dr Georgette Dover in AM &/or admission to this hospital to at least start treatment and put in for a transfer to [facility]. After much discussion, pt declined all the above options and wants to leave. States he would probably go to [place] tomorrow or at least call his [place] doctor in the AM. Explained several times and in difft ways that he would be leaving against medical advice and that by doing so his condition could worsen. He voiced understanding and still expressed a wish to leave tonight as opposed to waiting on hospital transfer.   Once he had settled on his disposition, we discussed the finding of the new  liver lesion and that this  Would need follow up. We discussed that this  could be a benign or malignant lesion but given his h/o cancer that it was concerning for cancer to proven otherwise. The pt and family voiced understanding.  Advised the pt he could always come back to the ED.   Total time 45 minutes  Leighton Ruff. Redmond Pulling, MD, FACS General, Bariatric, & Minimally Invasive Surgery [facility], [place]  [employer] M 01/30/2016, 9:10 PM

## 2016-01-30 NOTE — Progress Notes (Signed)
Patient ID: James [name], male   DOB: 11/28/1950, 65 y.o.   MRN: [id number] The patient signed AMA and discharged from the emergency department before he was fully evaluated. Dr. Greer Pickerel to spent over 30 minutes, in my presence, advising the patient and his family members about the risks of leaving tonight without getting recommended medical treatment. The patient seems to be inclined to be seen at the [facility] in [place], [place]. Admission to the hospitalist service was cancelled.   Tennis Must, MD

## 2016-01-30 NOTE — ED Notes (Signed)
Pt departed with family and in NAD.

## 2016-01-30 NOTE — ED Provider Notes (Addendum)
CSN: [id number]     Arrival date & time 01/30/16  1405 History   First MD Initiated Contact with Patient 01/30/16 1421     Chief Complaint  Patient presents with  . Abdominal Pain     (Consider location/radiation/quality/duration/timing/severity/associated sxs/prior Treatment) HPI   Thurs developed abdominal pain and vomiting, Friday had many episodes of emesis, probably 15 times, then no vomiting since but continuing pain and he reports he is not eating or drinking really since Thursday.  Left side of abdomen, constant, improved since getting to hospital.  Sharp pain on left side, was constant until coming here. Last stool was maybe Wednesday. Not passing flatus since Wed or Thursday.  Pain currently is not present, no nausea at this time.    Past Medical History  Diagnosis Date  . COPD (chronic obstructive pulmonary disease) ([place])   . Hypertension   . High cholesterol   . Exposure to Agent Orange     [place] War  . Colitis   . GERD (gastroesophageal reflux disease)   . BPH (benign prostatic hypertrophy)   . Aorta aneurysm (HCC)     4.2 ascending thoracic aortic aneursym without evidence of abdominal aortic aneurysm on CTA 11/07/12  . Chronic back pain started in 1990's  . PAD (peripheral artery disease) ([place])   . Aortic aneurysm ([place])     "unrepaired; has it checked q yr" (05/12/2013)  . Exertional shortness of breath   . OSA on CPAP   . Chronic lower back pain   . Cancer of lung [facility])     s/p resection in 2005 at [facility] / [place] patient   Past Surgical History  Procedure Laterality Date  . Lung lobectomy Left 2005    "upper lobe; bronchial tube replaced w/piece of lining of his rib" (05/12/2013)  . Laparotomy N/A 11/08/2012    Procedure: EXPLORATORY LAPAROTOMY with descending and sigmoid colectomy; wound vac placement and colostomy;  Surgeon: Joyice Faster. Cornett, MD;  Location: [place] OR;  Service: General;  Laterality: N/A;  . Femoral artery stent Right ?2002  . Colon surgery    .  Colostomy  11/08/2012  . Colostomy reversal  05/12/2013  . Colostomy closure N/A 05/12/2013    Procedure: Exploratory Laparotomy, Lysis of adhesions, Colon resection, Proctoscope, COLOSTOMY CLOSURE;  Surgeon: Joyice Faster. Cornett, MD;  Location: [facility] OR;  Service: General;  Laterality: N/A;  yellow fin stirrups    Family History  Problem Relation Age of Onset  . Heart attack Mother   . Tuberculosis Mother   . Heart attack Father   . Stroke Father   . Heart attack Brother   . Heart disease Sister   . Cancer Brother     Prostate   Social History  Substance Use Topics  . Smoking status: Former Smoker -- 1.00 packs/day for 30 years    Types: Cigarettes    Quit date: 10/02/2003  . Smokeless tobacco: Current User    Types: Chew  . Alcohol Use: No    Review of Systems  Constitutional: Negative for fever.  HENT: Negative for sore throat.   Eyes: Negative for visual disturbance.  Respiratory: Negative for shortness of breath.   Cardiovascular: Negative for chest pain and palpitations.  Gastrointestinal: Positive for nausea, vomiting, abdominal pain and constipation.  Genitourinary: Negative for difficulty urinating. Hematuria: dark colored.  Musculoskeletal: Negative for back pain and neck stiffness.  Skin: Negative for rash.  Neurological: Negative for syncope and headaches.      Allergies  Amlodipine  besy-benazepril hcl; Meloxicam; and Penicillins  Home Medications   Prior to Admission medications   Medication Sig Start Date End Date Taking? Authorizing Provider  albuterol (PROVENTIL HFA;VENTOLIN HFA) 108 (90 BASE) MCG/ACT inhaler Inhale 2 puffs into the lungs every 6 (six) hours as needed for wheezing or shortness of breath. For shortness of breath   Yes Historical Provider, MD  atorvastatin (LIPITOR) 40 MG tablet Take 20 mg by mouth daily.   Yes Historical Provider, MD  diltiazem (DILACOR XR) 240 MG 24 hr capsule Take 240 mg by mouth daily.   Yes Historical Provider, MD   finasteride (PROSCAR) 5 MG tablet Take 5 mg by mouth daily.   Yes Historical Provider, MD  fluticasone (FLONASE) 50 MCG/ACT nasal spray Place 2 sprays into both nostrils daily.   Yes Historical Provider, MD  gabapentin (NEURONTIN) 300 MG capsule Take 300-600 mg by mouth 2 (two) times daily. 300 mg in morning and 600 mg in the evening   Yes Historical Provider, MD  guaiFENesin (MUCINEX) 600 MG 12 hr tablet Take 600 mg by mouth daily.   Yes Historical Provider, MD  HYDROcodone-acetaminophen (NORCO) 10-325 MG per tablet Take 1 tablet by mouth every 6 (six) hours as needed.   Yes Historical Provider, MD  lisinopril-hydrochlorothiazide (PRINZIDE,ZESTORETIC) 20-25 MG tablet Take 1 tablet by mouth daily.   Yes Historical Provider, MD  omeprazole (PRILOSEC OTC) 20 MG tablet Take 20 mg by mouth 2 (two) times daily as needed (Acid reflux).    Yes Historical Provider, MD  omeprazole (PRILOSEC) 20 MG capsule Take 20 mg by mouth daily.   Yes Historical Provider, MD  OVER THE COUNTER MEDICATION Take 1 tablet by mouth daily. Insync probiotic   Yes Historical Provider, MD  pravastatin (PRAVACHOL) 40 MG tablet Take 20 mg by mouth at bedtime.  10/31/12  Yes Historical Provider, MD  aspirin 81 MG tablet Take 81 mg by mouth daily.    Historical Provider, MD  budesonide-formoterol (SYMBICORT) 160-4.5 MCG/ACT inhaler Inhale 2 puffs into the lungs 2 (two) times daily.    Historical Provider, MD  diltiazem (TIAZAC) 240 MG 24 hr capsule Take 240 mg by mouth 2 (two) times daily.    Historical Provider, MD  fish oil-omega-3 fatty acids 1000 MG capsule Take 2 g by mouth 2 (two) times daily.    Historical Provider, MD  Ibuprofen-Diphenhydramine HCl (ADVIL PM) 200-25 MG CAPS Take 2 tablets by mouth at bedtime.     Historical Provider, MD  levofloxacin (LEVAQUIN) 750 MG tablet Take 1 tablet (750 mg total) by mouth daily. 05/19/13   Erroll Luna, MD  lisinopril-hydrochlorothiazide (PRINZIDE,ZESTORETIC) 20-12.5 MG per tablet Take  1 tablet by mouth daily. 02/19/13   Historical Provider, MD  oxyCODONE-acetaminophen (ROXICET) 5-325 MG per tablet Take 1 tablet by mouth every 4 (four) hours as needed for pain. 05/19/13   Erroll Luna, MD  polyethylene glycol powder [facility]) powder  05/19/13   Historical Provider, MD  tiotropium (SPIRIVA) 18 MCG inhalation capsule Place 18 mcg into inhaler and inhale daily.    Historical Provider, MD  traMADol (ULTRAM) 50 MG tablet Take 50 mg by mouth daily as needed for pain.  02/24/13   Historical Provider, MD   BP 174/127 mmHg  Pulse 85  Temp(Src) 98.4 F (36.9 C) (Oral)  Resp 14  Ht '6\' 3"'$  (1.905 m)  Wt 215 lb (97.523 kg)  BMI 26.87 kg/m2  SpO2 93% Physical Exam  Constitutional: He is oriented to person, place, and time. He appears  well-developed and well-nourished. No distress.  HENT:  Head: Normocephalic and atraumatic.  Eyes: Conjunctivae and EOM are normal.  Neck: Normal range of motion.  Cardiovascular: Normal heart sounds and intact distal pulses.  An irregularly irregular rhythm present. Tachycardia present.  Exam reveals no gallop and no friction rub.   No murmur heard. Pulmonary/Chest: Effort normal and breath sounds normal. No respiratory distress. He has no wheezes. He has no rales.  Abdominal: Soft. He exhibits distension. There is tenderness (mild left sided). There is no guarding.  Musculoskeletal: He exhibits no edema.  Neurological: He is alert and oriented to person, place, and time.  Skin: Skin is warm and dry. He is not diaphoretic.  Nursing note and vitals reviewed.   ED Course  Procedures (including critical care time) Labs Review Labs Reviewed  COMPREHENSIVE METABOLIC PANEL - Abnormal; Notable for the following:    Chloride 97 (*)    Glucose, Bld 109 (*)    BUN 33 (*)    Total Bilirubin 1.4 (*)    All other components within normal limits  CBC - Abnormal; Notable for the following:    WBC 11.5 (*)    All other components within normal  limits  LIPASE, BLOOD  URINALYSIS, ROUTINE W REFLEX MICROSCOPIC (NOT AT [facility])  I-STAT TROPOININ, ED  I-STAT CG4 LACTIC ACID, ED  I-STAT CG4 LACTIC ACID, ED    Imaging Review Ct Abdomen Pelvis W Contrast  01/30/2016  CLINICAL DATA:  Left-sided flank pain and diarrhea. Evaluate for enteric obstruction. History of colon surgery, lung cancer, COPD and thoracic aneurysm. EXAM: CT ABDOMEN AND PELVIS WITH CONTRAST TECHNIQUE: Multidetector CT imaging of the abdomen and pelvis was performed using the standard protocol following bolus administration of intravenous contrast. CONTRAST:  179m ISOVUE-300 IOPAMIDOL (ISOVUE-300) INJECTION 61% COMPARISON:  CT the chest, abdomen pelvis - 11/07/2012 FINDINGS: Lower chest: Limited visualization of the lower thorax demonstrates subsegmental atelectasis within the imaged left lower lobe as well as the right costophrenic angle, similar to the 11/2012 examination. Mild centrilobular emphysematous change. No focal airspace opacities. No pleural effusion. Borderline cardiomegaly.  No pericardial effusion. Hepatobiliary: Normal hepatic contour. Interval development of a peripherally enhancing subcapsular mass involving the caudal aspect of the right lobe of the liver which measures approximately 5.3 x 4.9 x 3.1 cm (as measured in greatest oblique axial - image 33, series 2 and coronal - image 76, series 5, dimensions respectively), new since the 11/2012 examination. This mass is noted to contain a peculiar frondlike extension (coronal image 70, series 5). No additional discrete hepatic lesions are seen. Two gallstones are seen within the neck of an otherwise normal-appearing gallbladder with dominant stone measuring 2 cm in diameter (image 27, series 2,, likely increased in size since the 11/2012 examination. No gallbladder wall thickening or pericholecystic fluid. No intra or extrahepatic bili duct dilatation. No ascites. Pancreas: Slightly atrophic but otherwise normal in  appearance Spleen: Punctate granuloma as seen within otherwise normal-appearing spleen. Several tiny splenules are noted about the splenic hilum. Adrenals/Urinary Tract: There is symmetric enhancement and excretion of the bilateral kidneys. Geographic atrophy involving the posterior aspect of the interpolar and superior pole of the left kidney is unchanged. No definite renal stones on this postcontrast examination. Note is made of a approximately 1 cm hypo attenuating (9 Hounsfield unit) cyst arising from the inferior pole the left kidney. No discrete right-sided renal lesions. No urinary obstruction or perinephric stranding. Normal appearance of the bilateral adrenal glands. Normal appearance of the urinary  bladder given degree distention. Stomach/Bowel: There is moderate gas distention of the upstream small bowel with apparent transition point located within the lateral aspect of the left mid/lower abdomen (axial image 59, series 2, coronal image 67, series 5) with associated decompression of the downstream small bowel. Post left hemicolectomy without evidence of enteric obstruction at the level of the operative site. Normal appearance of the terminal ileum and diminutive retrocecal appendix. No pneumoperitoneum, pneumatosis or portal venous gas. Vascular/Lymphatic: Moderate to large amount of eccentric slightly irregular mixed calcified and noncalcified atherosclerotic plaque within the abdominal aorta. There is mild ectasia involving the infrarenal abdominal aorta measuring approximately 2.6 cm in diameter (image 43, series 2, similar to the 11/2012 examination. A stent is seen within the right common iliac artery. The stent appears widely patent on this non CTA examination. Three separate left-sided renal arteries are identified, 1 of which again appears occluded in correlates with the geographic atrophy involving the left kidney. Duplicated right-sided renal arteries all appear widely patent. No bulky  retroperitoneal, mesenteric, pelvic or inguinal lymphadenopathy. Reproductive: Normal appearance of the pelvic organs. There is a trace amount of fluid seen with the lower pelvis, likely reactive. Other: There is focal subcutaneous thickening involving in the lateral aspect the left mid hemi abdomen (image 43, series 2), likely the site of a prior laparotomy port. Regional soft tissues appear otherwise normal. Musculoskeletal: No acute or aggressive osseous abnormalities. Hemangioma involving the anterior superior endplate of the S1 vertebral body is unchanged. IMPRESSION: 1. Findings worrisome for high-grade bowel obstruction with transition point located within the lateral aspect of the left mid/lower abdomen. The etiology of this apparent transition point is not identified and thus presumably secondary to adhesions. 2. Interval development of a peripherally enhancing approximately 5.3 cm mass within the subcapsular aspect of the right lobe of the liver, which is indeterminate though given history of lung cancer, is worrisome for metastatic disease. 3. Cholelithiasis without evidence of cholecystitis. 4. Post left hemicolectomy. Electronically Signed   By: Sandi Mariscal M.D.   On: 01/30/2016 17:45   I have personally reviewed and evaluated these images and lab results as part of my medical decision-making.   EKG Interpretation   Date/Time:  Monday Jan 30 2016 14:11:25 EDT Ventricular Rate:  127 PR Interval:    QRS Duration: 107 QT Interval:  344 QTC Calculation: 500 R Axis:   74 Text Interpretation:  Atrial fibrillation Nonspecific repol abnormality,  lateral leads No significant change since last tracing Confirmed by  [facility] MD, Gerrell Tabet ([id number]) on 01/30/2016 5:19:41 PM      MDM   Final diagnoses:  Intestinal adhesions with obstruction (HCC)  Atrial fibrillation, unspecified type [facility])  Liver mass   65 year old male with a history of COPD, hypertension, hypercholesterolemia, aortic  aneurysm, lung cancer status post resection, exploratory laparotomy with descending and sigmoid colectomy, colostomy and colostomy reversal presents with concern for left-sided abdominal pain, nausea and vomiting. Lipase within normal limits, CMP within normal limits. Patient in atrial fibrillation with RVR and rate in the 120s initially on arrival to the emergency department. This is likely multifactorial secondary to patient not being able to take his diltiazem today, and dehydration. Patient was given a bolus of normal saline, and 10 of diltiazem with decrease in heart rate to the 90s.  CT abdomen and pelvis obtained showing high-grade bowel obstruction secondary to adhesions, interval development of 5.3 cm hepatic mass.    Discussed with general surgery, Dr. Redmond Pulling, who evaluated the patient.  Recommends NG tube placement and medicine admission given other medical comorbidities.  Medicine paged to admit pt.  Patient's heart rate decreased after receiving 10 mg of diltiazem and 1 L bolus of normal saline to the 100s. He is now hypertensive 170s over 110s, with atrial fibrillation and rate in the 100s to 110s, and will give 10 mg of IV labetalol.    Gareth Morgan, MD 01/30/16 1900

## 2016-01-30 NOTE — ED Notes (Signed)
Pt states he has had LLQ pain since Thursday with nausea and vomiting. Pt states no vomiting since Thursday. Pt reports last BM Wednesday. Pt denies any pain at this time. pts HR 130 afib on arrival. Pt states he hasn't had any PO meds since thursday. Pt reports daily Caredizem PO.

## 2016-01-30 NOTE — ED Notes (Signed)
Patient transported to CT 

## 2016-01-30 NOTE — ED Provider Notes (Signed)
Seen by hospitalist, Dr Olevia Bowens and general surgeon, Dr Redmond Pulling. Explained necessity of admission. Discussed possible poor outcomes if patient leaves AMA including death. Patient is competent to make medical decisions regarding his care. States he wants to be discharged to follow-up with the [place]. Return precautions given.  Julianne Rice, MD 01/30/16 2114

## 2016-01-30 NOTE — ED Notes (Signed)
Surgeon at bedside.  

## 2016-01-30 NOTE — Discharge Instructions (Signed)
Atrial Fibrillation Atrial fibrillation is a type of heartbeat that is irregular or fast (rapid). If you have this condition, your heart keeps quivering in a weird (chaotic) way. This condition can make it so your heart cannot pump blood normally. Having this condition gives a person more risk for stroke, heart failure, and other heart problems. There are different types of atrial fibrillation. Talk with your doctor to learn about the type that you have. HOME CARE  Take over-the-counter and prescription medicines only as told by your doctor.  If your doctor prescribed a blood-thinning medicine, take it exactly as told. Taking too much of it can cause bleeding. If you do not take enough of it, you will not have the protection that you need against stroke and other problems.  Do not use any tobacco products. These include cigarettes, chewing tobacco, and e-cigarettes. If you need help quitting, ask your doctor.  If you have apnea (obstructive sleep apnea), manage it as told by your doctor.  Do not drink alcohol.  Do not drink beverages that have caffeine. These include coffee, soda, and tea.  Maintain a healthy weight. Do not use diet pills unless your doctor says they are safe for you. Diet pills may make heart problems worse.  Follow diet instructions as told by your doctor.  Exercise regularly as told by your doctor.  Keep all follow-up visits as told by your doctor. This is important. GET HELP IF:  You notice a change in the speed, rhythm, or strength of your heartbeat.  You are taking a blood-thinning medicine and you notice more bruising.  You get tired more easily when you move or exercise. GET HELP RIGHT AWAY IF:  You have pain in your chest or your belly (abdomen).  You have sweating or weakness.  You feel sick to your stomach (nauseous).  You notice blood in your throw up (vomit), poop (stool), or pee (urine).  You are short of breath.  You suddenly have swollen feet  and ankles.  You feel dizzy.  Your suddenly get weak or numb in your face, arms, or legs, especially if it happens on one side of your body.  You have trouble talking, trouble understanding, or both.  Your face or your eyelid droops on one side. These symptoms may be an emergency. Do not wait to see if the symptoms will go away. Get medical help right away. Call your local emergency services (911 in the [place] Do not drive yourself to the hospital.   This information is not intended to replace advice given to you by your health care provider. Make sure you discuss any questions you have with your health care provider.   Document Released: 06/26/2008 Document Revised: 06/08/2015 Document Reviewed: 01/12/2015 Elsevier Interactive Patient Education 2016 [place] Bowel Obstruction A small bowel obstruction is a blockage in the small bowel. The small bowel, which is also called the small intestine, is a long, slender tube that connects the stomach to the colon. When a person eats and drinks, food and fluids go from the stomach to the small bowel. This is where most of the nutrients in the food and fluids are absorbed. A small bowel obstruction will prevent food and fluids from passing through the small bowel as they normally do during digestion. The small bowel can become partially or completely blocked. This can cause symptoms such as abdominal pain, vomiting, and bloating. If this condition is not treated, it can be dangerous because the small bowel  could rupture. CAUSES Common causes of this condition include:  Scar tissue from previous surgery or radiation treatment.  Recent surgery. This may cause the movements of the bowel to slow down and cause food to block the intestine.  Hernias.  Inflammatory bowel disease (colitis).  Twisting of the bowel (volvulus).  Tumors.  A foreign body.  Slipping of a part of the bowel into another part  (intussusception). SYMPTOMS Symptoms of this condition include:  Abdominal pain. This may be dull cramps or sharp pain. It may occur in one area, or it may be present in the entire abdomen. Pain can range from mild to severe, depending on the degree of obstruction.  Nausea and vomiting. Vomit may be greenish or a yellow bile color.  Abdominal bloating.  Constipation.  Lack of passing gas.  Frequent belching.  Diarrhea. This may occur if the obstruction is partial and runny stool is able to leak around the obstruction. DIAGNOSIS This condition may be diagnosed based on a physical exam, medical history, and X-rays of the abdomen. You may also have other tests, such as a CT scan of the abdomen and pelvis. TREATMENT Treatment for this condition depends on the cause and severity of the problem. Treatment options may include:  Bed rest along with fluids and pain medicines that are given through an IV tube inserted into one of your veins. Sometimes, this is all that is needed for the obstruction to improve.  Following a simple diet. In some cases, a clear liquid diet may be required for several days. This allows the bowel to rest.  Placement of a small tube (nasogastric tube) into the stomach. When the bowel is blocked, it usually swells up like a balloon that is filled with air and fluids. The air and fluids may be removed by suction through the nasogastric tube. This can help with pain, discomfort, and nausea. It can also help the obstruction to clear up faster.  Surgery. This may be required if other treatments do not work. Bowel obstruction from a hernia may require early surgery and can be an emergency procedure. Surgery may also be required for scar tissue that causes frequent or severe obstructions. HOME CARE INSTRUCTIONS  Get plenty of rest.  Follow instructions from your health care provider about eating restrictions. You may need to avoid solid foods and consume only clear liquids  until your condition improves.  Take over-the-counter and prescription medicines only as told by your health care provider.  Keep all follow-up visits as told by your health care provider. This is important. SEEK MEDICAL CARE IF:  You have a fever.  You have chills. SEEK IMMEDIATE MEDICAL CARE IF:  You have increased pain or cramping.  You vomit blood.  You have uncontrolled vomiting or nausea.  You cannot drink fluids because of vomiting or pain.  You develop confusion.  You begin feeling very dry or thirsty (dehydrated).  You have severe bloating.  You feel extremely weak or you faint.   This information is not intended to replace advice given to you by your health care provider. Make sure you discuss any questions you have with your health care provider.   Document Released: 12/04/2005 Document Revised: 06/08/2015 Document Reviewed: 11/11/2014 Elsevier Interactive Patient Education [employer].

## 2018-07-17 ENCOUNTER — Emergency Department (HOSPITAL_COMMUNITY): Payer: Medicare Other

## 2018-07-17 ENCOUNTER — Encounter (HOSPITAL_COMMUNITY): Payer: Self-pay

## 2018-07-17 ENCOUNTER — Other Ambulatory Visit: Payer: Self-pay

## 2018-07-17 ENCOUNTER — Emergency Department (HOSPITAL_COMMUNITY)
Admission: EM | Admit: 2018-07-17 | Discharge: 2018-07-17 | Discharge status: Against Medical Advice | Payer: Medicare Other | Attending: Emergency Medicine | Admitting: Emergency Medicine

## 2018-07-17 DIAGNOSIS — R0902 Hypoxemia: Secondary | ICD-10-CM | POA: Diagnosis not present

## 2018-07-17 DIAGNOSIS — Z85118 Personal history of other malignant neoplasm of bronchus and lung: Secondary | ICD-10-CM | POA: Insufficient documentation

## 2018-07-17 DIAGNOSIS — Z79899 Other long term (current) drug therapy: Secondary | ICD-10-CM | POA: Diagnosis not present

## 2018-07-17 DIAGNOSIS — I10 Essential (primary) hypertension: Secondary | ICD-10-CM | POA: Diagnosis not present

## 2018-07-17 DIAGNOSIS — R0602 Shortness of breath: Secondary | ICD-10-CM | POA: Diagnosis present

## 2018-07-17 DIAGNOSIS — J441 Chronic obstructive pulmonary disease with (acute) exacerbation: Secondary | ICD-10-CM | POA: Diagnosis not present

## 2018-07-17 DIAGNOSIS — Z87891 Personal history of nicotine dependence: Secondary | ICD-10-CM | POA: Insufficient documentation

## 2018-07-17 DIAGNOSIS — Z7982 Long term (current) use of aspirin: Secondary | ICD-10-CM | POA: Insufficient documentation

## 2018-07-17 LAB — I-STAT VENOUS BLOOD GAS, ED
Acid-Base Excess: 3 mmol/L — ABNORMAL HIGH (ref 0.0–2.0)
BICARBONATE: 30.9 mmol/L — AB (ref 20.0–28.0)
O2 SAT: 21 %
PCO2 VEN: 60 mmHg (ref 44.0–60.0)
PO2 VEN: 17 mmHg — AB (ref 32.0–45.0)
TCO2: 33 mmol/L — AB (ref 22–32)
pH, Ven: 7.319 (ref 7.250–7.430)

## 2018-07-17 LAB — BASIC METABOLIC PANEL
Anion gap: 14 (ref 5–15)
BUN: 15 mg/dL (ref 8–23)
CALCIUM: 9.5 mg/dL (ref 8.9–10.3)
CO2: 26 mmol/L (ref 22–32)
CREATININE: 1.23 mg/dL (ref 0.61–1.24)
Chloride: 95 mmol/L — ABNORMAL LOW (ref 98–111)
GFR, EST NON AFRICAN AMERICAN: 58 mL/min — AB (ref >60)
Glucose, Bld: 113 mg/dL — ABNORMAL HIGH (ref 70–99)
Potassium: 3.9 mmol/L (ref 3.5–5.1)
Sodium: 135 mmol/L (ref 135–145)

## 2018-07-17 LAB — CBC WITH DIFFERENTIAL/PLATELET
Abs Immature Granulocytes: 0.05 10*3/uL (ref 0.00–0.07)
BASOS ABS: 0.1 10*3/uL (ref 0.0–0.1)
BASOS PCT: 1 %
EOS ABS: 0.4 10*3/uL (ref 0.0–0.5)
EOS PCT: 3 %
HEMATOCRIT: 44.3 % (ref 39.0–52.0)
Hemoglobin: 14 g/dL (ref 13.0–17.0)
Immature Granulocytes: 0 %
LYMPHS ABS: 2.3 10*3/uL (ref 0.7–4.0)
Lymphocytes Relative: 17 %
MCH: 29.2 pg (ref 26.0–34.0)
MCHC: 31.6 g/dL (ref 30.0–36.0)
MCV: 92.5 fL (ref 80.0–100.0)
MONOS PCT: 5 %
Monocytes Absolute: 0.7 10*3/uL (ref 0.1–1.0)
NRBC: 0 % (ref 0.0–0.2)
Neutro Abs: 9.8 10*3/uL — ABNORMAL HIGH (ref 1.7–7.7)
Neutrophils Relative %: 74 %
Platelets: 357 10*3/uL (ref 150–400)
RBC: 4.79 MIL/uL (ref 4.22–5.81)
RDW: 13.7 % (ref 11.5–15.5)
WBC: 13.2 10*3/uL — ABNORMAL HIGH (ref 4.0–10.5)

## 2018-07-17 LAB — I-STAT TROPONIN, ED: Troponin i, poc: 0 ng/mL (ref 0.00–0.08)

## 2018-07-17 MED ORDER — DOXYCYCLINE HYCLATE 100 MG PO TABS
100.0000 mg | ORAL_TABLET | Freq: Once | ORAL | Status: AC
  Administered 2018-07-17: 100 mg via ORAL
  Filled 2018-07-17: qty 1

## 2018-07-17 MED ORDER — PREDNISONE 20 MG PO TABS: 60.0000 mg | ORAL_TABLET | Freq: Every day | ORAL | 0 refills | Status: DC

## 2018-07-17 MED ORDER — DOXYCYCLINE HYCLATE 100 MG PO CAPS: 100.0000 mg | ORAL_CAPSULE | Freq: Two times a day (BID) | ORAL | 0 refills | Status: DC

## 2018-07-17 MED ORDER — OXYCODONE-ACETAMINOPHEN 5-325 MG PO TABS
1.0000 | ORAL_TABLET | Freq: Once | ORAL | Status: AC
  Administered 2018-07-17: 1 via ORAL
  Filled 2018-07-17: qty 1

## 2018-07-17 MED ORDER — ALBUTEROL (5 MG/ML) CONTINUOUS INHALATION SOLN
5.0000 mg/h | INHALATION_SOLUTION | Freq: Once | RESPIRATORY_TRACT | Status: AC
  Administered 2018-07-17: 5 mg/h via RESPIRATORY_TRACT
  Filled 2018-07-17: qty 20

## 2018-07-17 MED ORDER — DABIGATRAN ETEXILATE MESYLATE 150 MG PO CAPS
150.0000 mg | ORAL_CAPSULE | Freq: Once | ORAL | Status: AC
  Administered 2018-07-17: 150 mg via ORAL
  Filled 2018-07-17: qty 1

## 2018-07-17 MED ORDER — MAGNESIUM SULFATE 2 GM/50ML IV SOLN
2.0000 g | Freq: Once | INTRAVENOUS | Status: AC
  Administered 2018-07-17: 2 g via INTRAVENOUS
  Filled 2018-07-17: qty 50

## 2018-07-17 NOTE — Discharge Instructions (Signed)
You were evaluated in the emergency department for worsening shortness of breath.  You had blood work EKG and chest x-ray.  This is likely an exacerbation of your COPD and we are treating you with steroids and antibiotics.  You should continue your breathing treatments at home every 4 hours or as instructed by your doctor.  You are offered admission to the hospital for further treatment of this but you refused to be admitted.  Please return if any worsening symptoms.  You will need to continue to use oxygen at home.

## 2018-07-17 NOTE — ED Provider Notes (Signed)
[facility] EMERGENCY DEPARTMENT Provider Note   CSN: [id number] Arrival date & time: 07/17/18  1037     History   Chief Complaint Chief Complaint  Patient presents with  . Shortness of Breath    HPI James [name] is a 67 y.o. male.  He has a history of COPD and has been more short of breath and a productive cough with clear and yellow sputum over the last for 5 days.  He called 911 and when fire department got there his sats were in the low 70s.  EMS was given him albuterol Atrovent and Solu-Medrol with some improvement.  Patient himself denies any fever and no chest pain or abdominal pain.  No leg swelling.  Denies current smoking.  The history is provided by the patient and the EMS personnel.  Shortness of Breath  This is a recurrent problem. The average episode lasts 4 days. The problem occurs frequently.The problem has been gradually improving. Associated symptoms include cough, sputum production and wheezing. Pertinent negatives include no fever, no headaches, no rhinorrhea, no sore throat, no neck pain, no hemoptysis, no chest pain, no vomiting, no abdominal pain, no rash, no leg pain and no leg swelling. It is unknown what precipitated the problem. He has tried beta-agonist inhalers and ipratropium inhalers for the symptoms. The treatment provided mild relief. Associated medical issues include COPD.    Past Medical History:  Diagnosis Date  . Aorta aneurysm (HCC)    4.2 ascending thoracic aortic aneursym without evidence of abdominal aortic aneurysm on CTA 11/07/12  . Aortic aneurysm ([place])    "unrepaired; has it checked q yr" (05/12/2013)  . BPH (benign prostatic hypertrophy)   . Cancer of lung [facility])    s/p resection in 2005 at [facility] / [place] patient  . Chronic back pain started in 1990's  . Chronic lower back pain   . Colitis   . COPD (chronic obstructive pulmonary disease) ([place])   . Exertional shortness of breath   . Exposure to Agent Orange    [place]  War  . GERD (gastroesophageal reflux disease)   . High cholesterol   . Hypertension   . OSA on CPAP   . PAD (peripheral artery disease) [facility])     Patient Active Problem List   Diagnosis Date Noted  . SBO (small bowel obstruction) ([place]) 01/30/2016  . Pre-operative cardiovascular examination, supraventricular arrhythmia 04/04/2013  . Shortness of breath   . Post-operative state 12/02/2012  . Ischemic colitis ([place]) 11/13/2012  . Emesis 11/07/2012  . Diarrhea 11/07/2012  . Swollen upper lip 11/07/2012  . Paroxysmal a-fib, spont conversion to NSR 11/07/2012  . COPD (chronic obstructive pulmonary disease) ([place]) 11/07/2012  . Hyperglycemia, stress related 11/07/2012  . Elevated alkaline phosphatase level 11/07/2012  . SIRS criteria on admission (leukocytosis, hypotension, hypothermia) 11/07/2012  . Enteritis 11/07/2012  . HYPERLIPIDEMIA 11/01/2009  . HYPERTENSION 11/01/2009  . PERIPHERAL VASCULAR DISEASE 11/01/2009  . GERD 11/01/2009  . BENIGN PROSTATIC HYPERTROPHY 11/01/2009  . HERNIATED LUMBAR DISC 11/01/2009  . LOW BACK PAIN 11/01/2009  . LUNG CANCER, HX OF 11/01/2009  . DIVERTICULITIS, HX OF 11/01/2009    Past Surgical History:  Procedure Laterality Date  . COLON SURGERY    . COLOSTOMY  11/08/2012  . COLOSTOMY CLOSURE N/A 05/12/2013   Procedure: Exploratory Laparotomy, Lysis of adhesions, Colon resection, Proctoscope, COLOSTOMY CLOSURE;  Surgeon: Joyice Faster. Cornett, MD;  Location: [place];  Service: General;  Laterality: N/A;  yellow fin stirrups   .  COLOSTOMY REVERSAL  05/12/2013  . FEMORAL ARTERY STENT Right ?2002  . LAPAROTOMY N/A 11/08/2012   Procedure: EXPLORATORY LAPAROTOMY with descending and sigmoid colectomy; wound vac placement and colostomy;  Surgeon: Joyice Faster. Cornett, MD;  Location: [place];  Service: General;  Laterality: N/A;  . LUNG LOBECTOMY Left 2005   "upper lobe; bronchial tube replaced w/piece of lining of his rib" (05/12/2013)        Home Medications     Prior to Admission medications   Medication Sig Start Date End Date Taking? Authorizing Provider  albuterol (PROVENTIL HFA;VENTOLIN HFA) 108 (90 BASE) MCG/ACT inhaler Inhale 2 puffs into the lungs every 6 (six) hours as needed for wheezing or shortness of breath. For shortness of breath    [provider]  aspirin 81 MG tablet Take 81 mg by mouth daily.    [provider]  atorvastatin (LIPITOR) 40 MG tablet Take 20 mg by mouth daily.    [provider]  budesonide-formoterol (SYMBICORT) 160-4.5 MCG/ACT inhaler Inhale 2 puffs into the lungs 2 (two) times daily.    [provider]  diltiazem (DILACOR XR) 240 MG 24 hr capsule Take 240 mg by mouth daily.    [provider]  diltiazem (TIAZAC) 240 MG 24 hr capsule Take 240 mg by mouth 2 (two) times daily.    [provider]  finasteride (PROSCAR) 5 MG tablet Take 5 mg by mouth daily.    [provider]  fish oil-omega-3 fatty acids 1000 MG capsule Take 2 g by mouth 2 (two) times daily.    [provider]  fluticasone (FLONASE) 50 MCG/ACT nasal spray Place 2 sprays into both nostrils daily.    [provider]  gabapentin (NEURONTIN) 300 MG capsule Take 300-600 mg by mouth 2 (two) times daily. 300 mg in morning and 600 mg in the evening    [provider]  guaiFENesin (MUCINEX) 600 MG 12 hr tablet Take 600 mg by mouth daily.    [provider]  HYDROcodone-acetaminophen (NORCO) 10-325 MG per tablet Take 1 tablet by mouth every 6 (six) hours as needed.    [provider]  Ibuprofen-Diphenhydramine HCl (ADVIL PM) 200-25 MG CAPS Take 2 tablets by mouth at bedtime.     [provider]  levofloxacin (LEVAQUIN) 750 MG tablet Take 1 tablet (750 mg total) by mouth daily. 05/19/13   Cornett, Marcello Moores, MD  lisinopril-hydrochlorothiazide (PRINZIDE,ZESTORETIC) 20-12.5 MG per tablet Take 1 tablet by mouth daily. 02/19/13   [provider]   lisinopril-hydrochlorothiazide (PRINZIDE,ZESTORETIC) 20-25 MG tablet Take 1 tablet by mouth daily.    [provider]  omeprazole (PRILOSEC OTC) 20 MG tablet Take 20 mg by mouth 2 (two) times daily as needed (Acid reflux).     [provider]  omeprazole (PRILOSEC) 20 MG capsule Take 20 mg by mouth daily.    [provider]  ondansetron (ZOFRAN ODT) 4 MG disintegrating tablet Take 1 tablet (4 mg total) by mouth every 6 (six) hours as needed for nausea or vomiting. 01/30/16   Julianne Rice, MD  OVER THE COUNTER MEDICATION Take 1 tablet by mouth daily. Insync probiotic    [provider]  oxyCODONE-acetaminophen (ROXICET) 5-325 MG per tablet Take 1 tablet by mouth every 4 (four) hours as needed for pain. 05/19/13   Cornett, Marcello Moores, MD  polyethylene glycol powder (GLYCOLAX/MIRALAX) powder  05/19/13   [provider]  pravastatin (PRAVACHOL) 40 MG tablet Take 20 mg by mouth at bedtime.  10/31/12   [provider]  tiotropium (SPIRIVA) 18 MCG inhalation capsule Place 18 mcg into inhaler and inhale daily.    [provider]  traMADol (ULTRAM) 50 MG tablet Take 50 mg by mouth daily as needed for pain.  02/24/13   [provider]    Family History Family History  Problem Relation Age of Onset  . Heart attack Mother   . Tuberculosis Mother   . Heart attack Father   . Stroke Father   . Heart attack Brother   . Heart disease Sister   . Cancer Brother        Prostate    Social History Social History   Tobacco Use  . Smoking status: Former Smoker    Packs/day: 1.00    Years: 30.00    Pack years: 30.00    Types: Cigarettes    Last attempt to quit: 10/02/2003    Years since quitting: 14.8  . Smokeless tobacco: Current User    Types: Chew  Substance Use Topics  . Alcohol use: No  . Drug use: No     Allergies   Amlodipine besy-benazepril hcl; Meloxicam; and Penicillins   Review of Systems Review of Systems   Constitutional: Negative for fever.  HENT: Negative for rhinorrhea and sore throat.   Eyes: Negative for visual disturbance.  Respiratory: Positive for cough, sputum production, shortness of breath and wheezing. Negative for hemoptysis.   Cardiovascular: Negative for chest pain and leg swelling.  Gastrointestinal: Negative for abdominal pain and vomiting.  Genitourinary: Negative for dysuria.  Musculoskeletal: Negative for neck pain.  Skin: Negative for rash.  Neurological: Negative for headaches.     Physical Exam Updated Vital Signs BP (!) 117/57 (BP Location: Left Arm)   Pulse 88   Temp 98.2 F (36.8 C) (Oral)   Resp 14   SpO2 97%   Physical Exam  Constitutional: He appears well-developed and well-nourished.  HENT:  Head: Normocephalic and atraumatic.  Mouth/Throat: Oropharynx is clear and moist.  Eyes: Conjunctivae are normal.  Neck: Neck supple.  Cardiovascular: Normal rate and regular rhythm.  No murmur heard. Pulmonary/Chest: Accessory muscle usage present. Tachypnea noted. No respiratory distress. He has wheezes.  Abdominal: Soft. There is no tenderness.  Musculoskeletal: Normal range of motion. He exhibits no edema.       Right lower leg: Normal. He exhibits no tenderness and no edema.       Left lower leg: Normal. He exhibits no tenderness and no edema.  Neurological: He is alert.  Skin: Skin is warm and dry. Capillary refill takes less than 2 seconds.  Psychiatric: He has a normal mood and affect.  Nursing note and vitals reviewed.    ED Treatments / Results  Labs (all labs ordered are listed, but only abnormal results are displayed) Labs Reviewed  BASIC METABOLIC PANEL - Abnormal; Notable for the following components:      Result Value   Chloride 95 (*)    Glucose, Bld 113 (*)    GFR calc non Af Amer 58 (*)    All other components within normal limits  CBC WITH DIFFERENTIAL/PLATELET - Abnormal; Notable for the following components:   WBC 13.2 (*)     Neutro Abs 9.8 (*)    All other components within normal limits  I-STAT VENOUS BLOOD GAS, ED - Abnormal; Notable for the following components:   pO2, Ven 17.0 (*)    Bicarbonate 30.9 (*)    TCO2 33 (*)  Acid-Base Excess 3.0 (*)    All other components within normal limits  I-STAT TROPONIN, ED    EKG EKG Interpretation  Date/Time:  Thursday July 17 2018 10:43:20 EDT Ventricular Rate:  82 PR Interval:    QRS Duration: 95 QT Interval:  362 QTC Calculation: 423 R Axis:   86 Text Interpretation:  Atrial fibrillation Anterior infarct, old Minimal ST depression, inferior leads similar pattern to prior 5/17 Confirmed by Aletta Edouard [id number]) on 07/17/2018 10:47:26 AM Also confirmed by Aletta Edouard [phone]), editor Hattie Perch [phone])  on 07/17/2018 2:24:01 PM   Radiology Dg Chest Port 1 View  Result Date: 07/17/2018 CLINICAL DATA:  Shortness of breath. EXAM: PORTABLE CHEST 1 VIEW COMPARISON:  05/14/2013. FINDINGS: Surgical sutures noted over the right upper chest. Mediastinum and hilar structures are stable. Heart size normal. Lungs are clear of acute infiltrates. COPD and pleuroparenchymal thickening noted consistent with scarring. No pleural effusion or pneumothorax. Degenerative change thoracic spine. IMPRESSION: Surgical sutures noted over the right upper chest. COPD and pleuroparenchymal scarring. No acute abnormality identified. Electronically Signed   By: Marcello Moores  Register   On: 07/17/2018 11:02    Procedures Procedures (including critical care time)  Medications Ordered in ED Medications  magnesium sulfate IVPB 2 g 50 mL (has no administration in time range)  albuterol (PROVENTIL,VENTOLIN) solution continuous neb (has no administration in time range)     Initial Impression / Assessment and Plan / ED Course  I have reviewed the triage vital signs and the nursing notes.  Pertinent labs & imaging results that were available during my care of the patient  were reviewed by me and considered in my medical decision making (see chart for details).  Clinical Course as of Jul 19 1211  Thu Jul 17, 2018  1116 Patient's daughter here now and updated her on the plan.  He still undergoing neb but he says he is feeling improved and was hoping to be able to go home soon.   [MB]  1218 Reevaluated patient.  He is wheezing is improved.  He still satting between 88.  -92 on 3 L of oxygen.  He says he has oxygen at home that he does not use.  He is hoping to be able to be discharged.   [MB]  1331 Trending pulse ox with patient on 3 L and he did desat into the high 80s and increased his respiratory rate and heart rate.  I reviewed this with him and his family and he still adamant that he is going to go home.  He says he has oxygen at home has breathing treatments at home and can do all this same thing so we can refer him in the hospital.  He understands that he may acutely worsen and even runs the risk of being intubated.  He still wishes to be discharged.   [MB]  1428 Discussed with social work and they were able to arrange for the patient to get some oxygen for travel.  Again I reviewed with him my feelings about him being better cared for by staying in the hospital but he would like to be discharged.   [MB]    Clinical Course User Index [MB] Hayden Rasmussen, MD      Final Clinical Impressions(s) / ED Diagnoses   Final diagnoses:  COPD exacerbation ([place])  Hypoxia    ED Discharge Orders         Ordered    doxycycline (VIBRAMYCIN) 100 MG capsule  2 times  daily     07/17/18 1419    predniSONE (DELTASONE) 20 MG tablet  Daily,   Status:  Discontinued     07/17/18 1419    predniSONE (DELTASONE) 20 MG tablet  Daily     07/17/18 1421           Hayden Rasmussen, MD 07/18/18 1212

## 2018-07-17 NOTE — ED Triage Notes (Signed)
Pt brought in by EMS due to having SOB. Pt has hx of COPD. Per pt, he has had a cough with a productive cough. Pt denies fever. Pt recevied 10mg  of albuterol, 1mg  of atrovent, and 125mg  of solumedrol.

## 2018-07-17 NOTE — Progress Notes (Signed)
CSW aware that pt is in need of transportable oxygen. CSW spoke with RNCM who is working on this matter at this time.    Virgie Dad Yossef Gilkison, MSW, [facility] Clinical Social Worker [phone]

## 2018-07-17 NOTE — ED Notes (Signed)
Prior to ambulation Sp02 reading 89-90% on 2L/[place]. Adjusted O2 to 3L/[place]. While resting Sp02 reading 92%. Pt then ambulated down the hall and back (approximately 40 ft. ) with monitoring, 02 and stand by assist. Sp02 dropped to 85%. Pts HR 120s, pts breathing became more labored. Pt returned to room and 02 climbed back to 90 %. EMD notified.

## 2018-07-17 NOTE — Discharge Planning (Signed)
Fuller Mandril, RN, BSN, [place] [phone] Pt came to ED without oxygen tank.  EDCM arranged for [facility] to bring portable tank for transportation home.

## 2019-12-26 ENCOUNTER — Other Ambulatory Visit: Payer: Self-pay

## 2019-12-26 ENCOUNTER — Inpatient Hospital Stay (HOSPITAL_COMMUNITY)
Admission: EM | Admit: 2019-12-26 | Discharge: 2019-12-29 | DRG: 190 | Disposition: A | Discharge status: Home / Self Care | Payer: Medicare PPO | Attending: Internal Medicine | Admitting: Internal Medicine

## 2019-12-26 ENCOUNTER — Encounter (HOSPITAL_COMMUNITY): Payer: Self-pay

## 2019-12-26 DIAGNOSIS — J441 Chronic obstructive pulmonary disease with (acute) exacerbation: Principal | ICD-10-CM | POA: Diagnosis present

## 2019-12-26 DIAGNOSIS — E871 Hypo-osmolality and hyponatremia: Secondary | ICD-10-CM | POA: Diagnosis present

## 2019-12-26 DIAGNOSIS — M545 Low back pain: Secondary | ICD-10-CM | POA: Diagnosis present

## 2019-12-26 DIAGNOSIS — G8929 Other chronic pain: Secondary | ICD-10-CM | POA: Diagnosis present

## 2019-12-26 DIAGNOSIS — J449 Chronic obstructive pulmonary disease, unspecified: Secondary | ICD-10-CM | POA: Diagnosis present

## 2019-12-26 DIAGNOSIS — Z880 Allergy status to penicillin: Secondary | ICD-10-CM

## 2019-12-26 DIAGNOSIS — I739 Peripheral vascular disease, unspecified: Secondary | ICD-10-CM | POA: Diagnosis present

## 2019-12-26 DIAGNOSIS — R0902 Hypoxemia: Secondary | ICD-10-CM

## 2019-12-26 DIAGNOSIS — I480 Paroxysmal atrial fibrillation: Secondary | ICD-10-CM | POA: Diagnosis present

## 2019-12-26 DIAGNOSIS — K219 Gastro-esophageal reflux disease without esophagitis: Secondary | ICD-10-CM | POA: Diagnosis present

## 2019-12-26 DIAGNOSIS — E860 Dehydration: Secondary | ICD-10-CM | POA: Diagnosis present

## 2019-12-26 DIAGNOSIS — Z7982 Long term (current) use of aspirin: Secondary | ICD-10-CM

## 2019-12-26 DIAGNOSIS — Z823 Family history of stroke: Secondary | ICD-10-CM

## 2019-12-26 DIAGNOSIS — I10 Essential (primary) hypertension: Secondary | ICD-10-CM | POA: Diagnosis present

## 2019-12-26 DIAGNOSIS — R000 Tachycardia, unspecified: Secondary | ICD-10-CM | POA: Diagnosis present

## 2019-12-26 DIAGNOSIS — Z8042 Family history of malignant neoplasm of prostate: Secondary | ICD-10-CM

## 2019-12-26 DIAGNOSIS — Z888 Allergy status to other drugs, medicaments and biological substances status: Secondary | ICD-10-CM

## 2019-12-26 DIAGNOSIS — Z831 Family history of other infectious and parasitic diseases: Secondary | ICD-10-CM

## 2019-12-26 DIAGNOSIS — Z79899 Other long term (current) drug therapy: Secondary | ICD-10-CM

## 2019-12-26 DIAGNOSIS — Z9981 Dependence on supplemental oxygen: Secondary | ICD-10-CM

## 2019-12-26 DIAGNOSIS — I712 Thoracic aortic aneurysm, without rupture: Secondary | ICD-10-CM | POA: Diagnosis present

## 2019-12-26 DIAGNOSIS — N179 Acute kidney failure, unspecified: Secondary | ICD-10-CM

## 2019-12-26 DIAGNOSIS — Z85118 Personal history of other malignant neoplasm of bronchus and lung: Secondary | ICD-10-CM

## 2019-12-26 DIAGNOSIS — Z20822 Contact with and (suspected) exposure to covid-19: Secondary | ICD-10-CM | POA: Diagnosis present

## 2019-12-26 DIAGNOSIS — E785 Hyperlipidemia, unspecified: Secondary | ICD-10-CM | POA: Diagnosis present

## 2019-12-26 DIAGNOSIS — Z87891 Personal history of nicotine dependence: Secondary | ICD-10-CM

## 2019-12-26 DIAGNOSIS — J440 Chronic obstructive pulmonary disease with acute lower respiratory infection: Secondary | ICD-10-CM | POA: Diagnosis present

## 2019-12-26 DIAGNOSIS — J189 Pneumonia, unspecified organism: Secondary | ICD-10-CM | POA: Diagnosis present

## 2019-12-26 DIAGNOSIS — J9621 Acute and chronic respiratory failure with hypoxia: Secondary | ICD-10-CM | POA: Diagnosis present

## 2019-12-26 DIAGNOSIS — Z902 Acquired absence of lung [part of]: Secondary | ICD-10-CM

## 2019-12-26 DIAGNOSIS — G4733 Obstructive sleep apnea (adult) (pediatric): Secondary | ICD-10-CM | POA: Diagnosis present

## 2019-12-26 DIAGNOSIS — F03918 Unspecified dementia, unspecified severity, with other behavioral disturbance: Secondary | ICD-10-CM | POA: Diagnosis present

## 2019-12-26 DIAGNOSIS — Z8249 Family history of ischemic heart disease and other diseases of the circulatory system: Secondary | ICD-10-CM

## 2019-12-26 DIAGNOSIS — F0391 Unspecified dementia with behavioral disturbance: Secondary | ICD-10-CM | POA: Diagnosis present

## 2019-12-26 DIAGNOSIS — Z7951 Long term (current) use of inhaled steroids: Secondary | ICD-10-CM

## 2019-12-26 DIAGNOSIS — N400 Enlarged prostate without lower urinary tract symptoms: Secondary | ICD-10-CM | POA: Diagnosis present

## 2019-12-26 HISTORY — DX: Chronic respiratory failure with hypoxia: J96.11

## 2019-12-26 LAB — CBC
HCT: 40.7 % (ref 39.0–52.0)
Hemoglobin: 13.5 g/dL (ref 13.0–17.0)
MCH: 30.3 pg (ref 26.0–34.0)
MCHC: 33.2 g/dL (ref 30.0–36.0)
MCV: 91.3 fL (ref 80.0–100.0)
Platelets: 265 10*3/uL (ref 150–400)
RBC: 4.46 MIL/uL (ref 4.22–5.81)
RDW: 13.6 % (ref 11.5–15.5)
WBC: 11.5 10*3/uL — ABNORMAL HIGH (ref 4.0–10.5)
nRBC: 0 % (ref 0.0–0.2)

## 2019-12-26 MED ORDER — SODIUM CHLORIDE 0.9% FLUSH
3.0000 mL | Freq: Once | INTRAVENOUS | Status: AC
  Administered 2019-12-27: 01:00:00 3 mL via INTRAVENOUS

## 2019-12-26 NOTE — ED Triage Notes (Signed)
Pt bib [facility] from home w/ c/o SOB. Pt has hx of COPD but does not wear home O2. On EMS arrival pt wheezing and 89% on RA. Pt received supplemental O2, two duonebs, and 125 mg solumedrol. On arrival to ED pt 97% on RA. Pt endorses productive cough. Pt has hx of dementia and at baseline neuro status (AOx2). Hx of A-fib, rate controlled w/ EMS. All other VSS.

## 2019-12-27 ENCOUNTER — Observation Stay (HOSPITAL_COMMUNITY): Payer: Medicare PPO

## 2019-12-27 ENCOUNTER — Emergency Department (HOSPITAL_COMMUNITY): Payer: Medicare PPO

## 2019-12-27 ENCOUNTER — Encounter (HOSPITAL_COMMUNITY): Payer: Self-pay | Admitting: Internal Medicine

## 2019-12-27 DIAGNOSIS — M545 Low back pain: Secondary | ICD-10-CM | POA: Diagnosis present

## 2019-12-27 DIAGNOSIS — F03918 Unspecified dementia, unspecified severity, with other behavioral disturbance: Secondary | ICD-10-CM | POA: Diagnosis present

## 2019-12-27 DIAGNOSIS — G4733 Obstructive sleep apnea (adult) (pediatric): Secondary | ICD-10-CM | POA: Diagnosis present

## 2019-12-27 DIAGNOSIS — E871 Hypo-osmolality and hyponatremia: Secondary | ICD-10-CM | POA: Diagnosis not present

## 2019-12-27 DIAGNOSIS — K219 Gastro-esophageal reflux disease without esophagitis: Secondary | ICD-10-CM | POA: Diagnosis present

## 2019-12-27 DIAGNOSIS — E785 Hyperlipidemia, unspecified: Secondary | ICD-10-CM | POA: Diagnosis present

## 2019-12-27 DIAGNOSIS — Z8249 Family history of ischemic heart disease and other diseases of the circulatory system: Secondary | ICD-10-CM | POA: Diagnosis not present

## 2019-12-27 DIAGNOSIS — N400 Enlarged prostate without lower urinary tract symptoms: Secondary | ICD-10-CM | POA: Diagnosis present

## 2019-12-27 DIAGNOSIS — J9621 Acute and chronic respiratory failure with hypoxia: Secondary | ICD-10-CM | POA: Diagnosis present

## 2019-12-27 DIAGNOSIS — I739 Peripheral vascular disease, unspecified: Secondary | ICD-10-CM | POA: Diagnosis present

## 2019-12-27 DIAGNOSIS — J441 Chronic obstructive pulmonary disease with (acute) exacerbation: Principal | ICD-10-CM

## 2019-12-27 DIAGNOSIS — Z831 Family history of other infectious and parasitic diseases: Secondary | ICD-10-CM | POA: Diagnosis not present

## 2019-12-27 DIAGNOSIS — Z87891 Personal history of nicotine dependence: Secondary | ICD-10-CM | POA: Diagnosis not present

## 2019-12-27 DIAGNOSIS — Z85118 Personal history of other malignant neoplasm of bronchus and lung: Secondary | ICD-10-CM | POA: Diagnosis not present

## 2019-12-27 DIAGNOSIS — G8929 Other chronic pain: Secondary | ICD-10-CM | POA: Diagnosis present

## 2019-12-27 DIAGNOSIS — Z880 Allergy status to penicillin: Secondary | ICD-10-CM | POA: Diagnosis not present

## 2019-12-27 DIAGNOSIS — J189 Pneumonia, unspecified organism: Secondary | ICD-10-CM

## 2019-12-27 DIAGNOSIS — F0391 Unspecified dementia with behavioral disturbance: Secondary | ICD-10-CM

## 2019-12-27 DIAGNOSIS — Z888 Allergy status to other drugs, medicaments and biological substances status: Secondary | ICD-10-CM | POA: Diagnosis not present

## 2019-12-27 DIAGNOSIS — J449 Chronic obstructive pulmonary disease, unspecified: Secondary | ICD-10-CM | POA: Diagnosis present

## 2019-12-27 DIAGNOSIS — I480 Paroxysmal atrial fibrillation: Secondary | ICD-10-CM | POA: Diagnosis present

## 2019-12-27 DIAGNOSIS — Z20822 Contact with and (suspected) exposure to covid-19: Secondary | ICD-10-CM | POA: Diagnosis present

## 2019-12-27 DIAGNOSIS — Z823 Family history of stroke: Secondary | ICD-10-CM | POA: Diagnosis not present

## 2019-12-27 DIAGNOSIS — J440 Chronic obstructive pulmonary disease with acute lower respiratory infection: Secondary | ICD-10-CM | POA: Diagnosis present

## 2019-12-27 DIAGNOSIS — I10 Essential (primary) hypertension: Secondary | ICD-10-CM | POA: Diagnosis present

## 2019-12-27 DIAGNOSIS — Z902 Acquired absence of lung [part of]: Secondary | ICD-10-CM | POA: Diagnosis not present

## 2019-12-27 LAB — POCT I-STAT 7, (LYTES, BLD GAS, ICA,H+H)
Acid-Base Excess: 4 mmol/L — ABNORMAL HIGH (ref 0.0–2.0)
Bicarbonate: 27.4 mmol/L (ref 20.0–28.0)
Calcium, Ion: 1.14 mmol/L — ABNORMAL LOW (ref 1.15–1.40)
HCT: 38 % — ABNORMAL LOW (ref 39.0–52.0)
Hemoglobin: 12.9 g/dL — ABNORMAL LOW (ref 13.0–17.0)
O2 Saturation: 98 %
Patient temperature: 97.4
Potassium: 3.4 mmol/L — ABNORMAL LOW (ref 3.5–5.1)
Sodium: 134 mmol/L — ABNORMAL LOW (ref 135–145)
TCO2: 28 mmol/L (ref 22–32)
pCO2 arterial: 34.8 mmHg (ref 32.0–48.0)
pH, Arterial: 7.501 — ABNORMAL HIGH (ref 7.350–7.450)
pO2, Arterial: 98 mmHg (ref 83.0–108.0)

## 2019-12-27 LAB — BASIC METABOLIC PANEL
Anion gap: 14 (ref 5–15)
BUN: 20 mg/dL (ref 8–23)
CO2: 27 mmol/L (ref 22–32)
Calcium: 8.9 mg/dL (ref 8.9–10.3)
Chloride: 92 mmol/L — ABNORMAL LOW (ref 98–111)
Creatinine, Ser: 1.26 mg/dL — ABNORMAL HIGH (ref 0.61–1.24)
GFR calc Af Amer: >60 mL/min (ref >60)
GFR calc non Af Amer: 57 mL/min — ABNORMAL LOW (ref >60)
Glucose, Bld: 118 mg/dL — ABNORMAL HIGH (ref 70–99)
Potassium: 3.5 mmol/L (ref 3.5–5.1)
Sodium: 133 mmol/L — ABNORMAL LOW (ref 135–145)

## 2019-12-27 LAB — SARS CORONAVIRUS 2 (TAT 6-24 HRS): SARS Coronavirus 2: NEGATIVE

## 2019-12-27 LAB — TROPONIN I (HIGH SENSITIVITY)
Troponin I (High Sensitivity): 6 ng/L (ref <18)
Troponin I (High Sensitivity): 6 ng/L (ref <18)

## 2019-12-27 LAB — POC SARS CORONAVIRUS 2 AG -  ED: SARS Coronavirus 2 Ag: NEGATIVE

## 2019-12-27 LAB — HIV ANTIBODY (ROUTINE TESTING W REFLEX): HIV Screen 4th Generation wRfx: NONREACTIVE

## 2019-12-27 MED ORDER — HALOPERIDOL LACTATE 5 MG/ML IJ SOLN: 5.0000 mg | Freq: Once | INTRAMUSCULAR | Status: AC

## 2019-12-27 MED ORDER — SODIUM CHLORIDE 0.9 % IV BOLUS
500.0000 mL | Freq: Once | INTRAVENOUS | Status: AC
  Administered 2019-12-27: 500 mL via INTRAVENOUS

## 2019-12-27 MED ORDER — DILTIAZEM HCL ER COATED BEADS 240 MG PO CP24
240.0000 mg | ORAL_CAPSULE | Freq: Every day | ORAL | Status: DC
  Administered 2019-12-27 – 2019-12-29 (×3): 240 mg via ORAL
  Filled 2019-12-27 (×3): qty 1

## 2019-12-27 MED ORDER — ATORVASTATIN CALCIUM 10 MG PO TABS
20.0000 mg | ORAL_TABLET | Freq: Every day | ORAL | Status: DC
  Administered 2019-12-27 – 2019-12-29 (×3): 20 mg via ORAL
  Filled 2019-12-27 (×3): qty 2

## 2019-12-27 MED ORDER — PANTOPRAZOLE SODIUM 40 MG PO TBEC
40.0000 mg | DELAYED_RELEASE_TABLET | Freq: Every day | ORAL | Status: DC
  Administered 2019-12-27 – 2019-12-29 (×3): 40 mg via ORAL
  Filled 2019-12-27 (×3): qty 1

## 2019-12-27 MED ORDER — ONDANSETRON HCL 4 MG/2ML IJ SOLN: 4.0000 mg | Freq: Four times a day (QID) | INTRAMUSCULAR | Status: DC | PRN

## 2019-12-27 MED ORDER — IPRATROPIUM-ALBUTEROL 0.5-2.5 (3) MG/3ML IN SOLN
3.0000 mL | Freq: Four times a day (QID) | RESPIRATORY_TRACT | Status: DC
  Administered 2019-12-27 (×3): 3 mL via RESPIRATORY_TRACT
  Filled 2019-12-27 (×3): qty 3

## 2019-12-27 MED ORDER — IPRATROPIUM-ALBUTEROL 0.5-2.5 (3) MG/3ML IN SOLN
3.0000 mL | Freq: Three times a day (TID) | RESPIRATORY_TRACT | Status: DC
  Administered 2019-12-28 – 2019-12-29 (×4): 3 mL via RESPIRATORY_TRACT
  Filled 2019-12-27 (×4): qty 3

## 2019-12-27 MED ORDER — METHYLPREDNISOLONE SODIUM SUCC 125 MG IJ SOLR
60.0000 mg | Freq: Two times a day (BID) | INTRAMUSCULAR | Status: DC
  Filled 2019-12-27: qty 2

## 2019-12-27 MED ORDER — POTASSIUM CHLORIDE IN NACL 20-0.9 MEQ/L-% IV SOLN
INTRAVENOUS | Status: AC
  Filled 2019-12-27 (×2): qty 1000

## 2019-12-27 MED ORDER — HALOPERIDOL LACTATE 5 MG/ML IJ SOLN
INTRAMUSCULAR | Status: AC
  Administered 2019-12-27: 5 mg via INTRAVENOUS
  Filled 2019-12-27: qty 1

## 2019-12-27 MED ORDER — ONDANSETRON HCL 4 MG PO TABS: 4.0000 mg | ORAL_TABLET | Freq: Four times a day (QID) | ORAL | Status: DC | PRN

## 2019-12-27 MED ORDER — SODIUM CHLORIDE 0.9 % IV SOLN
1.0000 g | Freq: Once | INTRAVENOUS | Status: AC
  Administered 2019-12-27: 1 g via INTRAVENOUS
  Filled 2019-12-27: qty 10

## 2019-12-27 MED ORDER — SODIUM CHLORIDE 0.9 % IV SOLN
500.0000 mg | Freq: Once | INTRAVENOUS | Status: AC
  Administered 2019-12-27: 500 mg via INTRAVENOUS
  Filled 2019-12-27: qty 500

## 2019-12-27 MED ORDER — SODIUM CHLORIDE 0.9 % IV SOLN
500.0000 mg | INTRAVENOUS | Status: DC
  Administered 2019-12-28 – 2019-12-29 (×2): 500 mg via INTRAVENOUS
  Filled 2019-12-27 (×2): qty 500

## 2019-12-27 MED ORDER — ACETAMINOPHEN 650 MG RE SUPP: 650.0000 mg | Freq: Four times a day (QID) | RECTAL | Status: DC | PRN

## 2019-12-27 MED ORDER — DABIGATRAN ETEXILATE MESYLATE 150 MG PO CAPS
150.0000 mg | ORAL_CAPSULE | Freq: Two times a day (BID) | ORAL | Status: DC
  Administered 2019-12-27 – 2019-12-29 (×5): 150 mg via ORAL
  Filled 2019-12-27 (×6): qty 1

## 2019-12-27 MED ORDER — LORAZEPAM 2 MG/ML IJ SOLN
INTRAMUSCULAR | Status: AC
  Administered 2019-12-27: 1 mg via INTRAVENOUS
  Filled 2019-12-27: qty 1

## 2019-12-27 MED ORDER — LORAZEPAM 2 MG/ML IJ SOLN: 1.0000 mg | Freq: Once | INTRAMUSCULAR | Status: AC

## 2019-12-27 MED ORDER — SODIUM CHLORIDE 0.9 % IV SOLN
1.0000 g | INTRAVENOUS | Status: DC
  Administered 2019-12-28 – 2019-12-29 (×2): 1 g via INTRAVENOUS
  Filled 2019-12-27 (×2): qty 1

## 2019-12-27 MED ORDER — ALBUTEROL SULFATE (2.5 MG/3ML) 0.083% IN NEBU
5.0000 mg | INHALATION_SOLUTION | Freq: Once | RESPIRATORY_TRACT | Status: AC
  Administered 2019-12-27: 02:00:00 2.5 mg via RESPIRATORY_TRACT
  Filled 2019-12-27: qty 6

## 2019-12-27 MED ORDER — GABAPENTIN 300 MG PO CAPS
300.0000 mg | ORAL_CAPSULE | Freq: Every morning | ORAL | Status: DC
  Administered 2019-12-27 – 2019-12-29 (×3): 300 mg via ORAL
  Filled 2019-12-27 (×3): qty 1

## 2019-12-27 MED ORDER — ACETAMINOPHEN 325 MG PO TABS: 650.0000 mg | ORAL_TABLET | ORAL | Status: DC | PRN

## 2019-12-27 MED ORDER — METHYLPREDNISOLONE SODIUM SUCC 125 MG IJ SOLR
60.0000 mg | Freq: Two times a day (BID) | INTRAMUSCULAR | Status: DC
  Administered 2019-12-27 – 2019-12-29 (×5): 60 mg via INTRAVENOUS
  Filled 2019-12-27 (×5): qty 2

## 2019-12-27 MED ORDER — IPRATROPIUM-ALBUTEROL 0.5-2.5 (3) MG/3ML IN SOLN
3.0000 mL | RESPIRATORY_TRACT | Status: DC | PRN
  Administered 2019-12-27 – 2019-12-28 (×2): 3 mL via RESPIRATORY_TRACT
  Filled 2019-12-27 (×2): qty 3

## 2019-12-27 MED ORDER — GABAPENTIN 300 MG PO CAPS
600.0000 mg | ORAL_CAPSULE | Freq: Every day | ORAL | Status: DC
  Administered 2019-12-27 – 2019-12-28 (×2): 600 mg via ORAL
  Filled 2019-12-27 (×2): qty 2

## 2019-12-27 MED ORDER — MEMANTINE HCL 10 MG PO TABS
10.0000 mg | ORAL_TABLET | Freq: Two times a day (BID) | ORAL | Status: DC
  Administered 2019-12-27 – 2019-12-29 (×4): 10 mg via ORAL
  Filled 2019-12-27 (×5): qty 1

## 2019-12-27 MED ORDER — FINASTERIDE 5 MG PO TABS
5.0000 mg | ORAL_TABLET | Freq: Every day | ORAL | Status: DC
  Administered 2019-12-27 – 2019-12-29 (×3): 5 mg via ORAL
  Filled 2019-12-27 (×3): qty 1

## 2019-12-27 MED ORDER — POLYETHYLENE GLYCOL 3350 17 G PO PACK: 17.0000 g | PACK | Freq: Every day | ORAL | Status: DC | PRN

## 2019-12-27 MED ORDER — LISINOPRIL 20 MG PO TABS
20.0000 mg | ORAL_TABLET | Freq: Every day | ORAL | Status: DC
  Administered 2019-12-27 – 2019-12-29 (×3): 20 mg via ORAL
  Filled 2019-12-27 (×3): qty 1

## 2019-12-27 MED ORDER — IPRATROPIUM BROMIDE 0.02 % IN SOLN
0.5000 mg | Freq: Once | RESPIRATORY_TRACT | Status: AC
  Administered 2019-12-27: 02:00:00 0.5 mg via RESPIRATORY_TRACT
  Filled 2019-12-27: qty 2.5

## 2019-12-27 NOTE — ED Notes (Signed)
Ambulated to bathroom with one assist.

## 2019-12-27 NOTE — ED Notes (Signed)
Daughter at bedside.  Updated on results and POC.

## 2019-12-27 NOTE — ED Notes (Signed)
Dinner ordered 

## 2019-12-27 NOTE — ED Provider Notes (Signed)
[facility] EMERGENCY DEPARTMENT Provider Note   CSN: [id number] Arrival date & time: 12/26/19  2326     History Chief Complaint  Patient presents with  . Shortness of Breath    James [name] is a 69 y.o. male with a hx of hypertension, hyperlipidemia, paroxysmal A. fib on Pradaxa, COPD presents to the Emergency Department via EMS for shortness of breath.  Patient alert to person and place but cannot orient to time events.  He does not know why he is here in the emergency department.  Level 5 CAVEAT for dementia.   Per EMS, they were called out for shortness of breath for patient with a history of COPD that does not wear oxygen at home.  On arrival they found the patient wheezing with an oxygen saturation of 89% on room air.  In their care patient received supplemental oxygen, DuoNeb x2 and Solu-Medrol 125 mg IV.  On arrival patient with oxygen saturation of 97% on room air.      The history is provided by the patient, medical records and the EMS personnel. The history is limited by the condition of the patient. No language interpreter was used.       Past Medical History:  Diagnosis Date  . Aorta aneurysm (HCC)    4.2 ascending thoracic aortic aneursym without evidence of abdominal aortic aneurysm on CTA 11/07/12  . Aortic aneurysm ([place])    "unrepaired; has it checked q yr" (05/12/2013)  . BPH (benign prostatic hypertrophy)   . Cancer of lung [facility])    s/p resection in 2005 at [facility] / [place] patient  . Chronic back pain started in 1990's  . Chronic lower back pain   . Colitis   . COPD (chronic obstructive pulmonary disease) ([place])   . Exertional shortness of breath   . Exposure to Agent Orange    [place] War  . GERD (gastroesophageal reflux disease)   . High cholesterol   . Hypertension   . OSA on CPAP   . PAD (peripheral artery disease) [facility])     Patient Active Problem List   Diagnosis Date Noted  . SBO (small bowel obstruction) ([place]) 01/30/2016  .  Pre-operative cardiovascular examination, supraventricular arrhythmia 04/04/2013  . Shortness of breath   . Post-operative state 12/02/2012  . Ischemic colitis ([place]) 11/13/2012  . Emesis 11/07/2012  . Diarrhea 11/07/2012  . Swollen upper lip 11/07/2012  . Paroxysmal a-fib, spont conversion to NSR 11/07/2012  . COPD (chronic obstructive pulmonary disease) ([place]) 11/07/2012  . Hyperglycemia, stress related 11/07/2012  . Elevated alkaline phosphatase level 11/07/2012  . SIRS criteria on admission (leukocytosis, hypotension, hypothermia) 11/07/2012  . Enteritis 11/07/2012  . HYPERLIPIDEMIA 11/01/2009  . HYPERTENSION 11/01/2009  . PERIPHERAL VASCULAR DISEASE 11/01/2009  . GERD 11/01/2009  . BENIGN PROSTATIC HYPERTROPHY 11/01/2009  . HERNIATED LUMBAR DISC 11/01/2009  . LOW BACK PAIN 11/01/2009  . LUNG CANCER, HX OF 11/01/2009  . DIVERTICULITIS, HX OF 11/01/2009    Past Surgical History:  Procedure Laterality Date  . COLON SURGERY    . COLOSTOMY  11/08/2012  . COLOSTOMY CLOSURE N/A 05/12/2013   Procedure: Exploratory Laparotomy, Lysis of adhesions, Colon resection, Proctoscope, COLOSTOMY CLOSURE;  Surgeon: Joyice Faster. Cornett, MD;  Location: [place];  Service: General;  Laterality: N/A;  yellow fin stirrups   . COLOSTOMY REVERSAL  05/12/2013  . FEMORAL ARTERY STENT Right ?2002  . LAPAROTOMY N/A 11/08/2012   Procedure: EXPLORATORY LAPAROTOMY with descending and sigmoid colectomy; wound vac placement  and colostomy;  Surgeon: Joyice Faster. Cornett, MD;  Location: [place];  Service: General;  Laterality: N/A;  . LUNG LOBECTOMY Left 2005   "upper lobe; bronchial tube replaced w/piece of lining of his rib" (05/12/2013)       Family History  Problem Relation Age of Onset  . Heart attack Mother   . Tuberculosis Mother   . Heart attack Father   . Stroke Father   . Heart attack Brother   . Heart disease Sister   . Cancer Brother        Prostate    Social History   Tobacco Use  . Smoking status:  Former Smoker    Packs/day: 1.00    Years: 30.00    Pack years: 30.00    Types: Cigarettes    Quit date: 10/02/2003    Years since quitting: 16.2  . Smokeless tobacco: Current User    Types: Chew  Substance Use Topics  . Alcohol use: No  . Drug use: No    Home Medications Prior to Admission medications   Medication Sig Start Date End Date Taking? Authorizing Provider  albuterol (PROVENTIL HFA;VENTOLIN HFA) 108 (90 BASE) MCG/ACT inhaler Inhale 2 puffs into the lungs every 6 (six) hours as needed for wheezing or shortness of breath. For shortness of breath    [provider]  aspirin 81 MG tablet Take 81 mg by mouth daily.    [provider]  atorvastatin (LIPITOR) 40 MG tablet Take 20 mg by mouth daily.    [provider]  budesonide-formoterol (SYMBICORT) 160-4.5 MCG/ACT inhaler Inhale 2 puffs into the lungs 2 (two) times daily.    [provider]  dabigatran (PRADAXA) 150 MG CAPS capsule Take 150 mg by mouth 2 (two) times daily. At 10:00 am 2200 pm    [provider]  diltiazem (TIAZAC) 240 MG 24 hr capsule Take 240 mg by mouth 2 (two) times daily.    [provider]  doxycycline (VIBRAMYCIN) 100 MG capsule Take 1 capsule (100 mg total) by mouth 2 (two) times daily. 07/17/18   Hayden Rasmussen, MD  finasteride (PROSCAR) 5 MG tablet Take 5 mg by mouth daily.    [provider]  fluticasone (FLONASE) 50 MCG/ACT nasal spray Place 2 sprays into both nostrils 2 (two) times daily.     [provider]  gabapentin (NEURONTIN) 300 MG capsule Take 300-600 mg by mouth 2 (two) times daily. 300 mg in morning and 600 mg in the evening    [provider]  guaiFENesin (MUCINEX) 600 MG 12 hr tablet Take 600 mg by mouth daily.    [provider]  HYDROcodone-acetaminophen (NORCO) 10-325 MG per tablet Take 1 tablet by mouth 4 (four) times daily.     [provider]  levofloxacin (LEVAQUIN) 750 MG tablet Take  1 tablet (750 mg total) by mouth daily. Patient not taking: Reported on 07/17/2018 05/19/13   Erroll Luna, MD  lisinopril-hydrochlorothiazide (PRINZIDE,ZESTORETIC) 20-25 MG tablet Take 1 tablet by mouth daily.    [provider]  loratadine (CLARITIN) 10 MG tablet Take 10 mg by mouth daily.    [provider]  omeprazole (PRILOSEC OTC) 20 MG tablet Take 20 mg by mouth 2 (two) times daily as needed (Acid reflux).     [provider]  omeprazole (PRILOSEC) 20 MG capsule Take 20 mg by mouth daily.    [provider]  ondansetron (ZOFRAN ODT) 4 MG disintegrating tablet Take 1 tablet (4  mg total) by mouth every 6 (six) hours as needed for nausea or vomiting. Patient not taking: Reported on 07/17/2018 01/30/16   Julianne Rice, MD  OVER THE COUNTER MEDICATION Take 1 tablet by mouth daily. CPAP    [provider]  oxyCODONE-acetaminophen (ROXICET) 5-325 MG per tablet Take 1 tablet by mouth every 4 (four) hours as needed for pain. Patient not taking: Reported on 07/17/2018 05/19/13   Erroll Luna, MD  OXYGEN Inhale 3 L into the lungs as needed (shortness of breath).    [provider]  predniSONE (DELTASONE) 20 MG tablet Take 3 tablets (60 mg total) by mouth daily. 07/17/18   Hayden Rasmussen, MD  senna-docusate (SENOKOT-S) 8.6-50 MG tablet Take 2 tablets by mouth daily at 12 noon.    [provider]  tiotropium (SPIRIVA) 18 MCG inhalation capsule Place 18 mcg into inhaler and inhale daily.    [provider]    Allergies    Amlodipine besy-benazepril hcl, Meloxicam, and Penicillins  Review of Systems   Review of Systems  Unable to perform ROS: Dementia  Respiratory: Positive for cough, chest tightness, shortness of breath and wheezing.     Physical Exam Updated Vital Signs BP 122/62 (BP Location: Right Arm)   Pulse 87   Temp 98.4 F (36.9 C) (Oral)   Resp 18   SpO2 93%   Physical Exam Vitals and nursing note  reviewed.  Constitutional:      General: He is not in acute distress.    Appearance: He is not diaphoretic.  HENT:     Head: Normocephalic.  Eyes:     General: No scleral icterus.    Conjunctiva/sclera: Conjunctivae normal.  Cardiovascular:     Rate and Rhythm: Normal rate and regular rhythm.     Pulses: Normal pulses.          Radial pulses are 2+ on the right side and 2+ on the left side.  Pulmonary:     Effort: No tachypnea, accessory muscle usage, prolonged expiration, respiratory distress or retractions.     Breath sounds: No stridor. Wheezing ( throughout) and rhonchi (throughout) present.     Comments: Equal chest rise. No increased work of breathing. Congested cough Abdominal:     General: There is no distension.     Palpations: Abdomen is soft.     Tenderness: There is no abdominal tenderness. There is no guarding or rebound.  Musculoskeletal:     Cervical back: Normal range of motion.     Comments: Moves all extremities equally and without difficulty.  Skin:    General: Skin is warm and dry.     Capillary Refill: Capillary refill takes less than 2 seconds.  Neurological:     Mental Status: He is alert.     GCS: GCS eye subscore is 4. GCS verbal subscore is 5. GCS motor subscore is 6.     Comments: Speech is clear and goal oriented.  Psychiatric:        Mood and Affect: Mood normal.     ED Results / Procedures / Treatments   Labs (all labs ordered are listed, but only abnormal results are displayed) Labs Reviewed  BASIC METABOLIC PANEL - Abnormal; Notable for the following components:      Result Value   Sodium 133 (*)    Chloride 92 (*)    Glucose, Bld 118 (*)    Creatinine, Ser 1.26 (*)    GFR calc non Af Amer 57 (*)  All other components within normal limits  CBC - Abnormal; Notable for the following components:   WBC 11.5 (*)    All other components within normal limits  SARS CORONAVIRUS 2 (TAT 6-24 HRS)  POC SARS CORONAVIRUS 2 AG -  ED  TROPONIN  I (HIGH SENSITIVITY)  TROPONIN I (HIGH SENSITIVITY)    EKG EKG Interpretation  Date/Time:  Saturday December 26 2019 23:27:36 EDT Ventricular Rate:  106 PR Interval:    QRS Duration: 100 QT Interval:  360 QTC Calculation: 478 R Axis:   92 Text Interpretation: Atrial fibrillation with rapid ventricular response Rightward axis Nonspecific ST abnormality Abnormal ECG No significant change since last tracing Confirmed by Deno Etienne [phone]) on 12/27/2019 12:04:57 AM   Radiology DG Chest Port 1 View  Result Date: 12/27/2019 CLINICAL DATA:  Cough and fever. Wheezing. EXAM: PORTABLE CHEST 1 VIEW COMPARISON:  Radiograph 07/07/2018 FINDINGS: Lungs are hyperinflated with emphysema. Postsurgical change in the right hemithorax with surgical clips and sutures at the hilum mid and upper lung zone. Heart is normal in size with normal mediastinal contours. Streaky opacities at the left lung base most consistent with atelectasis. No confluent airspace disease. Mild biapical pleuroparenchymal scarring. No pneumothorax or large pleural effusion. No acute osseous abnormalities are seen. IMPRESSION: 1. Streaky left lung base opacities most consistent with atelectasis. 2. Emphysema with postsurgical change in the right hemithorax. Electronically Signed   By: Keith Rake M.D.   On: 12/27/2019 00:55    Procedures Procedures (including critical care time)  Medications Ordered in ED Medications  albuterol (PROVENTIL) (2.5 MG/3ML) 0.083% nebulizer solution 5 mg (has no administration in time range)  ipratropium (ATROVENT) nebulizer solution 0.5 mg (has no administration in time range)  sodium chloride 0.9 % bolus 500 mL (has no administration in time range)  cefTRIAXone (ROCEPHIN) 1 g in sodium chloride 0.9 % 100 mL IVPB (has no administration in time range)  azithromycin (ZITHROMAX) 500 mg in sodium chloride 0.9 % 250 mL IVPB (has no administration in time range)  sodium chloride flush (NS) 0.9 % injection 3  mL (3 mLs Intravenous Given 12/27/19 0039)    ED Course  I have reviewed the triage vital signs and the nursing notes.  Pertinent labs & imaging results that were available during my care of the patient were reviewed by me and considered in my medical decision making (see chart for details).  Clinical Course as of Dec 27 211  Sun Dec 27, 2019  0104 Attempted to call wife - voicemail message left.    [HM]  0112 Left lung base with questionable atelectasis.  When compared to chest x-ray in 2019 it was not present.  Given reports of increased shortness of breath and cough I do have concern for new onset pneumonia.  DG Chest Port 1 View [HM]  [phone] Creatinine slightly elevated above baseline.  Mild hyponatremia and hypochloremia.  Give fluids.  Creatinine(!): 1.26 [HM]  0114 Leukocytosis noted.  In the setting of questionable pneumonia consider this real.  WBC(!): 11.5 [HM]  0207 WNL  Troponin I (High Sensitivity): 6 [HM]  0207 Pt dropped his oxygen saturation to 88% on room air.  He was alert at that time.  Placed on 2 L via nasal cannula.  He will need admission  SpO2(!): 88 % [HM]    Clinical Course User Index [HM] Jo-Ann Johanning, Gwenlyn Perking   MDM Rules/Calculators/A&P  Patient presents for reported shortness of breath and hypoxia.  On my evaluation wheezing with congested cough.  Patient with history of COPD.  Additional albuterol given here in the emergency department.  Patient with hypoxia at 88% on room air.  He was placed on 2 L via nasal cannula with adequate improvement.  He does not wear oxygen at home.  Patient with dementia and unable to provide history.  Multiple attempts to reach patient's wife have been unsuccessful.  Chest x-ray with questionable atelectasis versus pneumonia.  Given that linear opacity is new from previous chest x-ray will treat as community-acquired pneumonia.  Patient will need admission for new onset hypoxia.  The patient was  discussed with and seen by Dr. Tyrone Nine who agrees with the treatment plan.  2:13 AM Discussed patient's case with hospitalist, Dr. Cyd Silence.  I have recommended admission and patient (and family if present) agree with this plan. Admitting physician will place admission orders.    Final Clinical Impression(s) / ED Diagnoses Final diagnoses:  COPD exacerbation ([place])  Community acquired pneumonia of left lower lobe of lung  Hypoxia    Rx / DC Orders ED Discharge Orders    None       Gram Siedlecki, Gwenlyn Perking 12/27/19 [place], [place], DO 12/27/19 [phone]

## 2019-12-27 NOTE — Progress Notes (Addendum)
Patient to the hospital after midnight.  Please see H&P.  Patient has dementia and lives with his wife.  He has been found to have acute respiratory failure due to a COPD exacerbation.  Patient is still wheezing-plan to dose him with IV steroids.  There was also questionable pneumonia on his x-ray so IV antibiotics were started.  Spoke with patient's wife and at baseline he has speech difficulties and has severe dementia.  We will change to inpatient as patient will need greater than 2 midnights as he is still tachycardic, wheezing, hypoxic despite treatment that was given in the ER.  Patient also had some incidental findings on CT scan of chest: 1. Posttreatment changes in the right upper lobe from prior right upper lobectomy. 12 x 11 mm nodular soft tissue in the right lung apex adjacent to the surgical suture line which may reflect postsurgical changes versus recurrent malignancy. Recommend comparison with any prior imaging which may be available. 2.  Ascending aortic aneurysm measuring 4.5 cm in diameter. Ascending thoracic aortic aneurysm. Recommend semi-annual imaging followup by CTA or MRA and referral to cardiothoracic surgery if not already obtained.   Eulogio Bear DO

## 2019-12-27 NOTE — ED Notes (Signed)
MD at bedside. 

## 2019-12-27 NOTE — ED Notes (Signed)
Patient still adamantly refusing rectal temp

## 2019-12-27 NOTE — ED Notes (Addendum)
Informed PA [place] covid test neg

## 2019-12-27 NOTE — ED Notes (Signed)
Patient given meal and fluids per request.  Patient and daughter in room talking.  NAD.  Patient wishes to take his gabapentin with his other morning meds.  Pharmacy notified

## 2019-12-27 NOTE — ED Notes (Signed)
Breakfast ordered 

## 2019-12-27 NOTE — H&P (Signed)
History and Physical    James [name] IZT:[id number] DOB: Sep 01, 1951 DOA: 12/26/2019  PCP: Ann Maki, MD  Patient coming from: Home   Chief Complaint:  Chief Complaint  Patient presents with  . Shortness of Breath     HPI:    69 year old male with past medical history of chronic respiratory failure (on 3lpm with exertion only), COPD, dementia, gastroesophageal reflux disease, hyperlipidemia who presents to [facility] with a 1 week history of progressively worsening cough and shortness of breath.  Patient is suffering from dementia and therefore is an extremely poor historian.  17 of history is obtained from the wife via telephone conversation.  Wife explains that approximately 1 week ago the patient developed shortness of breath.  The shortness of breath progressively worsened over the past several days and has been associated with worsening cough beyond his baseline cough.  Cough is productive with white to clear sputum.  Patient has not exhibited any associated chest pain or fevers over the span of time.  There have been no sick contacts or confirmed contact with COVID-19.  There has been no recent travel.  According to the wife, patient underwent [employer] COVID-19 vaccination on 3/12.  Patient symptoms continue to worsen until he eventually presented to [facility] emergency department brought in by EMS for evaluation.   On evaluation in the emergency department, clinically patient was felt to be suffering from a COPD exacerbation.  Furthermore the patient was noted to be hypoxic with pulse oximetry being 88% on room air.  Patient was provided with 125 mg of intravenous Solu-Medrol via EMS in route to the hospital.  Chest x-ray was performed revealing questionable infiltrate of the left lower lobe and therefore ceftriaxone and azithromycin were also initiated.  Hospitalist group was then called to assist the patient for mission of the  hospital.     Review of Systems: Unable to perform review of systems due to advanced dementia.    Past Medical History:  Diagnosis Date  . Aorta aneurysm (HCC)    4.2 ascending thoracic aortic aneursym without evidence of abdominal aortic aneurysm on CTA 11/07/12  . Aortic aneurysm ([place])    "unrepaired; has it checked q yr" (05/12/2013)  . BPH (benign prostatic hypertrophy)   . Cancer of lung [facility])    s/p resection in 2005 at [facility] / [place] patient  . Chronic back pain started in 1990's  . Chronic lower back pain   . Colitis   . COPD (chronic obstructive pulmonary disease) ([place])   . Exertional shortness of breath   . Exposure to Agent Orange    [place] War  . GERD (gastroesophageal reflux disease)   . High cholesterol   . Hypertension   . Liver cancer ([place]) 2017   treated at [facility]  . LUNG CANCER, HX OF 11/01/2009   Qualifier: Diagnosis of  By: Ronnald Ramp MD, Arvid Right.   . OSA on CPAP   . PAD (peripheral artery disease) ([place])     Past Surgical History:  Procedure Laterality Date  . COLON SURGERY    . COLOSTOMY  11/08/2012  . COLOSTOMY CLOSURE N/A 05/12/2013   Procedure: Exploratory Laparotomy, Lysis of adhesions, Colon resection, Proctoscope, COLOSTOMY CLOSURE;  Surgeon: Joyice Faster. Cornett, MD;  Location: [place];  Service: General;  Laterality: N/A;  yellow fin stirrups   . COLOSTOMY REVERSAL  05/12/2013  . FEMORAL ARTERY STENT Right ?2002  . LAPAROTOMY N/A 11/08/2012   Procedure: EXPLORATORY LAPAROTOMY with  descending and sigmoid colectomy; wound vac placement and colostomy;  Surgeon: Joyice Faster. Cornett, MD;  Location: [place];  Service: General;  Laterality: N/A;  . LUNG LOBECTOMY Left 2005   "upper lobe; bronchial tube replaced w/piece of lining of his rib" (05/12/2013)     reports that he quit smoking about 16 years ago. His smoking use included cigarettes. He has a 30.00 pack-year smoking history. His smokeless tobacco use includes chew. He reports that he does not drink alcohol or use  drugs.  Allergies  Allergen Reactions  . Amlodipine Besy-Benazepril Hcl     REACTION: sob  . Meloxicam     REACTION: stomach upset  . Penicillins     REACTION: Rash Has patient had a PCN reaction causing immediate rash, facial/tongue/throat swelling, SOB or lightheadedness with hypotension: No Has patient had a PCN reaction causing severe rash involving mucus membranes or skin necrosis: No Has patient had a PCN reaction that required hospitalization: No Has patient had a PCN reaction occurring within the last 10 years: No If all of the above answers are "NO", then may proceed with Cephalosporin use.    Family History  Problem Relation Age of Onset  . Heart attack Mother   . Tuberculosis Mother   . Heart attack Father   . Stroke Father   . Heart attack Brother   . Heart disease Sister   . Cancer Brother        Prostate     Prior to Admission medications   Medication Sig Start Date End Date Taking? Authorizing Provider  albuterol (PROVENTIL HFA;VENTOLIN HFA) 108 (90 BASE) MCG/ACT inhaler Inhale 2 puffs into the lungs every 6 (six) hours as needed for wheezing or shortness of breath. For shortness of breath    [provider]  aspirin 81 MG tablet Take 81 mg by mouth daily.    [provider]  atorvastatin (LIPITOR) 40 MG tablet Take 20 mg by mouth daily.    [provider]  budesonide-formoterol (SYMBICORT) 160-4.5 MCG/ACT inhaler Inhale 2 puffs into the lungs 2 (two) times daily.    [provider]  dabigatran (PRADAXA) 150 MG CAPS capsule Take 150 mg by mouth 2 (two) times daily. At 10:00 am 2200 pm    [provider]  diltiazem (TIAZAC) 240 MG 24 hr capsule Take 240 mg by mouth 2 (two) times daily.    [provider]  doxycycline (VIBRAMYCIN) 100 MG capsule Take 1 capsule (100 mg total) by mouth 2 (two) times daily. 07/17/18   Hayden Rasmussen, MD  finasteride (PROSCAR) 5 MG tablet Take 5 mg by mouth daily.    [provider]  fluticasone (FLONASE) 50 MCG/ACT nasal spray Place 2 sprays into both nostrils 2 (two) times daily.     [provider]  gabapentin (NEURONTIN) 300 MG capsule Take 300-600 mg by mouth 2 (two) times daily. 300 mg in morning and 600 mg in the evening    [provider]  guaiFENesin (MUCINEX) 600 MG 12 hr tablet Take 600 mg by mouth daily.    [provider]  HYDROcodone-acetaminophen (NORCO) 10-325 MG per tablet Take 1 tablet by mouth 4 (four) times daily.     [provider]  levofloxacin (LEVAQUIN) 750 MG tablet Take 1 tablet (750 mg total) by mouth daily. Patient not taking: Reported on 07/17/2018 05/19/13   Erroll Luna, MD  lisinopril-hydrochlorothiazide (PRINZIDE,ZESTORETIC) 20-25 MG tablet Take 1 tablet by mouth daily.    [provider]  loratadine (CLARITIN) 10 MG tablet Take 10 mg by mouth daily.    [provider]  omeprazole (PRILOSEC OTC) 20 MG tablet Take 20 mg by mouth 2 (two) times daily as needed (Acid reflux).     [provider]  omeprazole (PRILOSEC) 20 MG capsule Take 20 mg by mouth daily.    [provider]  ondansetron (ZOFRAN ODT) 4 MG disintegrating tablet Take 1 tablet (4 mg total) by mouth every 6 (six) hours as needed for nausea or vomiting. Patient not taking: Reported on 07/17/2018 01/30/16   Julianne Rice, MD  OVER THE COUNTER MEDICATION Take 1 tablet by mouth daily. CPAP    [provider]  oxyCODONE-acetaminophen (ROXICET) 5-325 MG per tablet Take 1 tablet by mouth every 4 (four) hours as needed for pain. Patient not taking: Reported on 07/17/2018 05/19/13   Erroll Luna, MD  OXYGEN Inhale 3 L into the lungs as needed (shortness of breath).    [provider]  predniSONE (DELTASONE) 20 MG tablet Take 3 tablets (60 mg total) by mouth daily. 07/17/18   Hayden Rasmussen, MD  senna-docusate (SENOKOT-S) 8.6-50 MG tablet Take 2 tablets by mouth daily at 12 noon.     [provider]  tiotropium (SPIRIVA) 18 MCG inhalation capsule Place 18 mcg into inhaler and inhale daily.    [provider]    Physical Exam: Vitals:   12/27/19 0130 12/27/19 0215 12/27/19 0230 12/27/19 0300  BP: 127/75  132/76 (!) 143/76  Pulse: 89 94 (!) 105 94  Resp: 20 18 20    Temp:      TempSrc:      SpO2: 96% 100% 94% 96%    Constitutional: Awake, alert, oriented x1, not in any acute stress Skin: no rashes, no lesions, poor skin turgor noted. Eyes: Pupils are equally reactive to light.  No evidence of scleral icterus or conjunctival pallor.  ENMT: Slightly dry mucous membranes. Posterior pharynx clear of any exudate or lesions. Normal dentition.   Neck: normal, supple, no masses, no thyromegaly Respiratory: Bibasilar rales, worst in the left base with notable expiratory wheezing and prolonged aspiratory phase.  Normal respiratory effort. No accessory muscle use.  Cardiovascular: Irregularly irregular rate and rhythm.  No extremity edema. 2+ pedal pulses. No carotid bruits.  Back:   Nontender without crepitus or deformity. Abdomen: Abdomen is soft and nontender.  No evidence of intra-abdominal masses.  Positive bowel sounds noted in all quadrants.   Musculoskeletal: No joint deformity upper and lower extremities. Good ROM, no contractures. Normal muscle tone.  Neurologic: CN 2-12 grossly intact. Sensation intact, patient is moving all 4 extremities spontaneously.  Patient is intermittently following commands.  Patient is responsive to verbal stimuli.   Psychiatric: Patient exhibits angry mood with labile affect.  Patient currently does not seem to possess insight as to his current situation.     Labs on Admission: I have personally reviewed following labs and imaging studies -   CBC: Recent Labs  Lab 12/26/19 2343  WBC 11.5*  HGB 13.5  HCT 40.7  MCV 91.3  PLT 655   Basic Metabolic Panel: Recent Labs  Lab 12/26/19 2343  NA 133*  K 3.5  CL 92*   CO2 27  GLUCOSE 118*  BUN 20  CREATININE 1.26*  CALCIUM 8.9   GFR: CrCl cannot be calculated (Unknown ideal weight.). Liver Function Tests: No results for input(s): AST, ALT, ALKPHOS, BILITOT, PROT, ALBUMIN in the last 168 hours. No results for  input(s): LIPASE, AMYLASE in the last 168 hours. No results for input(s): AMMONIA in the last 168 hours. Coagulation Profile: No results for input(s): INR, PROTIME in the last 168 hours. Cardiac Enzymes: No results for input(s): CKTOTAL, CKMB, CKMBINDEX, TROPONINI in the last 168 hours. BNP (last 3 results) No results for input(s): PROBNP in the last 8760 hours. HbA1C: No results for input(s): HGBA1C in the last 72 hours. CBG: No results for input(s): GLUCAP in the last 168 hours. Lipid Profile: No results for input(s): CHOL, HDL, LDLCALC, TRIG, CHOLHDL, LDLDIRECT in the last 72 hours. Thyroid Function Tests: No results for input(s): TSH, T4TOTAL, FREET4, T3FREE, THYROIDAB in the last 72 hours. Anemia Panel: No results for input(s): VITAMINB12, FOLATE, FERRITIN, TIBC, IRON, RETICCTPCT in the last 72 hours. Urine analysis:    Component Value Date/Time   COLORURINE RED (A) 11/12/2012 1529   APPEARANCEUR HAZY (A) 11/12/2012 1529   LABSPEC 1.009 11/12/2012 1529   PHURINE 6.5 11/12/2012 1529   GLUCOSEU NEGATIVE 11/12/2012 1529   HGBUR LARGE (A) 11/12/2012 1529   BILIRUBINUR NEGATIVE 11/12/2012 1529   KETONESUR NEGATIVE 11/12/2012 1529   PROTEINUR NEGATIVE 11/12/2012 1529   UROBILINOGEN 0.2 11/12/2012 1529   NITRITE NEGATIVE 11/12/2012 1529   LEUKOCYTESUR NEGATIVE 11/12/2012 1529    Radiological Exams on Admission:   CXR:  Chest X-ray was personally reviewed.  Patchy left lower lobe infiltrates.  No evidence of pleural effusion.  No evidence of pneumothorax.    EKG: Personally reviewed.  Rhythm is atrial fibrillation with heart rate of 106 bpm.  No dynamic ST segment changes appreciated.  Assessment/Plan Principal Problem:    COPD with acute exacerbation ([place])   Patient is presenting with clinical evidence of COPD exacerbation with ongoing wheezing and prolonged expiratory phase in the setting of hypoxia at rest concerning for developing acute on chronic hypoxic respiratory failure.  Patient's been placed on intravenous systemic steroid therapy  Placing patient on aggressive bronchodilator therapy with DuoNeb's  Provided patient with supplemental oxygen to maintain oxygen saturations of between 89 to 92%.  Due to concerns for early developing left lower lobe infiltrates on chest x-ray, initiating ceftriaxone and azithromycin for now.  Due to known history of lung malignancy, obtaining CT imaging of the chest to ensure there is no evidence of recurrent malignancy.  Obtaining ABG on room air  Of note, patient has a known history of chronic respiratory failure requiring 3 L of oxygen with exertion historically.   Active Problems:   Pneumonia of left lower lobe due to infectious organism  Possible developing left lower infiltrate based on review of chest imaging  Initiating intravenous ceftriaxone and azithromycin  CT imaging of the chest has been ordered due to history of lung malignancy which can also confirm presence of pneumonia.  Provide patient with gentle intravenous hydration with isotonic fluids  Obtaining blood cultures    Paroxysmal atrial fibrillation (HCC)  Continue home regimen of diltiazem  Continue home regimen of Pradaxa  Monitoring patient on telemetry    Hyponatremia with dehydration  Mild hyponatremia noted on chemistry  Clinically, patient appears to be dehydrated  Hyponatremia is likely secondary to volume depletion  During patient with intravenous isotonic fluids  On her sodium levels with serial chemistries  Will expand work-up of hyponatremia if hyponatremia worsens or persists    Dementia with behavioral disturbance (HCC)   Presence of dementia complicates  clinical presentation  Continuing home regimen of Namenda    GERD  Continuing home regimen of daily  PPI therapy.     Code Status:  Full code Family Communication: discussed with patient's wife  Disposition Plan: Patient is anticipated to be discharged to home once patient has met maximum benefit from current hospitalization.   Consults called: None Admission status: Patient will be admitted to Observation and is anticipated to remain in the hospital for less than 2 midnights.   Vernelle Emerald MD [facility] Pager [phone]  If 7PM-7AM, please contact night-coverage [email or web address] Use universal [place] password for that web site. If you do not have the password, please call the hospital operator.  12/27/2019, 3:34 AM

## 2019-12-27 NOTE — ED Notes (Signed)
Patient wanted to ambulate to bathroom.  Patient ambulated without difficulty.  Upon returning to roon, HR increased to 118 and oxygen saturation decreased to 86% without the oxygen.  Oxygen and monitor replaced and patient educated on safety of staying in bed.

## 2019-12-27 NOTE — ED Notes (Signed)
This RN in to draw ordered labs.  Patient refused, does not want to get stuck again at this time.  RT just completed 3 attempts to obtain ABG.  Will reassess at later time

## 2019-12-28 LAB — COMPREHENSIVE METABOLIC PANEL
ALT: 15 U/L (ref 0–44)
AST: 24 U/L (ref 15–41)
Albumin: 3.4 g/dL — ABNORMAL LOW (ref 3.5–5.0)
Alkaline Phosphatase: 83 U/L (ref 38–126)
Anion gap: 13 (ref 5–15)
BUN: 11 mg/dL (ref 8–23)
CO2: 24 mmol/L (ref 22–32)
Calcium: 8.8 mg/dL — ABNORMAL LOW (ref 8.9–10.3)
Chloride: 100 mmol/L (ref 98–111)
Creatinine, Ser: 1.1 mg/dL (ref 0.61–1.24)
GFR calc Af Amer: >60 mL/min (ref >60)
GFR calc non Af Amer: >60 mL/min (ref >60)
Glucose, Bld: 142 mg/dL — ABNORMAL HIGH (ref 70–99)
Potassium: 4.4 mmol/L (ref 3.5–5.1)
Sodium: 137 mmol/L (ref 135–145)
Total Bilirubin: 0.8 mg/dL (ref 0.3–1.2)
Total Protein: 6.5 g/dL (ref 6.5–8.1)

## 2019-12-28 LAB — CBC WITH DIFFERENTIAL/PLATELET
Abs Immature Granulocytes: 0.03 10*3/uL (ref 0.00–0.07)
Basophils Absolute: 0 10*3/uL (ref 0.0–0.1)
Basophils Relative: 0 %
Eosinophils Absolute: 0 10*3/uL (ref 0.0–0.5)
Eosinophils Relative: 0 %
HCT: 38.6 % — ABNORMAL LOW (ref 39.0–52.0)
Hemoglobin: 12.8 g/dL — ABNORMAL LOW (ref 13.0–17.0)
Immature Granulocytes: 0 %
Lymphocytes Relative: 10 %
Lymphs Abs: 1.3 10*3/uL (ref 0.7–4.0)
MCH: 30.7 pg (ref 26.0–34.0)
MCHC: 33.2 g/dL (ref 30.0–36.0)
MCV: 92.6 fL (ref 80.0–100.0)
Monocytes Absolute: 0.4 10*3/uL (ref 0.1–1.0)
Monocytes Relative: 3 %
Neutro Abs: 11.6 10*3/uL — ABNORMAL HIGH (ref 1.7–7.7)
Neutrophils Relative %: 87 %
Platelets: 261 10*3/uL (ref 150–400)
RBC: 4.17 MIL/uL — ABNORMAL LOW (ref 4.22–5.81)
RDW: 14.2 % (ref 11.5–15.5)
WBC: 13.3 10*3/uL — ABNORMAL HIGH (ref 4.0–10.5)
nRBC: 0 % (ref 0.0–0.2)

## 2019-12-28 LAB — MAGNESIUM: Magnesium: 1.7 mg/dL (ref 1.7–2.4)

## 2019-12-28 NOTE — Discharge Instructions (Signed)

## 2019-12-28 NOTE — Progress Notes (Addendum)
Progress Note    James [name]  KVQ:[id number] DOB: 11/26/1950  DOA: 12/26/2019 PCP: Ann Maki, MD    Brief Narrative:     Medical records reviewed and are as summarized below:  James [name] is an 69 y.o. male with past medical history of chronic respiratory failure (on 3lpm with exertion only), COPD, dementia, gastroesophageal reflux disease, hyperlipidemia who presents to [facility] with a 1 week history of progressively worsening cough and shortness of breath.  Patient is suffering from dementia and therefore is an extremely poor historian.  55 of history is obtained from the wife via telephone conversation.  Wife explains that approximately 1 week ago the patient developed shortness of breath.  The shortness of breath progressively worsened over the past several days and has been associated with worsening cough beyond his baseline cough.    Assessment/Plan:   Principal Problem:   COPD with acute exacerbation (HCC) Active Problems:   GERD   Paroxysmal atrial fibrillation (HCC)   Hyponatremia   Pneumonia of left lower lobe due to infectious organism   Dementia with behavioral disturbance (HCC)   COPD exacerbation (HCC)   acute respiratory failure due to a COPD exacerbation Per wife patient has oxygen at home but does not wear continuously.  He takes on and off as he feels like it  Acute COPD exacerbation IV steroids IV antibiotics for possible left lower lobe pneumonia Pulmonary toilet Nebs  Dementia -Patient appears to be is baseline  Paroxysmal atrial fibrillation Pradaxa Diltiazem  Hyponatremia -Resolved with fluids  Patient also had some incidental findings on CT scan of chest: 1. Posttreatment changes in the right upper lobe from prior right upper lobectomy. 12 x 11 mm nodular soft tissue in the right lung apex adjacent to the surgical suture line which may reflect postsurgical changes versus recurrent malignancy.  Recommend comparison with any prior imaging which may be available. 2.  Ascending aortic aneurysm measuring 4.5 cm in diameter. Ascending thoracic aortic aneurysm. Recommend semi-annual imaging followup by CTA or MRA and referral to cardiothoracic surgery if not already obtained.  Family Communication/Anticipated D/C date and plan/Code Status   Code Status: Full Code.  Family Communication: LM for wife Disposition Plan: Suspect patient still needs another day of IV steroids as he is still tachycardic and appears dyspneic at rest   Medical Consultants:    None.     Subjective:   Patient asking for his wife  Objective:    Vitals:   12/27/19 1909 12/28/19 0651 12/28/19 0721 12/28/19 0741  BP:  (!) 148/62  115/60  Pulse:  100  (!) 115  Resp:  20  20  Temp:  98.8 F (37.1 C)  98.1 F (36.7 C)  TempSrc:    Oral  SpO2: 95% 96% 92% 94%  Weight:      Height:        Intake/Output Summary (Last 24 hours) at 12/28/2019 1213 Last data filed at 12/27/2019 2123 Gross per 24 hour  Intake 1000 ml  Output 625 ml  Net 375 ml   Filed Weights   12/27/19 1855  Weight: 87.7 kg    Exam: In bed, appears dyspneic at rest Diminished breath sounds, no wheezing, patient With a wet sounding cough confused but not combative Positive bowel sounds, soft No lower extremity edema  Data Reviewed:   I have personally reviewed following labs and imaging studies:  Labs: Labs show the following:   Basic Metabolic Panel: Recent Labs  Lab 12/26/19 2343 12/26/19 2343 12/27/19 0520 12/28/19 0125  NA 133*  --  134* 137  K 3.5   < > 3.4* 4.4  CL 92*  --   --  100  CO2 27  --   --  24  GLUCOSE 118*  --   --  142*  BUN 20  --   --  11  CREATININE 1.26*  --   --  1.10  CALCIUM 8.9  --   --  8.8*  MG  --   --   --  1.7   < > = values in this interval not displayed.   GFR Estimated Creatinine Clearance: 74.7 mL/min (by C-G formula based on SCr of 1.1 mg/dL). Liver Function  Tests: Recent Labs  Lab 12/28/19 0125  AST 24  ALT 15  ALKPHOS 83  BILITOT 0.8  PROT 6.5  ALBUMIN 3.4*   No results for input(s): LIPASE, AMYLASE in the last 168 hours. No results for input(s): AMMONIA in the last 168 hours. Coagulation profile No results for input(s): INR, PROTIME in the last 168 hours.  CBC: Recent Labs  Lab 12/26/19 2343 12/27/19 0520 12/28/19 0125  WBC 11.5*  --  13.3*  NEUTROABS  --   --  11.6*  HGB 13.5 12.9* 12.8*  HCT 40.7 38.0* 38.6*  MCV 91.3  --  92.6  PLT 265  --  261   Cardiac Enzymes: No results for input(s): CKTOTAL, CKMB, CKMBINDEX, TROPONINI in the last 168 hours. BNP (last 3 results) No results for input(s): PROBNP in the last 8760 hours. CBG: No results for input(s): GLUCAP in the last 168 hours. D-Dimer: No results for input(s): DDIMER in the last 72 hours. Hgb A1c: No results for input(s): HGBA1C in the last 72 hours. Lipid Profile: No results for input(s): CHOL, HDL, LDLCALC, TRIG, CHOLHDL, LDLDIRECT in the last 72 hours. Thyroid function studies: No results for input(s): TSH, T4TOTAL, T3FREE, THYROIDAB in the last 72 hours.  Invalid input(s): FREET3 Anemia work up: No results for input(s): VITAMINB12, FOLATE, FERRITIN, TIBC, IRON, RETICCTPCT in the last 72 hours. Sepsis Labs: Recent Labs  Lab 12/26/19 2343 12/28/19 0125  WBC 11.5* 13.3*    Microbiology Recent Results (from the past 240 hour(s))  SARS CORONAVIRUS 2 (TAT 6-24 HRS) Nasopharyngeal Nasopharyngeal Swab     Status: None   Collection Time: 12/27/19  2:58 AM   Specimen: Nasopharyngeal Swab  Result Value Ref Range Status   SARS Coronavirus 2 NEGATIVE NEGATIVE Final    Comment: (NOTE) SARS-CoV-2 target nucleic acids are NOT DETECTED. The SARS-CoV-2 RNA is generally detectable in upper and lower respiratory specimens during the acute phase of infection. Negative results do not preclude SARS-CoV-2 infection, do not rule out co-infections with other  pathogens, and should not be used as the sole basis for treatment or other patient management decisions. Negative results must be combined with clinical observations, patient history, and epidemiological information. The expected result is Negative. Fact Sheet for Patients: SugarRoll.be Fact Sheet for Healthcare Providers: https://www.woods-mathews.com/ This test is not yet approved or cleared by the [place] FDA and  has been authorized for detection and/or diagnosis of SARS-CoV-2 by FDA under an Emergency Use Authorization (EUA). This EUA will remain  in effect (meaning this test can be used) for the duration of the COVID-19 declaration under Section 56 4(b)(1) of the Act, 21 U.S.C. section 360bbb-3(b)(1), unless the authorization is terminated or revoked sooner. Performed at [facility], [place]  [address], [place], [place] [address]   Culture, blood (routine x 2)     Status: None (Preliminary result)   Collection Time: 12/27/19  4:22 AM   Specimen: BLOOD  Result Value Ref Range Status   Specimen Description BLOOD RIGHT ARM  Final   Special Requests   Final    BOTTLES DRAWN AEROBIC AND ANAEROBIC Blood Culture adequate volume Performed at [facility], [place] [address] [place], [place] [address]    Culture NO GROWTH 1 DAY  Final   Report Status PENDING  Incomplete  Culture, blood (routine x 2)     Status: None (Preliminary result)   Collection Time: 12/27/19  4:22 AM   Specimen: BLOOD  Result Value Ref Range Status   Specimen Description BLOOD RIGHT HAND  Final   Special Requests   Final    BOTTLES DRAWN AEROBIC ONLY Blood Culture adequate volume Performed at [facility], [place] [address] [place], [place] [address]    Culture NO GROWTH 1 DAY  Final   Report Status PENDING  Incomplete    Procedures and diagnostic studies:  CT CHEST WO CONTRAST  Result Date: 12/27/2019 CLINICAL DATA:  Pneumonia. History of lung  malignancy. EXAM: CT CHEST WITHOUT CONTRAST TECHNIQUE: Multidetector CT imaging of the chest was performed following the standard protocol without IV contrast. COMPARISON:  None. FINDINGS: Cardiovascular: No significant vascular findings. Normal heart size. No pericardial effusion. Thoracic aortic atherosclerosis. Ascending thoracic aortic aneurysm measuring 4.5 cm in diameter at the level of main pulmonary artery. Coronary artery atherosclerosis. Mediastinum/Nodes: No enlarged mediastinal or axillary lymph nodes. Thyroid gland, trachea, and esophagus demonstrate no significant findings. Lungs/Pleura: Bibasilar scarring. Posttreatment changes in the right upper lobe from prior right upper lobectomy. 12 x 11 mm nodular soft tissue in the right lung apex adjacent to the surgical suture line which may reflect postsurgical changes versus recurrent malignancy. Stable right posterior pleural thickening unchanged compared with 01/30/2016. no focal consolidation, pleural effusion or pneumothorax. Emphysematous changes bilaterally. Upper Abdomen: No acute abnormality. Musculoskeletal: No chest wall mass or suspicious bone lesions identified. IMPRESSION: 1. No acute cardiopulmonary disease. 2. Posttreatment changes in the right upper lobe from prior right upper lobectomy. 12 x 11 mm nodular soft tissue in the right lung apex adjacent to the surgical suture line which may reflect postsurgical changes versus recurrent malignancy. Recommend comparison with any prior imaging which may be available. 3.  Aortic Atherosclerosis (ICD10-I70.0). Emphysema (ICD10-J43.9). 4. Ascending aortic aneurysm measuring 4.5 cm in diameter. Ascending thoracic aortic aneurysm. Recommend semi-annual imaging followup by CTA or MRA and referral to cardiothoracic surgery if not already obtained. This recommendation follows 2010 ACCF/AHA/AATS/ACR/ASA/SCA/SCAI/SIR/STS/SVM Guidelines for the Diagnosis and Management of Patients With Thoracic Aortic  Disease. Circulation. 2010; 121: B147-W295. Aortic aneurysm NOS (ICD10-I71.9) Electronically Signed   By: Kathreen Devoid   On: 12/27/2019 04:24   DG Chest Port 1 View  Result Date: 12/27/2019 CLINICAL DATA:  Cough and fever. Wheezing. EXAM: PORTABLE CHEST 1 VIEW COMPARISON:  Radiograph 07/07/2018 FINDINGS: Lungs are hyperinflated with emphysema. Postsurgical change in the right hemithorax with surgical clips and sutures at the hilum mid and upper lung zone. Heart is normal in size with normal mediastinal contours. Streaky opacities at the left lung base most consistent with atelectasis. No confluent airspace disease. Mild biapical pleuroparenchymal scarring. No pneumothorax or large pleural effusion. No acute osseous abnormalities are seen. IMPRESSION: 1. Streaky left lung base opacities most consistent with atelectasis. 2. Emphysema with postsurgical change in  the right hemithorax. Electronically Signed   By: Keith Rake M.D.   On: 12/27/2019 00:55    Medications:   . atorvastatin  20 mg Oral Daily  . dabigatran  150 mg Oral BID  . diltiazem  240 mg Oral Daily  . finasteride  5 mg Oral Daily  . gabapentin  300 mg Oral q AM  . gabapentin  600 mg Oral QHS  . ipratropium-albuterol  3 mL Nebulization TID  . lisinopril  20 mg Oral Daily  . memantine  10 mg Oral BID  . methylPREDNISolone (SOLU-MEDROL) injection  60 mg Intravenous Q12H  . pantoprazole  40 mg Oral Daily   Continuous Infusions: . azithromycin 500 mg (12/28/19 0335)  . cefTRIAXone (ROCEPHIN)  IV 1 g (12/28/19 0301)     LOS: 1 day   Geradine Girt  [facility]   How to contact the [facility] Attending or Consulting provider [place] or covering provider during after hours [place], for this patient?  1. Check the care team in [facility] and look for a) attending/consulting [facility] provider listed and b) the [facility] team listed 2. Log into [email or web address] and use [place]'s universal password to access. If you do not have the password,  please contact the hospital operator. 3. Locate the [facility] provider you are looking for under [facility] and page to a number that you can be directly reached. 4. If you still have difficulty reaching the provider, please page the [facility] (Director on Call) for the Hospitalists listed on amion for assistance.  12/28/2019, 12:13 PM

## 2019-12-29 MED ORDER — PREDNISONE 10 MG PO TABS: ORAL_TABLET | ORAL | 0 refills | Status: DC

## 2019-12-29 MED ORDER — AZITHROMYCIN 250 MG PO TABS: 500.0000 mg | ORAL_TABLET | Freq: Every day | ORAL | Status: DC

## 2019-12-29 NOTE — Discharge Summary (Signed)
Physician Discharge Summary  James [name] KVQ:[id number] DOB: December 31, 1950 DOA: 12/26/2019  PCP: Ann Maki, MD  Admit date: 12/26/2019 Discharge date: 12/29/2019  Admitted From: Home Disposition: Home  Recommendations for Outpatient Follow-up:  1. Follow up with PCP in 1-2 weeks 2. Please obtain BMP/CBC in one week  Discharge Condition: Stable CODE STATUS: Full Diet recommendation: As tolerated  Brief/Interim Summary: James [name] is an 69 y.o. male with past medical history of chronic respiratory failure(on 3lpm with exertion only),COPD, dementia, gastroesophageal reflux disease, hyperlipidemia who presents to [facility] with a 1 week history of progressively worsening cough and shortness of breath. Patient is suffering from dementia and therefore is an extremely poor historian. 80 of history is obtained from the wife via telephone conversation. Wife explains that approximately 1 week ago the patient developed shortness of breath. The shortness of breath progressively worsened over the past several days and has been associated with worsening cough beyond his baseline cough.   Patient met as above with acute on chronic hypoxic respiratory failure in the setting of COPD exacerbation.  Improved drastically on nebs, steroids, supportive care, there is some concern about patient's oxygen settings at home as he wears it "as needed" with no real guidance in the outpatient setting on how many liters.  While in the hospital here he was able to maintain sats on room air at rest however he did require 2 L nasal cannula with exertion.  Given patient's marked improvement over the past 24 hours no longer requiring oxygen at rest and appears to be on less oxygen with ambulation and he was previously he is certainly stable and reasonable for discharge.  Patient will need close follow-up with PCP and pulmonology in the next 1 to 2 weeks as scheduled.  No medication changes during  hospitalization other than continued prednisone taper at discharge.  Discharge Diagnoses:  Principal Problem:   COPD with acute exacerbation ([place]) Active Problems:   GERD   Paroxysmal atrial fibrillation (HCC)   Hyponatremia   Pneumonia of left lower lobe due to infectious organism   Dementia with behavioral disturbance (HCC)   COPD exacerbation [facility])    Discharge Instructions  Discharge Instructions    Call MD for:  difficulty breathing, headache or visual disturbances   Complete by: As directed    Call MD for:  extreme fatigue   Complete by: As directed    Call MD for:  hives   Complete by: As directed    Call MD for:  persistant dizziness or light-headedness   Complete by: As directed    Call MD for:  persistant nausea and vomiting   Complete by: As directed    Call MD for:  severe uncontrolled pain   Complete by: As directed    Call MD for:  temperature >100.4   Complete by: As directed    Diet - low sodium heart healthy   Complete by: As directed    Increase activity slowly   Complete by: As directed      Allergies as of 12/29/2019      Reactions   Amlodipine Besy-benazepril Hcl    REACTION: sob, patient can take lisinopril/HCTz, diltiazem (CCB) now   Meloxicam    REACTION: stomach upset   Penicillins    REACTION: Rash Has patient had a PCN reaction causing immediate rash, facial/tongue/throat swelling, SOB or lightheadedness with hypotension: No Has patient had a PCN reaction causing severe rash involving mucus membranes or skin necrosis: No  Has patient had a PCN reaction that required hospitalization: No Has patient had a PCN reaction occurring within the last 10 years: No If all of the above answers are "NO", then may proceed with Cephalosporin use.      Medication List    STOP taking these medications   levofloxacin 750 MG tablet Commonly known as: Levaquin   OXYGEN     TAKE these medications   albuterol 108 (90 Base) MCG/ACT inhaler Commonly  known as: VENTOLIN HFA Inhale 2 puffs into the lungs every 6 (six) hours as needed for wheezing or shortness of breath. For shortness of breath   atorvastatin 40 MG tablet Commonly known as: LIPITOR Take 20 mg by mouth daily.   budesonide-formoterol 160-4.5 MCG/ACT inhaler Commonly known as: SYMBICORT Inhale 2 puffs into the lungs 2 (two) times daily.   dabigatran 150 MG Caps capsule Commonly known as: PRADAXA Take 150 mg by mouth 2 (two) times daily. At 10:00 am 2200 pm   diltiazem 240 MG 24 hr capsule Commonly known as: TIAZAC Take 240 mg by mouth 2 (two) times daily.   finasteride 5 MG tablet Commonly known as: PROSCAR Take 5 mg by mouth daily.   fluticasone 50 MCG/ACT nasal spray Commonly known as: FLONASE Place 2 sprays into both nostrils 2 (two) times daily as needed for allergies.   gabapentin 300 MG capsule Commonly known as: NEURONTIN Take 300-600 mg by mouth 2 (two) times daily. 300 mg in morning and 600 mg in the evening   guaiFENesin 600 MG 12 hr tablet Commonly known as: MUCINEX Take 600 mg by mouth daily.   lisinopril-hydrochlorothiazide 20-25 MG tablet Commonly known as: ZESTORETIC Take 1 tablet by mouth daily.   loratadine 10 MG tablet Commonly known as: CLARITIN Take 10 mg by mouth daily.   memantine 10 MG tablet Commonly known as: NAMENDA Take 10 mg by mouth 2 (two) times daily.   naproxen sodium 220 MG tablet Commonly known as: ALEVE Take 220 mg by mouth daily as needed (pain).   omeprazole 20 MG tablet Commonly known as: PRILOSEC OTC Take 20 mg by mouth 2 (two) times daily as needed (Acid reflux).   ondansetron 4 MG disintegrating tablet Commonly known as: Zofran ODT Take 1 tablet (4 mg total) by mouth every 6 (six) hours as needed for nausea or vomiting.   oxyCODONE-acetaminophen 5-325 MG tablet Commonly known as: Roxicet Take 1 tablet by mouth every 4 (four) hours as needed for pain.   predniSONE 10 MG tablet Commonly known as:  DELTASONE Take 4 tablets (40 mg total) by mouth daily for 3 days, THEN 3 tablets (30 mg total) daily for 3 days, THEN 2 tablets (20 mg total) daily for 3 days, THEN 1 tablet (10 mg total) daily for 3 days. Start taking on: December 29, 2019   tiotropium 18 MCG inhalation capsule Commonly known as: SPIRIVA Place 18 mcg into inhaler and inhale daily.            Durable Medical Equipment  (From admission, onward)         Start     Ordered   12/29/19 1201  DME Oxygen  Once    Question Answer Comment  Length of Need Lifetime   Mode or (Route) Nasal cannula   Liters per Minute 2   Frequency Continuous (stationary and portable oxygen unit needed)   Oxygen delivery system Gas      12/29/19 1201  Allergies  Allergen Reactions  . Amlodipine Besy-Benazepril Hcl     REACTION: sob, patient can take lisinopril/HCTz, diltiazem (CCB) now  . Meloxicam     REACTION: stomach upset  . Penicillins     REACTION: Rash Has patient had a PCN reaction causing immediate rash, facial/tongue/throat swelling, SOB or lightheadedness with hypotension: No Has patient had a PCN reaction causing severe rash involving mucus membranes or skin necrosis: No Has patient had a PCN reaction that required hospitalization: No Has patient had a PCN reaction occurring within the last 10 years: No If all of the above answers are "NO", then may proceed with Cephalosporin use.    Procedures/Studies: CT CHEST WO CONTRAST  Result Date: 12/27/2019 CLINICAL DATA:  Pneumonia. History of lung malignancy. EXAM: CT CHEST WITHOUT CONTRAST TECHNIQUE: Multidetector CT imaging of the chest was performed following the standard protocol without IV contrast. COMPARISON:  None. FINDINGS: Cardiovascular: No significant vascular findings. Normal heart size. No pericardial effusion. Thoracic aortic atherosclerosis. Ascending thoracic aortic aneurysm measuring 4.5 cm in diameter at the level of main pulmonary artery. Coronary  artery atherosclerosis. Mediastinum/Nodes: No enlarged mediastinal or axillary lymph nodes. Thyroid gland, trachea, and esophagus demonstrate no significant findings. Lungs/Pleura: Bibasilar scarring. Posttreatment changes in the right upper lobe from prior right upper lobectomy. 12 x 11 mm nodular soft tissue in the right lung apex adjacent to the surgical suture line which may reflect postsurgical changes versus recurrent malignancy. Stable right posterior pleural thickening unchanged compared with 01/30/2016. no focal consolidation, pleural effusion or pneumothorax. Emphysematous changes bilaterally. Upper Abdomen: No acute abnormality. Musculoskeletal: No chest wall mass or suspicious bone lesions identified. IMPRESSION: 1. No acute cardiopulmonary disease. 2. Posttreatment changes in the right upper lobe from prior right upper lobectomy. 12 x 11 mm nodular soft tissue in the right lung apex adjacent to the surgical suture line which may reflect postsurgical changes versus recurrent malignancy. Recommend comparison with any prior imaging which may be available. 3.  Aortic Atherosclerosis (ICD10-I70.0). Emphysema (ICD10-J43.9). 4. Ascending aortic aneurysm measuring 4.5 cm in diameter. Ascending thoracic aortic aneurysm. Recommend semi-annual imaging followup by CTA or MRA and referral to cardiothoracic surgery if not already obtained. This recommendation follows 2010 ACCF/AHA/AATS/ACR/ASA/SCA/SCAI/SIR/STS/SVM Guidelines for the Diagnosis and Management of Patients With Thoracic Aortic Disease. Circulation. 2010; 121: Y706-C376. Aortic aneurysm NOS (ICD10-I71.9) Electronically Signed   By: Kathreen Devoid   On: 12/27/2019 04:24   DG Chest Port 1 View  Result Date: 12/27/2019 CLINICAL DATA:  Cough and fever. Wheezing. EXAM: PORTABLE CHEST 1 VIEW COMPARISON:  Radiograph 07/07/2018 FINDINGS: Lungs are hyperinflated with emphysema. Postsurgical change in the right hemithorax with surgical clips and sutures at the  hilum mid and upper lung zone. Heart is normal in size with normal mediastinal contours. Streaky opacities at the left lung base most consistent with atelectasis. No confluent airspace disease. Mild biapical pleuroparenchymal scarring. No pneumothorax or large pleural effusion. No acute osseous abnormalities are seen. IMPRESSION: 1. Streaky left lung base opacities most consistent with atelectasis. 2. Emphysema with postsurgical change in the right hemithorax. Electronically Signed   By: Keith Rake M.D.   On: 12/27/2019 00:55     Subjective: No acute issues or events overnight, review systems quite limited given patient's baseline mental status and advanced dementia but family at bedside remarks the patient appears quite well, respiratory status improving, patient does decline any nausea, vomiting, diarrhea, constipation, headache, fevers, chills.  Discharge Exam: Vitals:   12/29/19 0700 12/29/19 0742  BP:  Marland Kitchen)  152/80  Pulse:  (!) 109  Resp:  16  Temp:  97.9 F (36.6 C)  SpO2: 95% 91%   Vitals:   12/28/19 1954 12/28/19 2308 12/29/19 0700 12/29/19 0742  BP:  (!) 146/93  (!) 152/80  Pulse:  (!) 107  (!) 109  Resp:    16  Temp:  98.1 F (36.7 C)  97.9 F (36.6 C)  TempSrc:  Oral  Oral  SpO2: 93% 94% 95% 91%  Weight:      Height:        General:  Pleasantly resting in bed, No acute distress. HEENT:  Normocephalic atraumatic.  Sclerae nonicteric, noninjected.  Extraocular movements intact bilaterally. Neck:  Without mass or deformity.  Trachea is midline. Lungs:  Clear to auscultate bilaterally without rhonchi, wheeze, or rales. Heart:  Regular rate and rhythm.  Without murmurs, rubs, or gallops. Abdomen:  Soft, nontender, nondistended.  Without guarding or rebound. Extremities: Without cyanosis, clubbing, edema, or obvious deformity. Vascular:  Dorsalis pedis and posterior tibial pulses palpable bilaterally. Skin:  Warm and dry, no erythema, no ulcerations.   The results of  significant diagnostics from this hospitalization (including imaging, microbiology, ancillary and laboratory) are listed below for reference.     Microbiology: Recent Results (from the past 240 hour(s))  SARS CORONAVIRUS 2 (TAT 6-24 HRS) Nasopharyngeal Nasopharyngeal Swab     Status: None   Collection Time: 12/27/19  2:58 AM   Specimen: Nasopharyngeal Swab  Result Value Ref Range Status   SARS Coronavirus 2 NEGATIVE NEGATIVE Final    Comment: (NOTE) SARS-CoV-2 target nucleic acids are NOT DETECTED. The SARS-CoV-2 RNA is generally detectable in upper and lower respiratory specimens during the acute phase of infection. Negative results do not preclude SARS-CoV-2 infection, do not rule out co-infections with other pathogens, and should not be used as the sole basis for treatment or other patient management decisions. Negative results must be combined with clinical observations, patient history, and epidemiological information. The expected result is Negative. Fact Sheet for Patients: SugarRoll.be Fact Sheet for Healthcare Providers: https://www.woods-mathews.com/ This test is not yet approved or cleared by the [place] FDA and  has been authorized for detection and/or diagnosis of SARS-CoV-2 by FDA under an Emergency Use Authorization (EUA). This EUA will remain  in effect (meaning this test can be used) for the duration of the COVID-19 declaration under Section 56 4(b)(1) of the Act, 21 U.S.C. section 360bbb-3(b)(1), unless the authorization is terminated or revoked sooner. Performed at [facility], [place] [address] [place], [place] [address]   Culture, blood (routine x 2)     Status: None (Preliminary result)   Collection Time: 12/27/19  4:22 AM   Specimen: BLOOD  Result Value Ref Range Status   Specimen Description BLOOD RIGHT ARM  Final   Special Requests   Final    BOTTLES DRAWN AEROBIC AND ANAEROBIC Blood Culture adequate  volume   Culture   Final    NO GROWTH 2 DAYS Performed at [facility], [place] [address] [place], [place] [address]    Report Status PENDING  Incomplete  Culture, blood (routine x 2)     Status: None (Preliminary result)   Collection Time: 12/27/19  4:22 AM   Specimen: BLOOD  Result Value Ref Range Status   Specimen Description BLOOD RIGHT HAND  Final   Special Requests   Final    BOTTLES DRAWN AEROBIC ONLY Blood Culture adequate volume   Culture   Final  NO GROWTH 2 DAYS Performed at [facility], [place] [address], [place], [place] [address]    Report Status PENDING  Incomplete     Labs: BNP (last 3 results) No results for input(s): BNP in the last 8760 hours. Basic Metabolic Panel: Recent Labs  Lab 12/26/19 2343 12/27/19 0520 12/28/19 0125  NA 133* 134* 137  K 3.5 3.4* 4.4  CL 92*  --  100  CO2 27  --  24  GLUCOSE 118*  --  142*  BUN 20  --  11  CREATININE 1.26*  --  1.10  CALCIUM 8.9  --  8.8*  MG  --   --  1.7   Liver Function Tests: Recent Labs  Lab 12/28/19 0125  AST 24  ALT 15  ALKPHOS 83  BILITOT 0.8  PROT 6.5  ALBUMIN 3.4*   No results for input(s): LIPASE, AMYLASE in the last 168 hours. No results for input(s): AMMONIA in the last 168 hours. CBC: Recent Labs  Lab 12/26/19 2343 12/27/19 0520 12/28/19 0125  WBC 11.5*  --  13.3*  NEUTROABS  --   --  11.6*  HGB 13.5 12.9* 12.8*  HCT 40.7 38.0* 38.6*  MCV 91.3  --  92.6  PLT 265  --  261   Cardiac Enzymes: No results for input(s): CKTOTAL, CKMB, CKMBINDEX, TROPONINI in the last 168 hours. BNP: Invalid input(s): POCBNP CBG: No results for input(s): GLUCAP in the last 168 hours. D-Dimer No results for input(s): DDIMER in the last 72 hours. Hgb A1c No results for input(s): HGBA1C in the last 72 hours. Lipid Profile No results for input(s): CHOL, HDL, LDLCALC, TRIG, CHOLHDL, LDLDIRECT in the last 72 hours. Thyroid function studies No results for input(s): TSH, T4TOTAL, T3FREE,  THYROIDAB in the last 72 hours.  Invalid input(s): FREET3 Anemia work up No results for input(s): VITAMINB12, FOLATE, FERRITIN, TIBC, IRON, RETICCTPCT in the last 72 hours. Urinalysis    Component Value Date/Time   COLORURINE RED (A) 11/12/2012 1529   APPEARANCEUR HAZY (A) 11/12/2012 1529   LABSPEC 1.009 11/12/2012 1529   PHURINE 6.5 11/12/2012 1529   GLUCOSEU NEGATIVE 11/12/2012 1529   HGBUR LARGE (A) 11/12/2012 1529   BILIRUBINUR NEGATIVE 11/12/2012 1529   KETONESUR NEGATIVE 11/12/2012 1529   PROTEINUR NEGATIVE 11/12/2012 1529   UROBILINOGEN 0.2 11/12/2012 1529   NITRITE NEGATIVE 11/12/2012 1529   LEUKOCYTESUR NEGATIVE 11/12/2012 1529   Sepsis Labs Invalid input(s): PROCALCITONIN,  WBC,  LACTICIDVEN Microbiology Recent Results (from the past 240 hour(s))  SARS CORONAVIRUS 2 (TAT 6-24 HRS) Nasopharyngeal Nasopharyngeal Swab     Status: None   Collection Time: 12/27/19  2:58 AM   Specimen: Nasopharyngeal Swab  Result Value Ref Range Status   SARS Coronavirus 2 NEGATIVE NEGATIVE Final    Comment: (NOTE) SARS-CoV-2 target nucleic acids are NOT DETECTED. The SARS-CoV-2 RNA is generally detectable in upper and lower respiratory specimens during the acute phase of infection. Negative results do not preclude SARS-CoV-2 infection, do not rule out co-infections with other pathogens, and should not be used as the sole basis for treatment or other patient management decisions. Negative results must be combined with clinical observations, patient history, and epidemiological information. The expected result is Negative. Fact Sheet for Patients: SugarRoll.be Fact Sheet for Healthcare Providers: https://www.woods-mathews.com/ This test is not yet approved or cleared by the [place] FDA and  has been authorized for detection and/or diagnosis of SARS-CoV-2 by FDA under an Emergency Use Authorization (EUA). This EUA  will remain  in effect  (meaning this test can be used) for the duration of the COVID-19 declaration under Section 56 4(b)(1) of the Act, 21 U.S.C. section 360bbb-3(b)(1), unless the authorization is terminated or revoked sooner. Performed at [facility], [place] [address] [place], [place] [address]   Culture, blood (routine x 2)     Status: None (Preliminary result)   Collection Time: 12/27/19  4:22 AM   Specimen: BLOOD  Result Value Ref Range Status   Specimen Description BLOOD RIGHT ARM  Final   Special Requests   Final    BOTTLES DRAWN AEROBIC AND ANAEROBIC Blood Culture adequate volume   Culture   Final    NO GROWTH 2 DAYS Performed at [facility], [place] [address] [place], [place] [address]    Report Status PENDING  Incomplete  Culture, blood (routine x 2)     Status: None (Preliminary result)   Collection Time: 12/27/19  4:22 AM   Specimen: BLOOD  Result Value Ref Range Status   Specimen Description BLOOD RIGHT HAND  Final   Special Requests   Final    BOTTLES DRAWN AEROBIC ONLY Blood Culture adequate volume   Culture   Final    NO GROWTH 2 DAYS Performed at [facility], [place] [address], [place], [place] [address]    Report Status PENDING  Incomplete     Time coordinating discharge: Over 30 minutes  SIGNED:   Little Ishikawa, DO [facility] 12/29/2019, 12:01 PM

## 2019-12-29 NOTE — Plan of Care (Signed)

## 2019-12-29 NOTE — Progress Notes (Signed)
SATURATION QUALIFICATIONS: (This note is used to comply with regulatory documentation for home oxygen)  Patient Saturations on Room Air at Rest = 91%  Patient Saturations on Room Air while Ambulating = 84%  Patient Saturations on 2 Liters of oxygen while Ambulating = 95%  Please briefly explain why patient needs home oxygen:  Pt tolerated being off O2 at rest. When pt exerted self w/ 2L of O2 his HR was in the 110s and when on RA when pt exerted self his HR was in the 130s. Pt required rest recovery time after ambulating hallway to half circle then back to room.

## 2019-12-29 NOTE — Progress Notes (Signed)
Patient was stable at discharge. Tammi Klippel RN removed their IV. We reviewed the discharge education. Patient and wife verbalized understanding and had no further questions. Patient left with prescription/s in hand.

## 2019-12-29 NOTE — Plan of Care (Signed)
Care plan goals met. Pt is adequate for discharge.

## 2019-12-31 ENCOUNTER — Inpatient Hospital Stay (HOSPITAL_COMMUNITY)
Admission: EM | Admit: 2019-12-31 | Discharge: 2020-01-05 | DRG: 388 | Disposition: A | Discharge status: Home Health | Payer: Non-veteran care | Attending: Student | Admitting: Student

## 2019-12-31 ENCOUNTER — Emergency Department (HOSPITAL_COMMUNITY): Payer: Non-veteran care

## 2019-12-31 ENCOUNTER — Other Ambulatory Visit: Payer: Self-pay

## 2019-12-31 DIAGNOSIS — J189 Pneumonia, unspecified organism: Secondary | ICD-10-CM | POA: Diagnosis present

## 2019-12-31 DIAGNOSIS — Z9049 Acquired absence of other specified parts of digestive tract: Secondary | ICD-10-CM

## 2019-12-31 DIAGNOSIS — Z888 Allergy status to other drugs, medicaments and biological substances status: Secondary | ICD-10-CM

## 2019-12-31 DIAGNOSIS — R338 Other retention of urine: Secondary | ICD-10-CM | POA: Diagnosis present

## 2019-12-31 DIAGNOSIS — Z9981 Dependence on supplemental oxygen: Secondary | ICD-10-CM

## 2019-12-31 DIAGNOSIS — R5381 Other malaise: Secondary | ICD-10-CM | POA: Diagnosis not present

## 2019-12-31 DIAGNOSIS — E876 Hypokalemia: Secondary | ICD-10-CM | POA: Diagnosis not present

## 2019-12-31 DIAGNOSIS — J440 Chronic obstructive pulmonary disease with acute lower respiratory infection: Secondary | ICD-10-CM | POA: Diagnosis present

## 2019-12-31 DIAGNOSIS — I4891 Unspecified atrial fibrillation: Secondary | ICD-10-CM | POA: Diagnosis not present

## 2019-12-31 DIAGNOSIS — N401 Enlarged prostate with lower urinary tract symptoms: Secondary | ICD-10-CM | POA: Diagnosis present

## 2019-12-31 DIAGNOSIS — I739 Peripheral vascular disease, unspecified: Secondary | ICD-10-CM | POA: Diagnosis present

## 2019-12-31 DIAGNOSIS — Z20822 Contact with and (suspected) exposure to covid-19: Secondary | ICD-10-CM | POA: Diagnosis present

## 2019-12-31 DIAGNOSIS — Z8505 Personal history of malignant neoplasm of liver: Secondary | ICD-10-CM

## 2019-12-31 DIAGNOSIS — M545 Low back pain: Secondary | ICD-10-CM | POA: Diagnosis present

## 2019-12-31 DIAGNOSIS — Z8249 Family history of ischemic heart disease and other diseases of the circulatory system: Secondary | ICD-10-CM

## 2019-12-31 DIAGNOSIS — E785 Hyperlipidemia, unspecified: Secondary | ICD-10-CM | POA: Diagnosis present

## 2019-12-31 DIAGNOSIS — J9611 Chronic respiratory failure with hypoxia: Secondary | ICD-10-CM | POA: Diagnosis present

## 2019-12-31 DIAGNOSIS — I480 Paroxysmal atrial fibrillation: Secondary | ICD-10-CM | POA: Diagnosis present

## 2019-12-31 DIAGNOSIS — K56609 Unspecified intestinal obstruction, unspecified as to partial versus complete obstruction: Secondary | ICD-10-CM

## 2019-12-31 DIAGNOSIS — J90 Pleural effusion, not elsewhere classified: Secondary | ICD-10-CM

## 2019-12-31 DIAGNOSIS — Z66 Do not resuscitate: Secondary | ICD-10-CM | POA: Diagnosis present

## 2019-12-31 DIAGNOSIS — D649 Anemia, unspecified: Secondary | ICD-10-CM | POA: Diagnosis not present

## 2019-12-31 DIAGNOSIS — F17220 Nicotine dependence, chewing tobacco, uncomplicated: Secondary | ICD-10-CM | POA: Diagnosis present

## 2019-12-31 DIAGNOSIS — K565 Intestinal adhesions [bands], unspecified as to partial versus complete obstruction: Secondary | ICD-10-CM | POA: Diagnosis present

## 2019-12-31 DIAGNOSIS — J449 Chronic obstructive pulmonary disease, unspecified: Secondary | ICD-10-CM | POA: Diagnosis not present

## 2019-12-31 DIAGNOSIS — Z902 Acquired absence of lung [part of]: Secondary | ICD-10-CM

## 2019-12-31 DIAGNOSIS — Z880 Allergy status to penicillin: Secondary | ICD-10-CM

## 2019-12-31 DIAGNOSIS — Z574 Occupational exposure to toxic agents in agriculture: Secondary | ICD-10-CM

## 2019-12-31 DIAGNOSIS — Z79899 Other long term (current) drug therapy: Secondary | ICD-10-CM

## 2019-12-31 DIAGNOSIS — E7800 Pure hypercholesterolemia, unspecified: Secondary | ICD-10-CM | POA: Diagnosis present

## 2019-12-31 DIAGNOSIS — I951 Orthostatic hypotension: Secondary | ICD-10-CM | POA: Diagnosis not present

## 2019-12-31 DIAGNOSIS — Z789 Other specified health status: Secondary | ICD-10-CM | POA: Diagnosis not present

## 2019-12-31 DIAGNOSIS — Z933 Colostomy status: Secondary | ICD-10-CM

## 2019-12-31 DIAGNOSIS — F0390 Unspecified dementia without behavioral disturbance: Secondary | ICD-10-CM | POA: Diagnosis present

## 2019-12-31 DIAGNOSIS — G4733 Obstructive sleep apnea (adult) (pediatric): Secondary | ICD-10-CM | POA: Diagnosis present

## 2019-12-31 DIAGNOSIS — G8929 Other chronic pain: Secondary | ICD-10-CM | POA: Diagnosis present

## 2019-12-31 DIAGNOSIS — Z85118 Personal history of other malignant neoplasm of bronchus and lung: Secondary | ICD-10-CM

## 2019-12-31 DIAGNOSIS — I10 Essential (primary) hypertension: Secondary | ICD-10-CM | POA: Diagnosis present

## 2019-12-31 DIAGNOSIS — N400 Enlarged prostate without lower urinary tract symptoms: Secondary | ICD-10-CM | POA: Diagnosis not present

## 2019-12-31 DIAGNOSIS — I714 Abdominal aortic aneurysm, without rupture: Secondary | ICD-10-CM | POA: Diagnosis present

## 2019-12-31 DIAGNOSIS — K219 Gastro-esophageal reflux disease without esophagitis: Secondary | ICD-10-CM | POA: Diagnosis present

## 2019-12-31 DIAGNOSIS — Z7951 Long term (current) use of inhaled steroids: Secondary | ICD-10-CM

## 2019-12-31 DIAGNOSIS — Z0189 Encounter for other specified special examinations: Secondary | ICD-10-CM

## 2019-12-31 LAB — COMPREHENSIVE METABOLIC PANEL
ALT: 17 U/L (ref 0–44)
AST: 21 U/L (ref 15–41)
Albumin: 3.4 g/dL — ABNORMAL LOW (ref 3.5–5.0)
Alkaline Phosphatase: 67 U/L (ref 38–126)
Anion gap: 16 — ABNORMAL HIGH (ref 5–15)
BUN: 33 mg/dL — ABNORMAL HIGH (ref 8–23)
CO2: 33 mmol/L — ABNORMAL HIGH (ref 22–32)
Calcium: 9.5 mg/dL (ref 8.9–10.3)
Chloride: 89 mmol/L — ABNORMAL LOW (ref 98–111)
Creatinine, Ser: 1.14 mg/dL (ref 0.61–1.24)
GFR calc Af Amer: >60 mL/min (ref >60)
GFR calc non Af Amer: >60 mL/min (ref >60)
Glucose, Bld: 136 mg/dL — ABNORMAL HIGH (ref 70–99)
Potassium: 3.5 mmol/L (ref 3.5–5.1)
Sodium: 138 mmol/L (ref 135–145)
Total Bilirubin: 1.2 mg/dL (ref 0.3–1.2)
Total Protein: 6.7 g/dL (ref 6.5–8.1)

## 2019-12-31 LAB — CBC
HCT: 43.4 % (ref 39.0–52.0)
Hemoglobin: 14.2 g/dL (ref 13.0–17.0)
MCH: 30.2 pg (ref 26.0–34.0)
MCHC: 32.7 g/dL (ref 30.0–36.0)
MCV: 92.3 fL (ref 80.0–100.0)
Platelets: 310 10*3/uL (ref 150–400)
RBC: 4.7 MIL/uL (ref 4.22–5.81)
RDW: 13.7 % (ref 11.5–15.5)
WBC: 15.3 10*3/uL — ABNORMAL HIGH (ref 4.0–10.5)
nRBC: 0 % (ref 0.0–0.2)

## 2019-12-31 LAB — MAGNESIUM: Magnesium: 2 mg/dL (ref 1.7–2.4)

## 2019-12-31 LAB — PROTIME-INR
INR: 1.1 (ref 0.8–1.2)
Prothrombin Time: 14.5 seconds (ref 11.4–15.2)

## 2019-12-31 LAB — TYPE AND SCREEN
ABO/RH(D): A POS
Antibody Screen: NEGATIVE

## 2019-12-31 LAB — ABO/RH: ABO/RH(D): A POS

## 2019-12-31 LAB — APTT: aPTT: 40 seconds — ABNORMAL HIGH (ref 24–36)

## 2019-12-31 LAB — POC OCCULT BLOOD, ED: Fecal Occult Bld: NEGATIVE

## 2019-12-31 MED ORDER — UMECLIDINIUM BROMIDE 62.5 MCG/INH IN AEPB
1.0000 | INHALATION_SPRAY | Freq: Every day | RESPIRATORY_TRACT | Status: DC
  Administered 2020-01-01 – 2020-01-05 (×5): 1 via RESPIRATORY_TRACT
  Filled 2019-12-31: qty 7

## 2019-12-31 MED ORDER — FLUTICASONE PROPIONATE 50 MCG/ACT NA SUSP: 2.0000 | Freq: Two times a day (BID) | NASAL | Status: DC | PRN

## 2019-12-31 MED ORDER — ONDANSETRON HCL 4 MG/2ML IJ SOLN
4.0000 mg | Freq: Four times a day (QID) | INTRAMUSCULAR | Status: DC | PRN
  Administered 2019-12-31: 4 mg via INTRAVENOUS
  Filled 2019-12-31: qty 2

## 2019-12-31 MED ORDER — IOHEXOL 300 MG/ML  SOLN
100.0000 mL | Freq: Once | INTRAMUSCULAR | Status: AC | PRN
  Administered 2019-12-31: 100 mL via INTRAVENOUS

## 2019-12-31 MED ORDER — GABAPENTIN 300 MG PO CAPS
600.0000 mg | ORAL_CAPSULE | Freq: Every day | ORAL | Status: DC
  Administered 2019-12-31: 600 mg via ORAL
  Filled 2019-12-31: qty 2

## 2019-12-31 MED ORDER — METHYLPREDNISOLONE SODIUM SUCC 40 MG IJ SOLR
40.0000 mg | Freq: Every day | INTRAMUSCULAR | Status: DC
  Administered 2020-01-01: 40 mg via INTRAVENOUS
  Filled 2019-12-31: qty 1

## 2019-12-31 MED ORDER — POTASSIUM CHLORIDE IN NACL 20-0.9 MEQ/L-% IV SOLN
INTRAVENOUS | Status: DC
  Filled 2019-12-31 (×3): qty 1000

## 2019-12-31 MED ORDER — IPRATROPIUM-ALBUTEROL 0.5-2.5 (3) MG/3ML IN SOLN
3.0000 mL | Freq: Four times a day (QID) | RESPIRATORY_TRACT | Status: DC
  Administered 2019-12-31 – 2020-01-02 (×6): 3 mL via RESPIRATORY_TRACT
  Filled 2019-12-31 (×7): qty 3

## 2019-12-31 MED ORDER — GUAIFENESIN ER 600 MG PO TB12: 600.0000 mg | ORAL_TABLET | Freq: Two times a day (BID) | ORAL | Status: DC

## 2019-12-31 MED ORDER — MAGNESIUM HYDROXIDE 400 MG/5ML PO SUSP
30.0000 mL | Freq: Every day | ORAL | Status: DC | PRN
  Filled 2019-12-31: qty 30

## 2019-12-31 MED ORDER — PANTOPRAZOLE SODIUM 40 MG IV SOLR
40.0000 mg | INTRAVENOUS | Status: DC
  Administered 2019-12-31 – 2020-01-04 (×5): 40 mg via INTRAVENOUS
  Filled 2019-12-31 (×5): qty 40

## 2019-12-31 MED ORDER — DILTIAZEM HCL 25 MG/5ML IV SOLN
5.0000 mg | Freq: Once | INTRAVENOUS | Status: AC
  Administered 2019-12-31: 5 mg via INTRAVENOUS
  Filled 2019-12-31: qty 5

## 2019-12-31 MED ORDER — DILTIAZEM HCL ER COATED BEADS 240 MG PO CP24
240.0000 mg | ORAL_CAPSULE | Freq: Two times a day (BID) | ORAL | Status: DC
  Administered 2019-12-31 – 2020-01-01 (×2): 240 mg via ORAL
  Filled 2019-12-31 (×2): qty 1

## 2019-12-31 MED ORDER — MOMETASONE FURO-FORMOTEROL FUM 200-5 MCG/ACT IN AERO
2.0000 | INHALATION_SPRAY | Freq: Two times a day (BID) | RESPIRATORY_TRACT | Status: DC
  Administered 2020-01-01 – 2020-01-05 (×9): 2 via RESPIRATORY_TRACT
  Filled 2019-12-31: qty 8.8

## 2019-12-31 MED ORDER — MEMANTINE HCL 10 MG PO TABS
10.0000 mg | ORAL_TABLET | Freq: Two times a day (BID) | ORAL | Status: DC
  Administered 2019-12-31 – 2020-01-01 (×2): 10 mg via ORAL
  Filled 2019-12-31 (×2): qty 1

## 2019-12-31 MED ORDER — ACETAMINOPHEN 325 MG PO TABS: 650.0000 mg | ORAL_TABLET | Freq: Four times a day (QID) | ORAL | Status: DC | PRN

## 2019-12-31 MED ORDER — SODIUM CHLORIDE 0.9 % IV SOLN
2.0000 g | INTRAVENOUS | Status: DC
  Administered 2019-12-31 – 2020-01-04 (×5): 2 g via INTRAVENOUS
  Filled 2019-12-31: qty 2
  Filled 2019-12-31: qty 20
  Filled 2019-12-31: qty 2
  Filled 2019-12-31: qty 20
  Filled 2019-12-31 (×2): qty 2

## 2019-12-31 MED ORDER — ATORVASTATIN CALCIUM 40 MG PO TABS
40.0000 mg | ORAL_TABLET | Freq: Every day | ORAL | Status: DC
  Administered 2020-01-01 – 2020-01-05 (×4): 40 mg via ORAL
  Filled 2019-12-31 (×4): qty 1

## 2019-12-31 MED ORDER — ONDANSETRON HCL 4 MG PO TABS: 4.0000 mg | ORAL_TABLET | Freq: Four times a day (QID) | ORAL | Status: DC | PRN

## 2019-12-31 MED ORDER — POTASSIUM CHLORIDE 20 MEQ PO PACK
40.0000 meq | PACK | Freq: Once | ORAL | Status: AC
  Administered 2019-12-31: 40 meq via ORAL
  Filled 2019-12-31: qty 2

## 2019-12-31 MED ORDER — GABAPENTIN 300 MG PO CAPS
300.0000 mg | ORAL_CAPSULE | Freq: Every day | ORAL | Status: DC
  Administered 2020-01-01: 300 mg via ORAL
  Filled 2019-12-31: qty 1

## 2019-12-31 MED ORDER — TRAZODONE HCL 50 MG PO TABS: 25.0000 mg | ORAL_TABLET | Freq: Every evening | ORAL | Status: DC | PRN

## 2019-12-31 MED ORDER — ONDANSETRON HCL 4 MG/2ML IJ SOLN
4.0000 mg | Freq: Once | INTRAMUSCULAR | Status: AC
  Administered 2019-12-31: 4 mg via INTRAVENOUS
  Filled 2019-12-31: qty 2

## 2019-12-31 MED ORDER — ENOXAPARIN SODIUM 40 MG/0.4ML ~~LOC~~ SOLN
40.0000 mg | SUBCUTANEOUS | Status: DC
  Administered 2020-01-01 – 2020-01-05 (×5): 40 mg via SUBCUTANEOUS
  Filled 2019-12-31 (×5): qty 0.4

## 2019-12-31 MED ORDER — LORATADINE 10 MG PO TABS
10.0000 mg | ORAL_TABLET | Freq: Every day | ORAL | Status: DC
  Administered 2020-01-01: 09:00:00 10 mg via ORAL
  Filled 2019-12-31: qty 1

## 2019-12-31 MED ORDER — FINASTERIDE 5 MG PO TABS
5.0000 mg | ORAL_TABLET | Freq: Every day | ORAL | Status: DC
  Administered 2020-01-01: 5 mg via ORAL
  Filled 2019-12-31: qty 1

## 2019-12-31 MED ORDER — ACETAMINOPHEN 650 MG RE SUPP: 650.0000 mg | Freq: Four times a day (QID) | RECTAL | Status: DC | PRN

## 2019-12-31 MED ORDER — GUAIFENESIN ER 600 MG PO TB12
600.0000 mg | ORAL_TABLET | Freq: Two times a day (BID) | ORAL | Status: DC
  Administered 2019-12-31 – 2020-01-01 (×2): 600 mg via ORAL
  Filled 2019-12-31 (×2): qty 1

## 2019-12-31 MED ORDER — HYDROCODONE-ACETAMINOPHEN 10-325 MG PO TABS
1.0000 | ORAL_TABLET | Freq: Four times a day (QID) | ORAL | Status: DC | PRN
  Administered 2020-01-01: 1 via ORAL
  Filled 2019-12-31: qty 1

## 2019-12-31 MED ORDER — SODIUM CHLORIDE 0.9 % IV SOLN
500.0000 mg | INTRAVENOUS | Status: DC
  Administered 2019-12-31 – 2020-01-04 (×5): 500 mg via INTRAVENOUS
  Filled 2019-12-31 (×6): qty 500

## 2019-12-31 NOTE — H&P (Addendum)
[facility] [place] NAME: James [name]    MR#:  [id number]  DATE OF BIRTH:  1951-06-22  DATE OF ADMISSION:  12/31/2019  PRIMARY CARE PHYSICIAN: Ann Maki, MD   REQUESTING/REFERRING PHYSICIAN: Davonna Belling, MD CHIEF COMPLAINT:   Chief Complaint  Patient presents with   Abdominal Pain    HISTORY OF PRESENT ILLNESS:  James [name]  is a 69 y.o. Caucasian male with a known history of COPD with chronic respiratory failure, on home O2 at 2.5-3 LPM especially with exertion, hypertension, dyslipidemia, who was recently discharged from here on 3/30 after being admitted for COPD acute exacerbation secondary to left lower lobe pneumonia, who presents to the emergency room with acute onset of intractable nausea and vomiting as well as abdominal pain since 4 AM this morning.  The patient started having abdominal distention since last night.  No reported fever or chills.  No diarrhea.  He has been passing gas.  He admits to headache without dizziness or blurred vision.  He continues to have mild cough with expectoration of clear thick sputum as well as occasional mild wheezing and dyspnea on exertion.  Denies any chest pain or palpitations.  No dysuria, oliguria or hematuria or flank pain.  History  is obtained from the patient and his wife, mainly his wife.  Upon presentation to the emergency room, heart rate was 119 and pulse currently 96 sent on 2 L O2 by nasal cannula with otherwise normal vital signs.  Labs are remarkable for borderline potassium of 3.5 and a BUN and creatinine of 33/1.14 compared to 11/1.1 on 3/29 with anion gap of 16.  Albumin was 3.4 with total protein of 6.7.  CBC showed leukocytosis 15.3 compared to 13.3 on 3/29.  Stool Hemoccult came back negative.  COVID-19 PCR is currently pending.  Portable chest ray showed hyperinflated lungs with emphysematous disease and postsurgical changes on the right with right apical soft tissue abnormality better  compared to most recent chest CT and increased atelectasis or mild pneumonia at the left base with possible small left effusion.  Abdominal and pelvic CT scan showed small bowel obstruction with transition point in the left mid abdomen likely related to underlying adhesions, changes of prior colostomy reversal and left lower lobe consolidation that is increased from prior exam. EKG showed atrial fibrillation with rapid ventricular sponsor 120 with rapid progression.  The patient was given 4 mg of IV Zofran.  He will be admitted to a medical monitored bed for further evaluation and management.  PAST MEDICAL HISTORY:   Past Medical History:  Diagnosis Date   Aorta aneurysm ([place])    4.2 ascending thoracic aortic aneursym without evidence of abdominal aortic aneurysm on CTA 11/07/12   Aortic aneurysm ([place])    "unrepaired; has it checked q yr" (05/12/2013)   BPH (benign prostatic hypertrophy)    Cancer of lung ([place])    s/p resection in 2005 at [facility] / [place] patient   Chronic back pain started in 1990's   Chronic lower back pain    Chronic respiratory failure with hypoxia, on home O2 therapy ([place])    3lpm with exertion   Colitis    COPD (chronic obstructive pulmonary disease) ([facility])    Exertional shortness of breath    Exposure to Agent Orange    [place] War   GERD (gastroesophageal reflux disease)    High cholesterol    Hypertension    Liver cancer ([place]) 2017   treated  at [facility], HX OF 11/01/2009   Qualifier: Diagnosis of  By: Ronnald Ramp MD, Arvid Right.    OSA on CPAP    PAD (peripheral artery disease) ([place])     PAST SURGICAL HISTORY:   Past Surgical History:  Procedure Laterality Date   COLON SURGERY     COLOSTOMY  11/08/2012   COLOSTOMY CLOSURE N/A 05/12/2013   Procedure: Exploratory Laparotomy, Lysis of adhesions, Colon resection, Proctoscope, COLOSTOMY CLOSURE;  Surgeon: Joyice Faster. Cornett, MD;  Location: [facility];  Service: General;  Laterality: N/A;  yellow  fin stirrups    COLOSTOMY REVERSAL  05/12/2013   FEMORAL ARTERY STENT Right ?2002   LAPAROTOMY N/A 11/08/2012   Procedure: EXPLORATORY LAPAROTOMY with descending and sigmoid colectomy; wound vac placement and colostomy;  Surgeon: Joyice Faster. Cornett, MD;  Location: [facility];  Service: General;  Laterality: N/A;   LUNG LOBECTOMY Left 2005   "upper lobe; bronchial tube replaced w/piece of lining of his rib" (05/12/2013)    SOCIAL HISTORY:   Social History   Tobacco Use   Smoking status: Former Smoker    Packs/day: 1.00    Years: 30.00    Pack years: 30.00    Types: Cigarettes    Quit date: 10/02/2003    Years since quitting: 16.2   Smokeless tobacco: Current User    Types: Chew  Substance Use Topics   Alcohol use: No    FAMILY HISTORY:   Family History  Problem Relation Age of Onset   Heart attack Mother    Tuberculosis Mother    Heart attack Father    Stroke Father    Heart attack Brother    Heart disease Sister    Cancer Brother        Prostate    DRUG ALLERGIES:   Allergies  Allergen Reactions   Amlodipine Besy-Benazepril Hcl     REACTION: sob, patient can take lisinopril/HCTz, diltiazem (CCB) now   Meloxicam     REACTION: stomach upset   Penicillins     REACTION: Rash Has patient had a PCN reaction causing immediate rash, facial/tongue/throat swelling, SOB or lightheadedness with hypotension: No Has patient had a PCN reaction causing severe rash involving mucus membranes or skin necrosis: No Has patient had a PCN reaction that required hospitalization: No Has patient had a PCN reaction occurring within the last 10 years: No If all of the above answers are "NO", then may proceed with Cephalosporin use.    REVIEW OF SYSTEMS:   ROS As per history of present illness. All pertinent systems were reviewed above. Constitutional,  HEENT, cardiovascular, respiratory, GI, GU, musculoskeletal, neuro, psychiatric, endocrine,  integumentary and hematologic  systems were reviewed and are otherwise  negative/unremarkable except for positive findings mentioned above in the HPI.   MEDICATIONS AT HOME:   Prior to Admission medications   Medication Sig Start Date End Date Taking? Authorizing Provider  albuterol (PROVENTIL HFA;VENTOLIN HFA) 108 (90 BASE) MCG/ACT inhaler Inhale 2 puffs into the lungs every 6 (six) hours as needed for wheezing or shortness of breath. For shortness of breath    [provider]  atorvastatin (LIPITOR) 40 MG tablet Take 20 mg by mouth daily.    [provider]  budesonide-formoterol (SYMBICORT) 160-4.5 MCG/ACT inhaler Inhale 2 puffs into the lungs 2 (two) times daily.    [provider]  dabigatran (PRADAXA) 150 MG CAPS capsule Take 150 mg by mouth 2 (two) times daily. At 10:00 am 2200 pm  [provider]  diltiazem (TIAZAC) 240 MG 24 hr capsule Take 240 mg by mouth 2 (two) times daily.    [provider]  finasteride (PROSCAR) 5 MG tablet Take 5 mg by mouth daily.    [provider]  fluticasone (FLONASE) 50 MCG/ACT nasal spray Place 2 sprays into both nostrils 2 (two) times daily as needed for allergies.     [provider]  gabapentin (NEURONTIN) 300 MG capsule Take 300-600 mg by mouth 2 (two) times daily. 300 mg in morning and 600 mg in the evening    [provider]  guaiFENesin (MUCINEX) 600 MG 12 hr tablet Take 600 mg by mouth daily.    [provider]  lisinopril-hydrochlorothiazide (PRINZIDE,ZESTORETIC) 20-25 MG tablet Take 1 tablet by mouth daily.    [provider]  loratadine (CLARITIN) 10 MG tablet Take 10 mg by mouth daily.    [provider]  memantine (NAMENDA) 10 MG tablet Take 10 mg by mouth 2 (two) times daily.  08/22/19   [provider]  naproxen sodium (ALEVE) 220 MG tablet Take 220 mg by mouth daily as needed (pain).    [provider]  omeprazole (PRILOSEC OTC) 20 MG tablet Take 20 mg  by mouth 2 (two) times daily as needed (Acid reflux).     [provider]  ondansetron (ZOFRAN ODT) 4 MG disintegrating tablet Take 1 tablet (4 mg total) by mouth every 6 (six) hours as needed for nausea or vomiting. Patient not taking: Reported on 07/17/2018 01/30/16   Julianne Rice, MD  oxyCODONE-acetaminophen (ROXICET) 5-325 MG per tablet Take 1 tablet by mouth every 4 (four) hours as needed for pain. Patient not taking: Reported on 07/17/2018 05/19/13   Erroll Luna, MD  predniSONE (DELTASONE) 10 MG tablet Take 4 tablets (40 mg total) by mouth daily for 3 days, THEN 3 tablets (30 mg total) daily for 3 days, THEN 2 tablets (20 mg total) daily for 3 days, THEN 1 tablet (10 mg total) daily for 3 days. 12/29/19 01/10/20  Little Ishikawa, MD  tiotropium (SPIRIVA) 18 MCG inhalation capsule Place 18 mcg into inhaler and inhale daily.    [provider]      VITAL SIGNS:  Blood pressure (!) 146/75, pulse (!) 104, temperature 97.6 F (36.4 C), temperature source Oral, resp. rate 15, SpO2 96 %.  PHYSICAL EXAMINATION:  Physical Exam  GENERAL:  69 y.o.-year-old Caucasian male patient lying in the bed with no acute distress.  EYES: Pupils equal, round, reactive to light and accommodation. No scleral icterus. Extraocular muscles intact.  HEENT: Head atraumatic, normocephalic. Oropharynx and nasopharynx clear.  NECK:  Supple, no jugular venous distention. No thyroid enlargement, no tenderness.  LUNGS: Diminished left basal breath sounds with occasional inspiratory rhonchi and occasional wheezes.  CARDIOVASCULAR: Irregularly irregular minimally tachycardic rhythm, S1, S2 normal. No murmurs, rubs, or gallops.  ABDOMEN: Soft, distended with significant diminished bowel sounds and mild generalized tenderness without rebound tenderness guarding or rigidity.  No organomegaly or mass.  EXTREMITIES: No pedal edema, cyanosis, or clubbing.  NEUROLOGIC: Cranial nerves II through XII are  intact. Muscle strength 5/5 in all extremities. Sensation intact. Gait not checked.  PSYCHIATRIC: The patient is alert and oriented x 3.  Normal affect and good eye contact. SKIN: No obvious rash, lesion, or ulcer.   LABORATORY PANEL:   CBC Recent Labs  Lab 12/31/19 1736  WBC 15.3*  HGB 14.2  HCT 43.4  PLT 310   ------------------------------------------------------------------------------------------------------------------  Chemistries  Recent Labs  Lab 12/28/19 0125 12/28/19 0125 12/31/19 1736  NA 137   < > 138  K 4.4   < > 3.5  CL 100   < > 89*  CO2 24   < > 33*  GLUCOSE 142*   < > 136*  BUN 11   < > 33*  CREATININE 1.10   < > 1.14  CALCIUM 8.8*   < > 9.5  MG 1.7  --   --   AST 24   < > 21  ALT 15   < > 17  ALKPHOS 83   < > 67  BILITOT 0.8   < > 1.2   < > = values in this interval not displayed.   ------------------------------------------------------------------------------------------------------------------  Cardiac Enzymes No results for input(s): TROPONINI in the last 168 hours. ------------------------------------------------------------------------------------------------------------------  RADIOLOGY:  CT ABDOMEN PELVIS W CONTRAST  Result Date: 12/31/2019 CLINICAL DATA:  Abdominal pain and distension with coffee ground emesis EXAM: CT ABDOMEN AND PELVIS WITH CONTRAST TECHNIQUE: Multidetector CT imaging of the abdomen and pelvis was performed using the standard protocol following bolus administration of intravenous contrast. CONTRAST:  18mL OMNIPAQUE IOHEXOL 300 MG/ML  SOLN COMPARISON:  01/30/2016 CT of the abdomen, 12/27/2019, CT of the chest FINDINGS: Lower chest: Right lung base is within normal limits. Left lower lobe consolidation is noted increased from the prior exam of 4 days previous Hepatobiliary: No focal liver abnormality is seen. Status post cholecystectomy. No biliary dilatation. Pancreas: Unremarkable. No pancreatic ductal dilatation or  surrounding inflammatory changes. Spleen: Multiple calcified granulomas are noted within the spleen stable from the prior exam. Adrenals/Urinary Tract: Adrenal glands are within normal limits. The kidneys demonstrate a normal enhancement pattern bilaterally. Some scarring in the left kidney is seen. Normal excretion of contrast is noted. The bladder is well distended. Stomach/Bowel: Colon shows no obstructive or inflammatory changes. Changes of prior colonic resection are seen. The appendix is within normal limits. Stomach is distended with fluid as is the proximal jejunum. Multiple dilated loops of proximal small bowel are noted. There is a transition zone noted in the left mid abdomen best seen on images 39 through 41 of series 3 and image 56 of series 6. Given the patient's clinical history of prior colostomy and reversal these changes are likely related to adhesions. The more distal small bowel appears within normal limits. Vascular/Lymphatic: Aortic atherosclerosis. No enlarged abdominal or pelvic lymph nodes. Reproductive: Prostate is unremarkable. Other: No abdominal wall hernia or abnormality. No abdominopelvic ascites. Musculoskeletal: Degenerative changes of lumbar spine are noted. IMPRESSION: Small-bowel obstruction with a transition point in the left mid abdomen likely related underlying adhesions. Changes of prior colostomy reversal. Left lower lobe consolidation increased from the prior exam. Electronically Signed   By: Inez Catalina M.D.   On: 12/31/2019 20:41   DG Chest Portable 1 View  Result Date: 12/31/2019 CLINICAL DATA:  Cough EXAM: PORTABLE CHEST 1 VIEW COMPARISON:  12/27/2019, CT chest 12/27/2019 FINDINGS: Emphysematous disease. Increased opacity at the left base since prior. Possible small left effusion. Post lobectomy changes on the right. Right apical opacity corresponding to CT soft tissue density. Stable cardiomediastinal silhouette. No pneumothorax. IMPRESSION: 1. Hyperinflated lungs  with emphysematous disease and postsurgical changes on the right. Right apical soft tissue abnormality is better demonstrated on the recent chest CT. 2. Increased atelectasis or mild pneumonia at the left base with possible small left effusion. Electronically Signed   By: Donavan Foil M.D.   On: 12/31/2019  18:20      IMPRESSION AND PLAN:   1.  Small bowel obstruction with intractable nausea and vomiting likely secondary to adhesions.  The patient is status post previous colectomy, colostomy and later reversal of colostomy. -The patient will be admitted to a medical bed. -He will be kept n.p.o. -He will be hydrated with IV normal saline. -We will follow two-view abdomen x-ray in a.m. -We will optimize potassium and check magnesium level. -As needed antiemetics will be provided. -IV PPI therapy will be provided. -We will obtain a general surgery consultation by Dr. Grandville Silos who is aware about the patient.  I discussed the case with him.  2.  Recent left lower lobe pneumonia. -His chest x-ray showing worsening consolidation with left pleural effusion likely parapneumonic. -He will be placed on IV Rocephin and Zithromax while he is here. -Mucolytic therapy will be provided. -Most recent blood cultures came back negative.  We will not repeat them at this time. -The patient will be placed on as needed and scheduled duo nebs.  3.  COPD with chronic respiratory failure on home O2 especially on exertion. -We will switch steroid taper to IV Solu-Medrol given current intractable nausea and vomiting. -The patient will be placed on as needed duo nebs. -We will continue his Spiriva and Symbicort. -O2 protocol will be followed.  4.  Paroxysmal atrial fibrillation with rapid ventricular response. -We will continue Tiazac and hold off Pradaxa for now.   -Rate is currently improving. -We will utilize IV Cardizem as needed. -We added INR to his labs and came back normal  5.  Hypertension. -We will  continue lisinopril and hold off diuretic therapy with HCTZ.  6.  BPH. -Proscar will be resumed.  7.  Dyslipidemia. -We will continue statin therapy.  8.  DVT prophylaxis. -Subcutaneous Lovenox. -Pradaxa is being held off for potential failure of conservative management and need for surgery.   All the records are reviewed and case discussed with ED provider. The plan of care was discussed in details with the patient and his wife.  I answered all questions. The patient agreed to proceed with the above mentioned plan. Further management will depend upon hospital course.   CODE STATUS: This was discussed with the patient and his wife.  The patient is DNR/DNI.  The patient is admitted to inpatient status due to the intensity of medical problems, potential risks involved and management requirement that will necessitate more than 2 midnights.  TOTAL TIME TAKING CARE OF THIS PATIENT: 55 minutes.    Christel Mormon M.D on 12/31/2019 at 9:10 PM  [facility]   From 7 PM-7 AM, contact night-coverage [email or web address]  CC: Primary care physician; Ann Maki, MD   Note: This dictation was prepared with Dragon dictation along with smaller phrase technology. Any transcriptional errors that result from this process are unintentional.

## 2019-12-31 NOTE — ED Triage Notes (Signed)
Pt presents from home with EMS for N/V, abd pain and distention x 1 day. Coffee ground emesis noted by EMS. Seen in ED 2 days ago in ED for COPD.   EMS exam: 130/74, 94% 2L, 154CBG, 18G L hand, afib on monitor  H/o dementia, copd, CA

## 2019-12-31 NOTE — ED Provider Notes (Signed)
[facility] EMERGENCY DEPARTMENT Provider Note   CSN: [id number] Arrival date & time: 12/31/19  1722     History Chief Complaint  Patient presents with   Abdominal Pain    James [name] is a 69 y.o. male. Level 5 caveat due to dementia. HPI Patient with nausea vomiting abdominal pain.  Also abdominal distention.  Reportedly vomited coffee-ground emesis.  Discharged from the ED 2 days ago with pneumonia/COPD.  Had been on antibiotics but currently on steroids.  Does have history of A. fib and is on anticoagulation.  Has had multiple prior abdominal surgeries    Past Medical History:  Diagnosis Date   Aorta aneurysm ([place])    4.2 ascending thoracic aortic aneursym without evidence of abdominal aortic aneurysm on CTA 11/07/12   Aortic aneurysm (HCC)    "unrepaired; has it checked q yr" (05/12/2013)   BPH (benign prostatic hypertrophy)    Cancer of lung ([place])    s/p resection in 2005 at [facility] / [place] patient   Chronic back pain started in 1990's   Chronic lower back pain    Chronic respiratory failure with hypoxia, on home O2 therapy ([place])    3lpm with exertion   Colitis    COPD (chronic obstructive pulmonary disease) ([place])    Exertional shortness of breath    Exposure to Agent Orange    [place] War   GERD (gastroesophageal reflux disease)    High cholesterol    Hypertension    Liver cancer ([place]) 2017   treated at [facility], HX OF 11/01/2009   Qualifier: Diagnosis of  By: Ronnald Ramp MD, Arvid Right.    OSA on CPAP    PAD (peripheral artery disease) [facility])     Patient Active Problem List   Diagnosis Date Noted   Hyponatremia 12/27/2019   Pneumonia of left lower lobe due to infectious organism 12/27/2019   Dementia with behavioral disturbance ([place]) 12/27/2019   COPD exacerbation ([place]) 12/27/2019   SBO (small bowel obstruction) ([place]) 01/30/2016   Pre-operative cardiovascular examination, supraventricular arrhythmia 04/04/2013    Shortness of breath    Post-operative state 12/02/2012   Ischemic colitis ([place]) 11/13/2012   Paroxysmal atrial fibrillation ([place]) 11/07/2012   COPD with acute exacerbation ([place]) 11/07/2012   Elevated alkaline phosphatase level 11/07/2012   HYPERLIPIDEMIA 11/01/2009   HYPERTENSION 11/01/2009   PERIPHERAL VASCULAR DISEASE 11/01/2009   GERD 11/01/2009   BENIGN PROSTATIC HYPERTROPHY 11/01/2009   HERNIATED LUMBAR DISC 11/01/2009   LOW BACK PAIN 11/01/2009   LUNG CANCER, HX OF 11/01/2009   DIVERTICULITIS, HX OF 11/01/2009    Past Surgical History:  Procedure Laterality Date   COLON SURGERY     COLOSTOMY  11/08/2012   COLOSTOMY CLOSURE N/A 05/12/2013   Procedure: Exploratory Laparotomy, Lysis of adhesions, Colon resection, Proctoscope, COLOSTOMY CLOSURE;  Surgeon: Joyice Faster. Cornett, MD;  Location: [place];  Service: General;  Laterality: N/A;  yellow fin stirrups    COLOSTOMY REVERSAL  05/12/2013   FEMORAL ARTERY STENT Right ?2002   LAPAROTOMY N/A 11/08/2012   Procedure: EXPLORATORY LAPAROTOMY with descending and sigmoid colectomy; wound vac placement and colostomy;  Surgeon: Joyice Faster. Cornett, MD;  Location: [place];  Service: General;  Laterality: N/A;   LUNG LOBECTOMY Left 2005   "upper lobe; bronchial tube replaced w/piece of lining of his rib" (05/12/2013)       Family History  Problem Relation Age of Onset   Heart attack Mother    Tuberculosis  Mother    Heart attack Father    Stroke Father    Heart attack Brother    Heart disease Sister    Cancer Brother        Prostate    Social History   Tobacco Use   Smoking status: Former Smoker    Packs/day: 1.00    Years: 30.00    Pack years: 30.00    Types: Cigarettes    Quit date: 10/02/2003    Years since quitting: 16.2   Smokeless tobacco: Current User    Types: Chew  Substance Use Topics   Alcohol use: No   Drug use: No    Home Medications Prior to Admission medications   Medication Sig  Start Date End Date Taking? Authorizing Provider  albuterol (PROVENTIL HFA;VENTOLIN HFA) 108 (90 BASE) MCG/ACT inhaler Inhale 2 puffs into the lungs every 6 (six) hours as needed for wheezing or shortness of breath. For shortness of breath    [provider]  atorvastatin (LIPITOR) 40 MG tablet Take 20 mg by mouth daily.    [provider]  budesonide-formoterol (SYMBICORT) 160-4.5 MCG/ACT inhaler Inhale 2 puffs into the lungs 2 (two) times daily.    [provider]  dabigatran (PRADAXA) 150 MG CAPS capsule Take 150 mg by mouth 2 (two) times daily. At 10:00 am 2200 pm    [provider]  diltiazem (TIAZAC) 240 MG 24 hr capsule Take 240 mg by mouth 2 (two) times daily.    [provider]  finasteride (PROSCAR) 5 MG tablet Take 5 mg by mouth daily.    [provider]  fluticasone (FLONASE) 50 MCG/ACT nasal spray Place 2 sprays into both nostrils 2 (two) times daily as needed for allergies.     [provider]  gabapentin (NEURONTIN) 300 MG capsule Take 300-600 mg by mouth 2 (two) times daily. 300 mg in morning and 600 mg in the evening    [provider]  guaiFENesin (MUCINEX) 600 MG 12 hr tablet Take 600 mg by mouth daily.    [provider]  lisinopril-hydrochlorothiazide (PRINZIDE,ZESTORETIC) 20-25 MG tablet Take 1 tablet by mouth daily.    [provider]  loratadine (CLARITIN) 10 MG tablet Take 10 mg by mouth daily.    [provider]  memantine (NAMENDA) 10 MG tablet Take 10 mg by mouth 2 (two) times daily.  08/22/19   [provider]  naproxen sodium (ALEVE) 220 MG tablet Take 220 mg by mouth daily as needed (pain).    [provider]  omeprazole (PRILOSEC OTC) 20 MG tablet Take 20 mg by mouth 2 (two) times daily as needed (Acid reflux).     [provider]  ondansetron (ZOFRAN ODT) 4 MG disintegrating tablet Take 1 tablet (4 mg total) by mouth every 6 (six) hours as  needed for nausea or vomiting. Patient not taking: Reported on 07/17/2018 01/30/16   Julianne Rice, MD  oxyCODONE-acetaminophen (ROXICET) 5-325 MG per tablet Take 1 tablet by mouth every 4 (four) hours as needed for pain. Patient not taking: Reported on 07/17/2018 05/19/13   Erroll Luna, MD  predniSONE (DELTASONE) 10 MG tablet Take 4 tablets (40 mg total) by mouth daily for 3 days, THEN 3 tablets (30 mg total) daily for 3 days, THEN 2 tablets (20 mg total) daily for 3 days, THEN 1 tablet (10 mg total) daily for 3 days. 12/29/19 01/10/20  Little Ishikawa, MD  tiotropium (SPIRIVA) 18 MCG inhalation capsule Place 18 mcg  into inhaler and inhale daily.    [provider]    Allergies    Amlodipine besy-benazepril hcl, Meloxicam, and Penicillins  Review of Systems   Review of Systems  Unable to perform ROS: Mental status change    Physical Exam Updated Vital Signs BP 133/75    Pulse 95    Temp 97.6 F (36.4 C) (Oral)    Resp 15    SpO2 95%   Physical Exam Vitals reviewed.  HENT:     Head: Normocephalic.  Cardiovascular:     Rate and Rhythm: Tachycardia present. Rhythm irregular.  Pulmonary:     Comments: Somewhat harsh breath sounds. Abdominal:     General: There is distension.     Tenderness: There is abdominal tenderness.     Comments: Somewhat diffuse abdominal tenderness without rebound or guarding.  Some distention.  No clear hernia palpated.  Skin:    General: Skin is warm.     Capillary Refill: Capillary refill takes less than 2 seconds.  Neurological:     Mental Status: He is alert.     Comments: Awake and pleasant, but some confusion.  Reportedly at baseline.     ED Results / Procedures / Treatments   Labs (all labs ordered are listed, but only abnormal results are displayed) Labs Reviewed  COMPREHENSIVE METABOLIC PANEL - Abnormal; Notable for the following components:      Result Value   Chloride 89 (*)    CO2 33 (*)    Glucose, Bld 136 (*)     BUN 33 (*)    Albumin 3.4 (*)    Anion gap 16 (*)    All other components within normal limits  CBC - Abnormal; Notable for the following components:   WBC 15.3 (*)    All other components within normal limits  POC OCCULT BLOOD, ED  TYPE AND SCREEN  ABO/RH    EKG EKG Interpretation  Date/Time:  Thursday December 31 2019 17:30:49 EDT Ventricular Rate:  120 PR Interval:    QRS Duration: 96 QT Interval:  316 QTC Calculation: 447 R Axis:   85 Text Interpretation: Atrial fibrillation Probable anterior infarct, old Borderline repolarization abnormality Confirmed by Davonna Belling [phone]) on 12/31/2019 6:06:56 PM   Radiology CT ABDOMEN PELVIS W CONTRAST  Result Date: 12/31/2019 CLINICAL DATA:  Abdominal pain and distension with coffee ground emesis EXAM: CT ABDOMEN AND PELVIS WITH CONTRAST TECHNIQUE: Multidetector CT imaging of the abdomen and pelvis was performed using the standard protocol following bolus administration of intravenous contrast. CONTRAST:  188mL OMNIPAQUE IOHEXOL 300 MG/ML  SOLN COMPARISON:  01/30/2016 CT of the abdomen, 12/27/2019, CT of the chest FINDINGS: Lower chest: Right lung base is within normal limits. Left lower lobe consolidation is noted increased from the prior exam of 4 days previous Hepatobiliary: No focal liver abnormality is seen. Status post cholecystectomy. No biliary dilatation. Pancreas: Unremarkable. No pancreatic ductal dilatation or surrounding inflammatory changes. Spleen: Multiple calcified granulomas are noted within the spleen stable from the prior exam. Adrenals/Urinary Tract: Adrenal glands are within normal limits. The kidneys demonstrate a normal enhancement pattern bilaterally. Some scarring in the left kidney is seen. Normal excretion of contrast is noted. The bladder is well distended. Stomach/Bowel: Colon shows no obstructive or inflammatory changes. Changes of prior colonic resection are seen. The appendix is within normal limits. Stomach is  distended with fluid as is the proximal jejunum. Multiple dilated loops of proximal small bowel are noted. There is a transition zone noted  in the left mid abdomen best seen on images 39 through 41 of series 3 and image 56 of series 6. Given the patient's clinical history of prior colostomy and reversal these changes are likely related to adhesions. The more distal small bowel appears within normal limits. Vascular/Lymphatic: Aortic atherosclerosis. No enlarged abdominal or pelvic lymph nodes. Reproductive: Prostate is unremarkable. Other: No abdominal wall hernia or abnormality. No abdominopelvic ascites. Musculoskeletal: Degenerative changes of lumbar spine are noted. IMPRESSION: Small-bowel obstruction with a transition point in the left mid abdomen likely related underlying adhesions. Changes of prior colostomy reversal. Left lower lobe consolidation increased from the prior exam. Electronically Signed   By: Inez Catalina M.D.   On: 12/31/2019 20:41   DG Chest Portable 1 View  Result Date: 12/31/2019 CLINICAL DATA:  Cough EXAM: PORTABLE CHEST 1 VIEW COMPARISON:  12/27/2019, CT chest 12/27/2019 FINDINGS: Emphysematous disease. Increased opacity at the left base since prior. Possible small left effusion. Post lobectomy changes on the right. Right apical opacity corresponding to CT soft tissue density. Stable cardiomediastinal silhouette. No pneumothorax. IMPRESSION: 1. Hyperinflated lungs with emphysematous disease and postsurgical changes on the right. Right apical soft tissue abnormality is better demonstrated on the recent chest CT. 2. Increased atelectasis or mild pneumonia at the left base with possible small left effusion. Electronically Signed   By: Donavan Foil M.D.   On: 12/31/2019 18:20    Procedures Procedures (including critical care time)  Medications Ordered in ED Medications  ondansetron (ZOFRAN) injection 4 mg (4 mg Intravenous Given 12/31/19 1807)  iohexol (OMNIPAQUE) 300 MG/ML solution  100 mL (100 mLs Intravenous Contrast Given 12/31/19 2020)    ED Course  I have reviewed the triage vital signs and the nursing notes.  Pertinent labs & imaging results that were available during my care of the patient were reviewed by me and considered in my medical decision making (see chart for details).    MDM Rules/Calculators/A&P                      Patient presents with nausea and vomiting.  History of bowel obstructions.  Discharge from hospital 2 days ago.  CT scan done and shows possible bowel obstruction.  Recently treated for pneumonia also has increased consolidation on CT scan.  Patient still has some nausea but not vomited since here.  Will admit to internal medicine but will consult general surgery.  Last negative Covid test was 4 days ago.    Final Clinical Impression(s) / ED Diagnoses Final diagnoses:  Small bowel obstruction due to adhesions [facility])    Rx / DC Orders ED Discharge Orders    None       Davonna Belling, MD 12/31/19 2050

## 2019-12-31 NOTE — ED Notes (Signed)
Pt transported to CT ?

## 2020-01-01 ENCOUNTER — Inpatient Hospital Stay (HOSPITAL_COMMUNITY): Payer: Non-veteran care

## 2020-01-01 DIAGNOSIS — N400 Enlarged prostate without lower urinary tract symptoms: Secondary | ICD-10-CM

## 2020-01-01 DIAGNOSIS — I480 Paroxysmal atrial fibrillation: Secondary | ICD-10-CM

## 2020-01-01 DIAGNOSIS — J9611 Chronic respiratory failure with hypoxia: Secondary | ICD-10-CM

## 2020-01-01 DIAGNOSIS — J449 Chronic obstructive pulmonary disease, unspecified: Secondary | ICD-10-CM

## 2020-01-01 DIAGNOSIS — I10 Essential (primary) hypertension: Secondary | ICD-10-CM

## 2020-01-01 DIAGNOSIS — Z789 Other specified health status: Secondary | ICD-10-CM

## 2020-01-01 DIAGNOSIS — Z66 Do not resuscitate: Secondary | ICD-10-CM

## 2020-01-01 DIAGNOSIS — F0390 Unspecified dementia without behavioral disturbance: Secondary | ICD-10-CM

## 2020-01-01 LAB — CBC
HCT: 38.4 % — ABNORMAL LOW (ref 39.0–52.0)
Hemoglobin: 12.5 g/dL — ABNORMAL LOW (ref 13.0–17.0)
MCH: 30.2 pg (ref 26.0–34.0)
MCHC: 32.6 g/dL (ref 30.0–36.0)
MCV: 92.8 fL (ref 80.0–100.0)
Platelets: 281 10*3/uL (ref 150–400)
RBC: 4.14 MIL/uL — ABNORMAL LOW (ref 4.22–5.81)
RDW: 13.8 % (ref 11.5–15.5)
WBC: 12.9 10*3/uL — ABNORMAL HIGH (ref 4.0–10.5)
nRBC: 0 % (ref 0.0–0.2)

## 2020-01-01 LAB — CULTURE, BLOOD (ROUTINE X 2)
Culture: NO GROWTH
Culture: NO GROWTH
Special Requests: ADEQUATE
Special Requests: ADEQUATE

## 2020-01-01 LAB — BASIC METABOLIC PANEL
Anion gap: 12 (ref 5–15)
BUN: 32 mg/dL — ABNORMAL HIGH (ref 8–23)
CO2: 31 mmol/L (ref 22–32)
Calcium: 8.8 mg/dL — ABNORMAL LOW (ref 8.9–10.3)
Chloride: 94 mmol/L — ABNORMAL LOW (ref 98–111)
Creatinine, Ser: 1.11 mg/dL (ref 0.61–1.24)
GFR calc Af Amer: >60 mL/min (ref >60)
GFR calc non Af Amer: >60 mL/min (ref >60)
Glucose, Bld: 128 mg/dL — ABNORMAL HIGH (ref 70–99)
Potassium: 3.9 mmol/L (ref 3.5–5.1)
Sodium: 137 mmol/L (ref 135–145)

## 2020-01-01 LAB — SARS CORONAVIRUS 2 (TAT 6-24 HRS): SARS Coronavirus 2: NEGATIVE

## 2020-01-01 MED ORDER — MEMANTINE HCL 10 MG PO TABS
10.0000 mg | ORAL_TABLET | Freq: Two times a day (BID) | ORAL | Status: DC
  Administered 2020-01-01 – 2020-01-05 (×6): 10 mg via ORAL
  Filled 2020-01-01 (×7): qty 1

## 2020-01-01 MED ORDER — KCL IN DEXTROSE-NACL 20-5-0.9 MEQ/L-%-% IV SOLN
INTRAVENOUS | Status: DC
  Filled 2020-01-01 (×5): qty 1000

## 2020-01-01 MED ORDER — METOPROLOL TARTRATE 5 MG/5ML IV SOLN
2.5000 mg | Freq: Four times a day (QID) | INTRAVENOUS | Status: DC
  Administered 2020-01-01 – 2020-01-04 (×11): 2.5 mg via INTRAVENOUS
  Filled 2020-01-01 (×11): qty 5

## 2020-01-01 MED ORDER — PHENOL 1.4 % MT LIQD
1.0000 | OROMUCOSAL | Status: DC | PRN
  Filled 2020-01-01: qty 177

## 2020-01-01 MED ORDER — FENTANYL CITRATE (PF) 100 MCG/2ML IJ SOLN
50.0000 ug | INTRAMUSCULAR | Status: DC | PRN
  Administered 2020-01-01: 50 ug via INTRAVENOUS
  Filled 2020-01-01: qty 2

## 2020-01-01 MED ORDER — DIATRIZOATE MEGLUMINE & SODIUM 66-10 % PO SOLN
90.0000 mL | Freq: Once | ORAL | Status: AC
  Administered 2020-01-01: 19:00:00 90 mL via NASOGASTRIC
  Filled 2020-01-01: qty 90

## 2020-01-01 NOTE — Consult Note (Signed)
Reason for Consult:SBO Referring Physician: Dr. Percell Locus James [name] is an 69 y.o. male.  HPI: 69yo m with a history of multiple medical problems, dementia and recent admission for pneumonia presented to the emergency department with nausea and vomiting.  He is a poor historian but his wife is able to give some history.  He is status post open cholecystectomy, partial colectomy with colostomy, and colostomy takedown in the past.  He has also had small bowel obstructions in the past.  He developed some abdominal discomfort and vomiting about 36 hours ago.  It persisted and he was brought by EMS to the emergency department.  Work-up showed small bowel obstruction and I was asked to see him from a surgical standpoint.  He has vomited some green emesis since admission.  Past Medical History:  Diagnosis Date  . Aorta aneurysm (HCC)    4.2 ascending thoracic aortic aneursym without evidence of abdominal aortic aneurysm on CTA 11/07/12  . Aortic aneurysm ([place])    "unrepaired; has it checked q yr" (05/12/2013)  . BPH (benign prostatic hypertrophy)   . Cancer of lung [facility])    s/p resection in 2005 at [facility] / [place] patient  . Chronic back pain started in 1990's  . Chronic lower back pain   . Chronic respiratory failure with hypoxia, on home O2 therapy (HCC)    3lpm with exertion  . Colitis   . COPD (chronic obstructive pulmonary disease) ([place])   . Exertional shortness of breath   . Exposure to Agent Orange    [place] War  . GERD (gastroesophageal reflux disease)   . High cholesterol   . Hypertension   . Liver cancer ([place]) 2017   [facility] at [facility]  . LUNG CANCER, HX OF 11/01/2009   Qualifier: Diagnosis of  By: Ronnald Ramp MD, Arvid Right.   . OSA on CPAP   . PAD (peripheral artery disease) ([place])     Past Surgical History:  Procedure Laterality Date  . COLON SURGERY    . COLOSTOMY  11/08/2012  . COLOSTOMY CLOSURE N/A 05/12/2013   Procedure: Exploratory Laparotomy, Lysis of adhesions, Colon resection,  Proctoscope, COLOSTOMY CLOSURE;  Surgeon: Joyice Faster. Cornett, MD;  Location: [place];  Service: General;  Laterality: N/A;  yellow fin stirrups   . COLOSTOMY REVERSAL  05/12/2013  . FEMORAL ARTERY STENT Right ?2002  . LAPAROTOMY N/A 11/08/2012   Procedure: EXPLORATORY LAPAROTOMY with descending and sigmoid colectomy; wound vac placement and colostomy;  Surgeon: Joyice Faster. Cornett, MD;  Location: [place];  Service: General;  Laterality: N/A;  . LUNG LOBECTOMY Left 2005   "upper lobe; bronchial tube replaced w/piece of lining of his rib" (05/12/2013)    Family History  Problem Relation Age of Onset  . Heart attack Mother   . Tuberculosis Mother   . Heart attack Father   . Stroke Father   . Heart attack Brother   . Heart disease Sister   . Cancer Brother        Prostate    Social History:  reports that he quit smoking about 16 years ago. His smoking use included cigarettes. He has a 30.00 pack-year smoking history. His smokeless tobacco use includes chew. He reports that he does not drink alcohol or use drugs.  Allergies:  Allergies  Allergen Reactions  . Amlodipine Besy-Benazepril Hcl Shortness Of Breath  . Meloxicam Nausea Only and Other (See Comments)    Stomach upset  . Penicillins Rash    Has patient  had a PCN reaction causing immediate rash, facial/tongue/throat swelling, SOB or lightheadedness with hypotension: No Has patient had a PCN reaction causing severe rash involving mucus membranes or skin necrosis: No Has patient had a PCN reaction that required hospitalization: No Has patient had a PCN reaction occurring within the last 10 years: No If all of the above answers are "NO", then may proceed with Cephalosporin use.    Medications: I have reviewed the patient's current medications.  Results for orders placed or performed during the hospital encounter of 12/31/19 (from the past 48 hour(s))  Comprehensive metabolic panel     Status: Abnormal   Collection Time: 12/31/19  5:36 PM   Result Value Ref Range   Sodium 138 135 - 145 mmol/L   Potassium 3.5 3.5 - 5.1 mmol/L   Chloride 89 (L) 98 - 111 mmol/L   CO2 33 (H) 22 - 32 mmol/L   Glucose, Bld 136 (H) 70 - 99 mg/dL    Comment: Glucose reference range applies only to samples taken after fasting for at least 8 hours.   BUN 33 (H) 8 - 23 mg/dL   Creatinine, Ser 1.14 0.61 - 1.24 mg/dL   Calcium 9.5 8.9 - 10.3 mg/dL   Total Protein 6.7 6.5 - 8.1 g/dL   Albumin 3.4 (L) 3.5 - 5.0 g/dL   AST 21 15 - 41 U/L   ALT 17 0 - 44 U/L   Alkaline Phosphatase 67 38 - 126 U/L   Total Bilirubin 1.2 0.3 - 1.2 mg/dL   GFR calc non Af Amer >60 >60 mL/min   GFR calc Af Amer >60 >60 mL/min   Anion gap 16 (H) 5 - 15    Comment: Performed at [facility] [address], [place], [place] [address]  CBC     Status: Abnormal   Collection Time: 12/31/19  5:36 PM  Result Value Ref Range   WBC 15.3 (H) 4.0 - 10.5 K/uL   RBC 4.70 4.22 - 5.81 MIL/uL   Hemoglobin 14.2 13.0 - 17.0 g/dL   HCT 43.4 39.0 - 52.0 %   MCV 92.3 80.0 - 100.0 fL   MCH 30.2 26.0 - 34.0 pg   MCHC 32.7 30.0 - 36.0 g/dL   RDW 13.7 11.5 - 15.5 %   Platelets 310 150 - 400 K/uL   nRBC 0.0 0.0 - 0.2 %    Comment: Performed at [facility], [place] [address] [place], [place] [address]  Protime-INR     Status: None   Collection Time: 12/31/19  5:36 PM  Result Value Ref Range   Prothrombin Time 14.5 11.4 - 15.2 seconds   INR 1.1 0.8 - 1.2    Comment: (NOTE) INR goal varies based on device and disease states. Performed at [facility], [place] [address] [place], [place] [address]   APTT     Status: Abnormal   Collection Time: 12/31/19  5:36 PM  Result Value Ref Range   aPTT 40 (H) 24 - 36 seconds    Comment:        IF BASELINE aPTT IS ELEVATED, SUGGEST PATIENT RISK ASSESSMENT BE USED TO DETERMINE APPROPRIATE ANTICOAGULANT THERAPY. Performed at [facility], [place] [address] [place], [place] [address]   Type and screen [place]     Status: None   Collection Time: 12/31/19  5:38 PM  Result Value Ref Range   ABO/RH(D) A POS    Antibody Screen  NEG    Sample Expiration      01/03/2020,2359 Performed at [facility], [place] [address], [place], [place] [address]   ABO/Rh     Status: None   Collection Time: 12/31/19  5:38 PM  Result Value Ref Range   ABO/RH(D)      A POS Performed at [facility] [address] [place], [place] [address]   POC occult blood, ED     Status: None   Collection Time: 12/31/19  6:06 PM  Result Value Ref Range   Fecal Occult Bld NEGATIVE NEGATIVE  Magnesium     Status: None   Collection Time: 12/31/19 10:52 PM  Result Value Ref Range   Magnesium 2.0 1.7 - 2.4 mg/dL    Comment: Performed at [facility], [place] [address] [place], [place] [address]    CT ABDOMEN PELVIS W CONTRAST  Result Date: 12/31/2019 CLINICAL DATA:  Abdominal pain and distension with coffee ground emesis EXAM: CT ABDOMEN AND PELVIS WITH CONTRAST TECHNIQUE: Multidetector CT imaging of the abdomen and pelvis was performed using the standard protocol following bolus administration of intravenous contrast. CONTRAST:  116mL OMNIPAQUE IOHEXOL 300 MG/ML  SOLN COMPARISON:  01/30/2016 CT of the abdomen, 12/27/2019, CT of the chest FINDINGS: Lower chest: Right lung base is within normal limits. Left lower lobe consolidation is noted increased from the prior exam of 4 days previous Hepatobiliary: No focal liver abnormality is seen. Status post cholecystectomy. No biliary dilatation. Pancreas: Unremarkable. No pancreatic ductal dilatation or surrounding inflammatory changes. Spleen: Multiple calcified granulomas are noted within the spleen stable from the prior exam. Adrenals/Urinary Tract: Adrenal glands are within normal limits. The kidneys demonstrate a normal enhancement pattern bilaterally. Some scarring in the left kidney is seen. Normal excretion of contrast is noted. The bladder is well distended.  Stomach/Bowel: Colon shows no obstructive or inflammatory changes. Changes of prior colonic resection are seen. The appendix is within normal limits. Stomach is distended with fluid as is the proximal jejunum. Multiple dilated loops of proximal small bowel are noted. There is a transition zone noted in the left mid abdomen best seen on images 39 through 41 of series 3 and image 56 of series 6. Given the patient's clinical history of prior colostomy and reversal these changes are likely related to adhesions. The more distal small bowel appears within normal limits. Vascular/Lymphatic: Aortic atherosclerosis. No enlarged abdominal or pelvic lymph nodes. Reproductive: Prostate is unremarkable. Other: No abdominal wall hernia or abnormality. No abdominopelvic ascites. Musculoskeletal: Degenerative changes of lumbar spine are noted. IMPRESSION: Small-bowel obstruction with a transition point in the left mid abdomen likely related underlying adhesions. Changes of prior colostomy reversal. Left lower lobe consolidation increased from the prior exam. Electronically Signed   By: Inez Catalina M.D.   On: 12/31/2019 20:41   DG Chest Portable 1 View  Result Date: 12/31/2019 CLINICAL DATA:  Cough EXAM: PORTABLE CHEST 1 VIEW COMPARISON:  12/27/2019, CT chest 12/27/2019 FINDINGS: Emphysematous disease. Increased opacity at the left base since prior. Possible small left effusion. Post lobectomy changes on the right. Right apical opacity corresponding to CT soft tissue density. Stable cardiomediastinal silhouette. No pneumothorax. IMPRESSION: 1. Hyperinflated lungs with emphysematous disease and postsurgical changes on the right. Right apical soft tissue abnormality is better demonstrated on the recent chest CT. 2. Increased atelectasis or mild pneumonia at the left base with possible small left effusion. Electronically Signed   By: Donavan Foil M.D.   On:  12/31/2019 18:20    Review of Systems  Unable to perform ROS: Dementia    Blood pressure (!) 147/69, pulse (!) 104, temperature 98.4 F (36.9 C), temperature source Oral, resp. rate 17, SpO2 91 %. Physical Exam  Constitutional: He appears well-developed and well-nourished.  HENT:  Right Ear: External ear normal.  Mouth/Throat: Oropharynx is clear and moist.  Eyes: Pupils are equal, round, and reactive to light. Right eye exhibits no discharge. Left eye exhibits no discharge. No scleral icterus.  Neck: No tracheal deviation present. No thyromegaly present.  Cardiovascular: Normal rate and normal heart sounds.  Respiratory: Effort normal and breath sounds normal. No respiratory distress. He has no wheezes. He has no rales.  GI: Soft. He exhibits distension. There is no abdominal tenderness. There is no rebound and no guarding.  Distended but not significantly tender.  Hypoactive bowel sounds.  No hepatosplenomegaly.  Musculoskeletal:        General: No edema. Normal range of motion.  Neurological:  Awake and follows commands, not able to provide history  Skin: Skin is warm.    Assessment/Plan: Small bowel obstruction -I recommended NG tube placement and small bowel obstruction protocol.  I discussed this with his wife.  She reports he has had NG tube in the past and he always forgets why it is there and pulls it out.  She also reports that she does not want him to undergo any surgery in light of his dementia and other medical problems.  Considering this, continue n.p.o., IV fluids.  We will follow.  No need for emergent surgery.  Zenovia Jarred 01/01/2020, 1:49 AM

## 2020-01-01 NOTE — TOC Initial Note (Signed)
Transition of Care [facility]) - Initial/Assessment Note    Patient Details  Name: James [name] MRN: [id number] Date of Birth: 11-04-1950  Transition of [facility]) CM/SW Contact:    Alexander Mt, LCSW Phone Number: 01/01/2020, 3:57 PM  Clinical Narrative:                 CSW spoke with pt at bedside. Pt daughter also present. CSW introduced self, role, reason for visit. Pt from home w/ his wife, confirmed that he uses [facility] for PCP and is in the process of changing to [facility] for primary care. Pt has home oxygen through [facility], he has some questions about home oxygen, this Probation officer will f/u with [employer] and [employer]. CSW also confirmed pt ambulates without DME at this time. Pt daughter with questions about aides, discussed that [facility] may be able to direct them to how to apply for in home assistance with items like medications etc.   Let family and pt know TOC team will follow for care and any recommendations.   Expected Discharge Plan: Home/Self Care Barriers to Discharge: Continued Medical Work up   Patient Goals and CMS Choice Patient states their goals for this hospitalization and ongoing recovery are:: return home; perhaps some additional services at home CMS Medicare.gov Compare Post Acute Care list provided to:: (n.a) Choice offered to / list presented to : NA  Expected Discharge Plan and Services Expected Discharge Plan: Home/Self Care In-house Referral: Clinical Social Work Discharge Planning Services: CM Consult Post Acute Care Choice: (TBD) Living arrangements for the past 2 months: [place]                 DME Arranged: Oxygen DME Agency: [facility]                  Prior Living Arrangements/Services Living arrangements for the past 2 months: Single Family Home Lives with:: Spouse Patient language and need for interpreter reviewed:: Yes(no needs) Do you feel safe going back to the place where you live?: Yes       Need for Family Participation in Patient Care: Yes (Comment) Care giver support system in place?: Yes (comment) Current home services: DME Criminal Activity/Legal Involvement Pertinent to Current Situation/Hospitalization: No - Comment as needed   Permission Sought/Granted Permission sought to share information with : Family Supports Permission granted to share information with : Yes, Verbal Permission Granted  Share Information with NAME: Liz Malady  Permission granted to share info w AGENC[facility]: [facility]  Permission granted to share info w Relationship: daughter  Permission granted to share info w Contact Information: [phone]  Emotional Assessment Appearance:: Appears stated age Attitude/Demeanor/Rapport: Engaged Affect (typically observed): Accepting, Appropriate Orientation: : Oriented to Self, Oriented to Place, Oriented to  Time, Oriented to Situation Alcohol / Substance Use: Not Applicable Psych Involvement: (n/a)  Admission diagnosis:  SBO (small bowel obstruction) ([place]) [K56.609] Small bowel obstruction due to adhesions [facility]) [K56.50] Patient Active Problem List   Diagnosis Date Noted  . Hyponatremia 12/27/2019  . Pneumonia of left lower lobe due to infectious organism 12/27/2019  . Dementia with behavioral disturbance ([place]) 12/27/2019  . COPD exacerbation ([place]) 12/27/2019  . SBO (small bowel obstruction) ([place]) 01/30/2016  . Pre-operative cardiovascular examination, supraventricular arrhythmia 04/04/2013  . Shortness of breath   . Post-operative state 12/02/2012  . Ischemic colitis ([place]) 11/13/2012  . Paroxysmal atrial fibrillation ([place]) 11/07/2012  . COPD with acute exacerbation ([place]) 11/07/2012  .  Elevated alkaline phosphatase level 11/07/2012  . HYPERLIPIDEMIA 11/01/2009  . HYPERTENSION 11/01/2009  . PERIPHERAL VASCULAR DISEASE 11/01/2009  . GERD 11/01/2009  . BENIGN PROSTATIC HYPERTROPHY 11/01/2009  . HERNIATED LUMBAR DISC 11/01/2009  . LOW BACK  PAIN 11/01/2009  . LUNG CANCER, HX OF 11/01/2009  . DIVERTICULITIS, HX OF 11/01/2009   PCP:  Ann Maki, MD Pharmacy:   [facility] [place] [address] Phone: [phone] Fax: [phone]  [facility] [place], [place] - [address] [place] [address] Phone: [phone] Fax: [phone]  [place], [place] - [place] [address] Phone: [phone] Fax: [place] [address] Phone: [phone] Fax: [phone]   Readmission Risk Interventions Readmission Risk Prevention Plan 01/01/2020  Transportation Screening Complete  PCP or Specialist Appt within 5-7 Days Not Complete  Not Complete comments pending stability  Home Care Screening Complete  Medication Review (RN CM) Referral to Pharmacy  Some recent data might be hidden

## 2020-01-01 NOTE — Care Management (Addendum)
Patient receives home oxygen through [facility]. Wife wanting more tanks. Called [facility] and left a message. Awaiting call back.    Tammy from [facility] returned call. Patient's home oxygen prescription is for night time only.  Patient has one tank and one concentrator. If patient needs oxygen 24/7 patient would need a prescription through the [place]. Tammy advised wife to call [facility]. NCM called and left message. Awaiting call back.    Provided patient and daughter with above information, and my direct number.   Debbie from [facility] home oxygen Dept, returned call. She is requesting Oxygen saturation note ( asked nurse for note) and if needed MD order for continuous oxygen at home. Debbie requesting both to be faxed to her by 4 pm today, at [phone]. Spoke with attending and instructed this is not a good time for ambulatory saturation due to small bowel obstruction this can be done at a later date. Bedside nurse Bernie aware , went to tell patient and daughter . Daughter not at bedside at present will check back.   Patient and wife aware. Left message for Jackelyn Poling at [facility].      James [name]

## 2020-01-01 NOTE — Progress Notes (Signed)
PROGRESS NOTE  James [name] QMG:[id number] DOB: 1951/02/26   PCP: Ann Maki, MD  Patient is from: Home.  DOA: 12/31/2019 LOS: 1  Brief Narrative / Interim history: 69 year old male with history of COPD/chronic RF on 2 to 3 L, PAF on Pradaxa, dementia, lung cancer status post lobectomy, AAA, multiple abdominal surgery (laparotomy, colostomy and colostomy reversal in 2014) and recent hospitalization for COPD exacerbation from 3/27-3/30 presenting with nausea, vomiting and abdominal pain, and admitted for small bowel obstruction and possible pneumonia.  Last bowel movements about 4 days prior to admission.  In ED, in A. fib with mild RVR to 120.  WBC 15.3. Cr 1.14 (about baseline).  BUN 33.  K3.5.  AG 16.  CBC and CMP suggestive for dehydration.  EKG with A. fib and RVR to 120s.  FOBT negative.  COVID-19 negative.  CXR with LLL opacity and emphysematous changes. CT abdomen and pelvis revealed SBO with transition point in left mid abdomen likely related to underlying adhesions, and increased LLL consolidation. Patient was started on IV fluid, ceftriaxone, azithromycin and antiemetics, and admitted for SBO and LLL pneumonia..  General surgery consulted.  Subjective: Seen and examined earlier this morning.  No major events overnight or this morning.  No complaints.  He denies chest pain, dyspnea, cough, nausea, vomiting or abdominal pain.  Patient is not a great historian due to advanced dementia.  He is awake and alert but oriented only to self and family member.  Per daughter at bedside, LBM about 4 days ago.  Objective: Vitals:   01/01/20 0738 01/01/20 0742 01/01/20 0745 01/01/20 1250  BP:    127/70  Pulse:    76  Resp:    18  Temp:    98.1 F (36.7 C)  TempSrc:    Oral  SpO2: 93% 93% 93% 93%    Intake/Output Summary (Last 24 hours) at 01/01/2020 1419 Last data filed at 01/01/2020 0300 Gross per 24 hour  Intake 753.51 ml  Output --  Net 753.51 ml   There were no vitals filed  for this visit.  Examination:  GENERAL: No apparent distress.  Nontoxic. HEENT: MMM.  Vision and hearing grossly intact.  NECK: Supple.  No apparent JVD.  RESP: 93% on 3 L.  No IWOB. Good air movement bilaterally. CVS:  RRR. Heart sounds normal.  ABD/GI/GU: Bowel sounds diminished.  Soft. Non tender.  MSK/EXT:  Moves extremities. No apparent deformity. No edema.  SKIN: no apparent skin lesion or wound NEURO: Awake, alert and oriented self and family.  Follows commands.  No apparent focal neuro deficit. PSYCH: Calm. Normal affect.   Procedures:  None  Assessment & Plan: Small bowel obstruction likely secondary to adhesion in patient with history of multiple bowel surgeries in 2014. -DG abdomen with persistent SBO this morning. -Remains n.p.o.  Except sips with meds.  -May need NG tube if he tolerates but defer this to general surgery. -General surgery following -Continue IV PPI, IVF, as needed antiemetics  Left lower lobe pneumonia: CXR and CT abdomen and pelvis concerning for worsening LLL consolidation.  Patient and family denies new respiratory symptoms.  However, patient is not a reliable historian. -Would continue ceftriaxone and azithromycin -Encourage incentive spirometry  Chronic COPD/chronic respiratory failure with recent exacerbation: On 3 L at baseline. -Discontinued systemic steroid-should have about 5 days course now. -Continue breathing treatments-Dulera, Incruse Ellipta and as needed DuoNeb -Encourage incentive spirometry  Paroxysmal A. fib with RVR: RVR resolved.  On Cardizem and Pradaxa  at home. -Continue home Cardizem. -Continue holding Pradaxa  Essential hypertension: Normotensive -Continue home Cardizem -Hold lisinopril and HCTZ.  Dementia without behavioral disturbance: Stable. -Frequent reorientation and fall and delirium precautions. -Continue home memantine.  Chronic pain-on Norco and gabapentin at home. -Continue home  medications.  Hyperlipidemia -Continue home statin.  BPH: On Proscar at home. -Continue home Proscar. -Monitor INR.                DVT prophylaxis: Subcu Lovenox. Code Status: DNR/DNI Family Communication: Updated patient's wife, daughter and granddaughter at bedside.  Discharge barrier: Small bowel obstruction Patient is from: Home. Final disposition: To be determined.  Consultants: General surgery   Microbiology summarized: [id number] negative. Blood cultures pending.  Sch Meds:  Scheduled Meds: . atorvastatin  40 mg Oral Daily  . diltiazem  240 mg Oral BID  . enoxaparin (LOVENOX) injection  40 mg Subcutaneous Q24H  . finasteride  5 mg Oral Daily  . gabapentin  300 mg Oral Daily  . gabapentin  600 mg Oral QHS  . guaiFENesin  600 mg Oral BID  . ipratropium-albuterol  3 mL Nebulization QID  . loratadine  10 mg Oral Daily  . memantine  10 mg Oral BID  . methylPREDNISolone (SOLU-MEDROL) injection  40 mg Intravenous Daily  . mometasone-formoterol  2 puff Inhalation BID  . pantoprazole (PROTONIX) IV  40 mg Intravenous Q24H  . umeclidinium bromide  1 puff Inhalation Daily   Continuous Infusions: . 0.9 % NaCl with KCl 20 mEq / L 100 mL/hr at 01/01/20 5916  . azithromycin 500 mg (12/31/19 2316)  . cefTRIAXone (ROCEPHIN)  IV 2 g (12/31/19 2311)   PRN Meds:.acetaminophen **OR** acetaminophen, fluticasone, HYDROcodone-acetaminophen, magnesium hydroxide, ondansetron **OR** ondansetron (ZOFRAN) IV, traZODone  Antimicrobials: Anti-infectives (From admission, onward)   Start     Dose/Rate Route Frequency Ordered Stop   12/31/19 2230  cefTRIAXone (ROCEPHIN) 2 g in sodium chloride 0.9 % 100 mL IVPB     2 g 200 mL/hr over 30 Minutes Intravenous Every 24 hours 12/31/19 2219     12/31/19 2230  azithromycin (ZITHROMAX) 500 mg in sodium chloride 0.9 % 250 mL IVPB     500 mg 250 mL/hr over 60 Minutes Intravenous Every 24 hours 12/31/19 2219         I have personally  reviewed the following labs and images: CBC: Recent Labs  Lab 12/26/19 2343 12/27/19 0520 12/28/19 0125 12/31/19 1736 01/01/20 0306  WBC 11.5*  --  13.3* 15.3* 12.9*  NEUTROABS  --   --  11.6*  --   --   HGB 13.5 12.9* 12.8* 14.2 12.5*  HCT 40.7 38.0* 38.6* 43.4 38.4*  MCV 91.3  --  92.6 92.3 92.8  PLT 265  --  261 310 281   BMP &GFR Recent Labs  Lab 12/26/19 2343 12/27/19 0520 12/28/19 0125 12/31/19 1736 12/31/19 2252 01/01/20 0306  NA 133* 134* 137 138  --  137  K 3.5 3.4* 4.4 3.5  --  3.9  CL 92*  --  100 89*  --  94*  CO2 27  --  24 33*  --  31  GLUCOSE 118*  --  142* 136*  --  128*  BUN 20  --  11 33*  --  32*  CREATININE 1.26*  --  1.10 1.14  --  1.11  CALCIUM 8.9  --  8.8* 9.5  --  8.8*  MG  --   --  1.7  --  2.0  --    Estimated Creatinine Clearance: 74 mL/min (by C-G formula based on SCr of 1.11 mg/dL). Liver & Pancreas: Recent Labs  Lab 12/28/19 0125 12/31/19 1736  AST 24 21  ALT 15 17  ALKPHOS 83 67  BILITOT 0.8 1.2  PROT 6.5 6.7  ALBUMIN 3.4* 3.4*   No results for input(s): LIPASE, AMYLASE in the last 168 hours. No results for input(s): AMMONIA in the last 168 hours. Diabetic: No results for input(s): HGBA1C in the last 72 hours. No results for input(s): GLUCAP in the last 168 hours. Cardiac Enzymes: No results for input(s): CKTOTAL, CKMB, CKMBINDEX, TROPONINI in the last 168 hours. No results for input(s): PROBNP in the last 8760 hours. Coagulation Profile: Recent Labs  Lab 12/31/19 1736  INR 1.1   Thyroid Function Tests: No results for input(s): TSH, T4TOTAL, FREET4, T3FREE, THYROIDAB in the last 72 hours. Lipid Profile: No results for input(s): CHOL, HDL, LDLCALC, TRIG, CHOLHDL, LDLDIRECT in the last 72 hours. Anemia Panel: No results for input(s): VITAMINB12, FOLATE, FERRITIN, TIBC, IRON, RETICCTPCT in the last 72 hours. Urine analysis:    Component Value Date/Time   COLORURINE RED (A) 11/12/2012 1529   APPEARANCEUR HAZY (A)  11/12/2012 1529   LABSPEC 1.009 11/12/2012 1529   PHURINE 6.5 11/12/2012 1529   GLUCOSEU NEGATIVE 11/12/2012 1529   HGBUR LARGE (A) 11/12/2012 1529   BILIRUBINUR NEGATIVE 11/12/2012 1529   KETONESUR NEGATIVE 11/12/2012 1529   PROTEINUR NEGATIVE 11/12/2012 1529   UROBILINOGEN 0.2 11/12/2012 1529   NITRITE NEGATIVE 11/12/2012 1529   LEUKOCYTESUR NEGATIVE 11/12/2012 1529   Sepsis Labs: Invalid input(s): PROCALCITONIN, [place]  Microbiology: Recent Results (from the past 240 hour(s))  SARS CORONAVIRUS 2 (TAT 6-24 HRS) Nasopharyngeal Nasopharyngeal Swab     Status: None   Collection Time: 12/27/19  2:58 AM   Specimen: Nasopharyngeal Swab  Result Value Ref Range Status   SARS Coronavirus 2 NEGATIVE NEGATIVE Final    Comment: (NOTE) SARS-CoV-2 target nucleic acids are NOT DETECTED. The SARS-CoV-2 RNA is generally detectable in upper and lower respiratory specimens during the acute phase of infection. Negative results do not preclude SARS-CoV-2 infection, do not rule out co-infections with other pathogens, and should not be used as the sole basis for treatment or other patient management decisions. Negative results must be combined with clinical observations, patient history, and epidemiological information. The expected result is Negative. Fact Sheet for Patients: SugarRoll.be Fact Sheet for Healthcare Providers: https://www.woods-mathews.com/ This test is not yet approved or cleared by the [place] FDA and  has been authorized for detection and/or diagnosis of SARS-CoV-2 by FDA under an Emergency Use Authorization (EUA). This EUA will remain  in effect (meaning this test can be used) for the duration of the COVID-19 declaration under Section 56 4(b)(1) of the Act, 21 U.S.C. section 360bbb-3(b)(1), unless the authorization is terminated or revoked sooner. Performed at [facility], [place] [address], [place], [place] [address]    Culture, blood (routine x 2)     Status: None (Preliminary result)   Collection Time: 12/27/19  4:22 AM   Specimen: BLOOD  Result Value Ref Range Status   Specimen Description BLOOD RIGHT ARM  Final   Special Requests   Final    BOTTLES DRAWN AEROBIC AND ANAEROBIC Blood Culture adequate volume   Culture   Final    NO GROWTH 4 DAYS Performed at [facility], [place] [address], [place], [place] [address]    Report Status PENDING  Incomplete  Culture, blood (routine x 2)     Status: None (Preliminary result)   Collection Time: 12/27/19  4:22 AM   Specimen: BLOOD  Result Value Ref Range Status   Specimen Description BLOOD RIGHT HAND  Final   Special Requests   Final    BOTTLES DRAWN AEROBIC ONLY Blood Culture adequate volume   Culture   Final    NO GROWTH 4 DAYS Performed at [facility], [address] [place], [place] [address]    Report Status PENDING  Incomplete  SARS CORONAVIRUS 2 (TAT 6-24 HRS) Nasopharyngeal Nasopharyngeal Swab     Status: None   Collection Time: 12/31/19  9:31 PM   Specimen: Nasopharyngeal Swab  Result Value Ref Range Status   SARS Coronavirus 2 NEGATIVE NEGATIVE Final    Comment: (NOTE) SARS-CoV-2 target nucleic acids are NOT DETECTED. The SARS-CoV-2 RNA is generally detectable in upper and lower respiratory specimens during the acute phase of infection. Negative results do not preclude SARS-CoV-2 infection, do not rule out co-infections with other pathogens, and should not be used as the sole basis for treatment or other patient management decisions. Negative results must be combined with clinical observations, patient history, and epidemiological information. The expected result is Negative. Fact Sheet for Patients: SugarRoll.be Fact Sheet for Healthcare Providers: https://www.woods-mathews.com/ This test is not yet approved or cleared by the [place] FDA and  has been authorized for detection  and/or diagnosis of SARS-CoV-2 by FDA under an Emergency Use Authorization (EUA). This EUA will remain  in effect (meaning this test can be used) for the duration of the COVID-19 declaration under Section 56 4(b)(1) of the Act, 21 U.S.C. section 360bbb-3(b)(1), unless the authorization is terminated or revoked sooner. Performed at [facility], [place] [address] [place], [place] [address]     Radiology Studies: CT ABDOMEN PELVIS W CONTRAST  Result Date: 12/31/2019 CLINICAL DATA:  Abdominal pain and distension with coffee ground emesis EXAM: CT ABDOMEN AND PELVIS WITH CONTRAST TECHNIQUE: Multidetector CT imaging of the abdomen and pelvis was performed using the standard protocol following bolus administration of intravenous contrast. CONTRAST:  169mL OMNIPAQUE IOHEXOL 300 MG/ML  SOLN COMPARISON:  01/30/2016 CT of the abdomen, 12/27/2019, CT of the chest FINDINGS: Lower chest: Right lung base is within normal limits. Left lower lobe consolidation is noted increased from the prior exam of 4 days previous Hepatobiliary: No focal liver abnormality is seen. Status post cholecystectomy. No biliary dilatation. Pancreas: Unremarkable. No pancreatic ductal dilatation or surrounding inflammatory changes. Spleen: Multiple calcified granulomas are noted within the spleen stable from the prior exam. Adrenals/Urinary Tract: Adrenal glands are within normal limits. The kidneys demonstrate a normal enhancement pattern bilaterally. Some scarring in the left kidney is seen. Normal excretion of contrast is noted. The bladder is well distended. Stomach/Bowel: Colon shows no obstructive or inflammatory changes. Changes of prior colonic resection are seen. The appendix is within normal limits. Stomach is distended with fluid as is the proximal jejunum. Multiple dilated loops of proximal small bowel are noted. There is a transition zone noted in the left mid abdomen best seen on images 39 through 41 of series 3 and image  56 of series 6. Given the patient's clinical history of prior colostomy and reversal these changes are likely related to adhesions. The more distal small bowel appears within normal limits. Vascular/Lymphatic: Aortic atherosclerosis. No enlarged abdominal or pelvic lymph nodes. Reproductive: Prostate is unremarkable. Other: No abdominal wall hernia or abnormality. No  abdominopelvic ascites. Musculoskeletal: Degenerative changes of lumbar spine are noted. IMPRESSION: Small-bowel obstruction with a transition point in the left mid abdomen likely related underlying adhesions. Changes of prior colostomy reversal. Left lower lobe consolidation increased from the prior exam. Electronically Signed   By: Inez Catalina M.D.   On: 12/31/2019 20:41   DG Chest Portable 1 View  Result Date: 12/31/2019 CLINICAL DATA:  Cough EXAM: PORTABLE CHEST 1 VIEW COMPARISON:  12/27/2019, CT chest 12/27/2019 FINDINGS: Emphysematous disease. Increased opacity at the left base since prior. Possible small left effusion. Post lobectomy changes on the right. Right apical opacity corresponding to CT soft tissue density. Stable cardiomediastinal silhouette. No pneumothorax. IMPRESSION: 1. Hyperinflated lungs with emphysematous disease and postsurgical changes on the right. Right apical soft tissue abnormality is better demonstrated on the recent chest CT. 2. Increased atelectasis or mild pneumonia at the left base with possible small left effusion. Electronically Signed   By: Donavan Foil M.D.   On: 12/31/2019 18:20   DG Abd 2 Views  Result Date: 01/01/2020 CLINICAL DATA:  Small-bowel obstruction. EXAM: ABDOMEN - 2 VIEW COMPARISON:  CT abdomen/pelvis 12/31/2019 FINDINGS: Persistently dilated small bowel loops measuring up to 6.3 cm in diameter. Multiple small bowel air-fluid levels are demonstrated on upright radiographs. No evidence of free air. Moderate stool burden within the right colon. Right common iliac artery stent. Contrast opacifies  the urinary bladder. Surgical clips within the right upper quadrant of the abdomen. IMPRESSION: Findings consistent with persistent small-bowel obstruction. No evidence of free air. Electronically Signed   By: Kellie Simmering DO   On: 01/01/2020 08:31   45 minutes with more than 50% spent in reviewing records, counseling patient/family and coordinating care.   Mostyn Varnell T. Clarence  If 7PM-7AM, please contact night-coverage [email or web address] Password [facility] 01/01/2020, 2:19 PM

## 2020-01-02 ENCOUNTER — Inpatient Hospital Stay (HOSPITAL_COMMUNITY): Payer: Non-veteran care

## 2020-01-02 DIAGNOSIS — R338 Other retention of urine: Secondary | ICD-10-CM

## 2020-01-02 DIAGNOSIS — D649 Anemia, unspecified: Secondary | ICD-10-CM

## 2020-01-02 LAB — RENAL FUNCTION PANEL
Albumin: 2.5 g/dL — ABNORMAL LOW (ref 3.5–5.0)
Anion gap: 11 (ref 5–15)
BUN: 33 mg/dL — ABNORMAL HIGH (ref 8–23)
CO2: 30 mmol/L (ref 22–32)
Calcium: 8.6 mg/dL — ABNORMAL LOW (ref 8.9–10.3)
Chloride: 104 mmol/L (ref 98–111)
Creatinine, Ser: 0.99 mg/dL (ref 0.61–1.24)
GFR calc Af Amer: >60 mL/min (ref >60)
GFR calc non Af Amer: >60 mL/min (ref >60)
Glucose, Bld: 151 mg/dL — ABNORMAL HIGH (ref 70–99)
Phosphorus: 3.7 mg/dL (ref 2.5–4.6)
Potassium: 3.7 mmol/L (ref 3.5–5.1)
Sodium: 145 mmol/L (ref 135–145)

## 2020-01-02 LAB — CBC
HCT: 33.2 % — ABNORMAL LOW (ref 39.0–52.0)
Hemoglobin: 10.7 g/dL — ABNORMAL LOW (ref 13.0–17.0)
MCH: 30.3 pg (ref 26.0–34.0)
MCHC: 32.2 g/dL (ref 30.0–36.0)
MCV: 94.1 fL (ref 80.0–100.0)
Platelets: 245 10*3/uL (ref 150–400)
RBC: 3.53 MIL/uL — ABNORMAL LOW (ref 4.22–5.81)
RDW: 13.8 % (ref 11.5–15.5)
WBC: 12.9 10*3/uL — ABNORMAL HIGH (ref 4.0–10.5)
nRBC: 0 % (ref 0.0–0.2)

## 2020-01-02 LAB — MAGNESIUM: Magnesium: 2 mg/dL (ref 1.7–2.4)

## 2020-01-02 MED ORDER — HALOPERIDOL LACTATE 5 MG/ML IJ SOLN: 2.0000 mg | INTRAMUSCULAR | Status: DC | PRN

## 2020-01-02 MED ORDER — CHLORHEXIDINE GLUCONATE CLOTH 2 % EX PADS
6.0000 | MEDICATED_PAD | Freq: Every day | CUTANEOUS | Status: DC
  Administered 2020-01-02 – 2020-01-05 (×4): 6 via TOPICAL

## 2020-01-02 MED ORDER — IPRATROPIUM-ALBUTEROL 0.5-2.5 (3) MG/3ML IN SOLN
3.0000 mL | Freq: Three times a day (TID) | RESPIRATORY_TRACT | Status: DC
  Administered 2020-01-02 – 2020-01-03 (×3): 3 mL via RESPIRATORY_TRACT
  Filled 2020-01-02 (×3): qty 3

## 2020-01-02 MED ORDER — IPRATROPIUM-ALBUTEROL 0.5-2.5 (3) MG/3ML IN SOLN: 3.0000 mL | RESPIRATORY_TRACT | Status: DC | PRN

## 2020-01-02 MED ORDER — HALOPERIDOL 1 MG PO TABS
2.0000 mg | ORAL_TABLET | ORAL | Status: DC | PRN
  Filled 2020-01-02: qty 2

## 2020-01-02 NOTE — Progress Notes (Signed)
Assessment & Plan: HD#7 - small bowel obstruction  NG placed yesterday and small bowel protocol initiated  AXR this AM with contrast in small bowel, persistent dilatation  Continue NPO, IV hydration, NG decompression  Consider TNA  Will repeat AXR in AM 4/4  Will follow  Discussed management of SBO with wife and reviewed this morning's AXR results.        James Gemma, MD       [facility]       Office: [phone]   Chief Complaint: SBO  Subjective: Patient in bed, comfortable, no complaints.  No flatus or BM's.  Objective: Vital signs in last 24 hours: Temp:  [97.4 F (36.3 C)-98.1 F (36.7 C)] 97.4 F (36.3 C) (04/03 0652) Pulse Rate:  [73-76] 73 (04/03 0652) Resp:  [16-18] 18 (04/03 0652) BP: (114-127)/(70-77) 118/77 (04/03 0652) SpO2:  [93 %] 93 % (04/03 0709) Last BM Date: 12/28/19  Intake/Output from previous day: 04/02 0701 - 04/03 0700 In: 1084.7 [I.V.:734.7; IV Piggyback:350] Out: 2000 [Urine:1450; Emesis/NG output:550] Intake/Output this shift: No intake/output data recorded.  Physical Exam: HEENT - sclerae clear, mucous membranes moist Neck - soft Chest - clear bilaterally Cor - RRR Abdomen - soft, mild distension; quiet; non-tender; multiple surgical incisions; no obvious hernia Ext - no edema, non-tender   Lab Results:  Recent Labs    01/01/20 0306 01/02/20 0412  WBC 12.9* 12.9*  HGB 12.5* 10.7*  HCT 38.4* 33.2*  PLT 281 245   BMET Recent Labs    01/01/20 0306 01/02/20 0412  NA 137 145  K 3.9 3.7  CL 94* 104  CO2 31 30  GLUCOSE 128* 151*  BUN 32* 33*  CREATININE 1.11 0.99  CALCIUM 8.8* 8.6*   PT/INR Recent Labs    12/31/19 1736  LABPROT 14.5  INR 1.1   Comprehensive Metabolic Panel:    Component Value Date/Time   NA 145 01/02/2020 0412   NA 137 01/01/2020 0306   K 3.7 01/02/2020 0412   K 3.9 01/01/2020 0306   CL 104 01/02/2020 0412   CL 94 (L) 01/01/2020 0306   CO2 30 01/02/2020 0412    CO2 31 01/01/2020 0306   BUN 33 (H) 01/02/2020 0412   BUN 32 (H) 01/01/2020 0306   CREATININE 0.99 01/02/2020 0412   CREATININE 1.11 01/01/2020 0306   GLUCOSE 151 (H) 01/02/2020 0412   GLUCOSE 128 (H) 01/01/2020 0306   CALCIUM 8.6 (L) 01/02/2020 0412   CALCIUM 8.8 (L) 01/01/2020 0306   AST 21 12/31/2019 1736   AST 24 12/28/2019 0125   ALT 17 12/31/2019 1736   ALT 15 12/28/2019 0125   ALKPHOS 67 12/31/2019 1736   ALKPHOS 83 12/28/2019 0125   BILITOT 1.2 12/31/2019 1736   BILITOT 0.8 12/28/2019 0125   PROT 6.7 12/31/2019 1736   PROT 6.5 12/28/2019 0125   ALBUMIN 2.5 (L) 01/02/2020 0412   ALBUMIN 3.4 (L) 12/31/2019 1736    Studies/Results: CT ABDOMEN PELVIS W CONTRAST  Result Date: 12/31/2019 CLINICAL DATA:  Abdominal pain and distension with coffee ground emesis EXAM: CT ABDOMEN AND PELVIS WITH CONTRAST TECHNIQUE: Multidetector CT imaging of the abdomen and pelvis was performed using the standard protocol following bolus administration of intravenous contrast. CONTRAST:  112mL OMNIPAQUE IOHEXOL 300 MG/ML  SOLN COMPARISON:  01/30/2016 CT of the abdomen, 12/27/2019, CT of the chest FINDINGS: Lower chest: Right lung base is within normal limits. Left lower lobe consolidation is noted increased from the prior  exam of 4 days previous Hepatobiliary: No focal liver abnormality is seen. Status post cholecystectomy. No biliary dilatation. Pancreas: Unremarkable. No pancreatic ductal dilatation or surrounding inflammatory changes. Spleen: Multiple calcified granulomas are noted within the spleen stable from the prior exam. Adrenals/Urinary Tract: Adrenal glands are within normal limits. The kidneys demonstrate a normal enhancement pattern bilaterally. Some scarring in the left kidney is seen. Normal excretion of contrast is noted. The bladder is well distended. Stomach/Bowel: Colon shows no obstructive or inflammatory changes. Changes of prior colonic resection are seen. The appendix is within normal  limits. Stomach is distended with fluid as is the proximal jejunum. Multiple dilated loops of proximal small bowel are noted. There is a transition zone noted in the left mid abdomen best seen on images 39 through 41 of series 3 and image 56 of series 6. Given the patient's clinical history of prior colostomy and reversal these changes are likely related to adhesions. The more distal small bowel appears within normal limits. Vascular/Lymphatic: Aortic atherosclerosis. No enlarged abdominal or pelvic lymph nodes. Reproductive: Prostate is unremarkable. Other: No abdominal wall hernia or abnormality. No abdominopelvic ascites. Musculoskeletal: Degenerative changes of lumbar spine are noted. IMPRESSION: Small-bowel obstruction with a transition point in the left mid abdomen likely related underlying adhesions. Changes of prior colostomy reversal. Left lower lobe consolidation increased from the prior exam. Electronically Signed   By: Inez Catalina M.D.   On: 12/31/2019 20:41   DG Chest Portable 1 View  Result Date: 12/31/2019 CLINICAL DATA:  Cough EXAM: PORTABLE CHEST 1 VIEW COMPARISON:  12/27/2019, CT chest 12/27/2019 FINDINGS: Emphysematous disease. Increased opacity at the left base since prior. Possible small left effusion. Post lobectomy changes on the right. Right apical opacity corresponding to CT soft tissue density. Stable cardiomediastinal silhouette. No pneumothorax. IMPRESSION: 1. Hyperinflated lungs with emphysematous disease and postsurgical changes on the right. Right apical soft tissue abnormality is better demonstrated on the recent chest CT. 2. Increased atelectasis or mild pneumonia at the left base with possible small left effusion. Electronically Signed   By: Donavan Foil M.D.   On: 12/31/2019 18:20   DG Abd 2 Views  Result Date: 01/01/2020 CLINICAL DATA:  Small-bowel obstruction. EXAM: ABDOMEN - 2 VIEW COMPARISON:  CT abdomen/pelvis 12/31/2019 FINDINGS: Persistently dilated small bowel loops  measuring up to 6.3 cm in diameter. Multiple small bowel air-fluid levels are demonstrated on upright radiographs. No evidence of free air. Moderate stool burden within the right colon. Right common iliac artery stent. Contrast opacifies the urinary bladder. Surgical clips within the right upper quadrant of the abdomen. IMPRESSION: Findings consistent with persistent small-bowel obstruction. No evidence of free air. Electronically Signed   By: Kellie Simmering DO   On: 01/01/2020 08:31   DG Abd Portable 1V-Small Bowel Obstruction Protocol-initial, 8 hr delay  Result Date: 01/02/2020 CLINICAL DATA:  Bowel obstruction. EXAM: PORTABLE ABDOMEN - 1 VIEW COMPARISON:  01/01/2020 FINDINGS: The enteric tube projects over the left upper quadrant. Multiple dilated loops of small bowel are again noted throughout the abdomen. The majority of the oral contrast remains in the small bowel. There is no convincing oral contrast within the colon. There is a large amount of stool in the ascending colon. There is no definite pneumoperitoneum. No pneumatosis. IMPRESSION: Multiple dilated loops of small bowel throughout the abdomen. The majority of the oral contrast remains in the small bowel. Electronically Signed   By: Constance Holster M.D.   On: 01/02/2020 03:25   DG Abd Portable  1V-Small Bowel Protocol-Position Verification  Result Date: 01/01/2020 CLINICAL DATA:  NG tube placement EXAM: PORTABLE ABDOMEN - 1 VIEW COMPARISON:  Abdominal radiographs, 01/01/2020 FINDINGS: Esophagogastric tube is positioned with tip and side port below the diaphragm. Redemonstrated gas-filled loops of bowel in the included upper abdomen. No free air. IMPRESSION: Esophagogastric tube with tip and side port below the diaphragm. Electronically Signed   By: Eddie Candle M.D.   On: 01/01/2020 17:49      James [name] 01/02/2020  Patient ID: Cherrie Gauze, male   DOB: 21-Jul-1951, 69 y.o.   MRN: [id number]

## 2020-01-02 NOTE — Progress Notes (Signed)
PROGRESS NOTE  James BUNNER ZWC:[id number] DOB: May 19, 1951   PCP: Ann Maki, MD  Patient is from: Home.  DOA: 12/31/2019 LOS: 2  Brief Narrative / Interim history: 69 year old male with history of COPD/chronic RF on 2 to 3 L, PAF on Pradaxa, dementia, lung cancer status post lobectomy, AAA, multiple abdominal surgery (laparotomy, colostomy and colostomy reversal in 2014) and recent hospitalization for COPD exacerbation from 3/27-3/30 presenting with nausea, vomiting and abdominal pain, and admitted for small bowel obstruction and possible pneumonia.  Last bowel movements about 4 days prior to admission.  In ED, in A. fib with mild RVR to 120.  WBC 15.3. Cr 1.14 (about baseline).  BUN 33.  K3.5.  AG 16.  CBC and CMP suggestive for dehydration.  EKG with A. fib and RVR to 120s.  FOBT negative.  COVID-19 negative.  CXR with LLL opacity and emphysematous changes. CT abdomen and pelvis revealed SBO with transition point in left mid abdomen likely related to underlying adhesions, and increased LLL consolidation. Patient was started on IV fluid, ceftriaxone, azithromycin and antiemetics, and admitted for SBO and LLL pneumonia..  General surgery consulted and following  Subjective: Seen and examined earlier this morning.  Patient had acute urinary retention.  Had over 1 L urine output on I&O.  Foley catheter inserted.  No complaint this morning.  He denies chest pain, dyspnea, abdominal pain or back pain.  However, patient is not a reliable historian due to advanced dementia.  Daughter and wife at bedside.  Objective: Vitals:   01/01/20 1557 01/01/20 2136 01/02/20 0652 01/02/20 0709  BP:  114/70 118/77   Pulse:  74 73   Resp:  16 18   Temp:  97.8 F (36.6 C) (!) 97.4 F (36.3 C)   TempSrc:  Oral Oral   SpO2: 93%   93%    Intake/Output Summary (Last 24 hours) at 01/02/2020 1242 Last data filed at 01/02/2020 0900 Gross per 24 hour  Intake 1084.66 ml  Output 2000 ml  Net -915.34 ml    There were no vitals filed for this visit.  Examination:  GENERAL: No apparent distress.  Nontoxic. HEENT: MMM.  NG tube to wall suction.  About 550 cc / 12 hours. NECK: Supple.  No apparent JVD.  RESP: 93% on 3 L.  No IWOB.  Fair aeration bilaterally. CVS:  RRR. Heart sounds normal.  ABD/GI/GU: Bowel sounds present. Soft. Non tender.  MSK/EXT:  Moves extremities. No apparent deformity. No edema.  SKIN: no apparent skin lesion or wound NEURO: Awake, alert and oriented self and family..  No apparent focal neuro deficit. PSYCH: Calm. Normal affect  Procedures:  None  Assessment & Plan: SBO likely due to adhesion in patient with history of multiple bowel surgeries in 2014. -General surgery following. -DG abdomen with persistent SBO this morning. -Continue NG tube decompression, IVF, PPI, and n.p.o.  Left lower lobe pneumonia: CXR and CT abdomen and pelvis concerning for worsening LLL consolidation.  Patient and family denies new respiratory symptoms.  However, patient is not a reliable historian. -Would continue ceftriaxone and azithromycin empirically. -Encourage incentive spirometry  Chronic COPD/chronic respiratory failure with recent exacerbation: On 3 L at baseline. -Discontinued systemic steroid-completed 5 days course became previous admission and this admission. -Continue breathing treatments-Dulera, Incruse Ellipta and as needed DuoNeb -Encourage incentive spirometry  Paroxysmal A. fib with RVR: RVR resolved.  On Cardizem and Pradaxa at home. -IV metoprolol 2.5 mg every 8 hours -Continue holding home medications while NPO.  Essential hypertension: Normotensive -IV metoprolol as above. -Continue holding home medications while NPO.  Dementia without behavioral disturbance: Stable. -Frequent reorientation and fall and delirium precautions. -Haldol 2 mg IM as needed agitation  Acute urinary retention with bladder over distention: Had about 1000 cc on I&O cath the  night of 4/2.  Has history of BPH. -Continue indwelling Foley at least for 1 week -May need outpatient urology follow-up for voiding trial.  Chronic pain-on Norco and gabapentin at home. -Fentanyl as needed.  Hyperlipidemia -Continue home statin.  BPH: On Proscar at home. -Now with indwelling Foley  Normocytic anemia: H&H stable. -Check anemia panel in the morning                DVT prophylaxis: Subcu Lovenox. Code Status: DNR/DNI Family Communication: Updated patient's wife and daughter at bedside.  Discharge barrier: N.p.o. due to small bowel obstruction requiring NG tube and IV fluids Patient is from: Home. Final disposition: To be determined.  Consultants: General surgery   Microbiology summarized: [id number] negative. Blood cultures pending.  Sch Meds:  Scheduled Meds: . atorvastatin  40 mg Oral Daily  . Chlorhexidine Gluconate Cloth  6 each Topical Daily  . enoxaparin (LOVENOX) injection  40 mg Subcutaneous Q24H  . ipratropium-albuterol  3 mL Nebulization TID  . memantine  10 mg Oral BID  . metoprolol tartrate  2.5 mg Intravenous Q6H  . mometasone-formoterol  2 puff Inhalation BID  . pantoprazole (PROTONIX) IV  40 mg Intravenous Q24H  . umeclidinium bromide  1 puff Inhalation Daily   Continuous Infusions: . azithromycin 500 mg (01/01/20 2117)  . cefTRIAXone (ROCEPHIN)  IV 2 g (01/01/20 2119)  . dextrose 5 % and 0.9 % NaCl with KCl 20 mEq/L 100 mL/hr at 01/02/20 0529   PRN Meds:.acetaminophen **OR** acetaminophen, fentaNYL (SUBLIMAZE) injection, fluticasone, haloperidol **OR** haloperidol lactate, ipratropium-albuterol, magnesium hydroxide, ondansetron **OR** ondansetron (ZOFRAN) IV, phenol  Antimicrobials: Anti-infectives (From admission, onward)   Start     Dose/Rate Route Frequency Ordered Stop   12/31/19 2230  cefTRIAXone (ROCEPHIN) 2 g in sodium chloride 0.9 % 100 mL IVPB     2 g 200 mL/hr over 30 Minutes Intravenous Every 24 hours 12/31/19 2219      12/31/19 2230  azithromycin (ZITHROMAX) 500 mg in sodium chloride 0.9 % 250 mL IVPB     500 mg 250 mL/hr over 60 Minutes Intravenous Every 24 hours 12/31/19 2219         I have personally reviewed the following labs and images: CBC: Recent Labs  Lab 12/26/19 2343 12/26/19 2343 12/27/19 0520 12/28/19 0125 12/31/19 1736 01/01/20 0306 01/02/20 0412  WBC 11.5*  --   --  13.3* 15.3* 12.9* 12.9*  NEUTROABS  --   --   --  11.6*  --   --   --   HGB 13.5   < > 12.9* 12.8* 14.2 12.5* 10.7*  HCT 40.7   < > 38.0* 38.6* 43.4 38.4* 33.2*  MCV 91.3  --   --  92.6 92.3 92.8 94.1  PLT 265  --   --  261 310 281 245   < > = values in this interval not displayed.   BMP &GFR Recent Labs  Lab 12/26/19 2343 12/26/19 2343 12/27/19 0520 12/28/19 0125 12/31/19 1736 12/31/19 2252 01/01/20 0306 01/02/20 0412  NA 133*   < > 134* 137 138  --  137 145  K 3.5   < > 3.4* 4.4 3.5  --  3.9 3.7  CL 92*  --   --  100 89*  --  94* 104  CO2 27  --   --  24 33*  --  31 30  GLUCOSE 118*  --   --  142* 136*  --  128* 151*  BUN 20  --   --  11 33*  --  32* 33*  CREATININE 1.26*  --   --  1.10 1.14  --  1.11 0.99  CALCIUM 8.9  --   --  8.8* 9.5  --  8.8* 8.6*  MG  --   --   --  1.7  --  2.0  --  2.0  PHOS  --   --   --   --   --   --   --  3.7   < > = values in this interval not displayed.   Estimated Creatinine Clearance: 83 mL/min (by C-G formula based on SCr of 0.99 mg/dL). Liver & Pancreas: Recent Labs  Lab 12/28/19 0125 12/31/19 1736 01/02/20 0412  AST 24 21  --   ALT 15 17  --   ALKPHOS 83 67  --   BILITOT 0.8 1.2  --   PROT 6.5 6.7  --   ALBUMIN 3.4* 3.4* 2.5*   No results for input(s): LIPASE, AMYLASE in the last 168 hours. No results for input(s): AMMONIA in the last 168 hours. Diabetic: No results for input(s): HGBA1C in the last 72 hours. No results for input(s): GLUCAP in the last 168 hours. Cardiac Enzymes: No results for input(s): CKTOTAL, CKMB, CKMBINDEX, TROPONINI in the  last 168 hours. No results for input(s): PROBNP in the last 8760 hours. Coagulation Profile: Recent Labs  Lab 12/31/19 1736  INR 1.1   Thyroid Function Tests: No results for input(s): TSH, T4TOTAL, FREET4, T3FREE, THYROIDAB in the last 72 hours. Lipid Profile: No results for input(s): CHOL, HDL, LDLCALC, TRIG, CHOLHDL, LDLDIRECT in the last 72 hours. Anemia Panel: No results for input(s): VITAMINB12, FOLATE, FERRITIN, TIBC, IRON, RETICCTPCT in the last 72 hours. Urine analysis:    Component Value Date/Time   COLORURINE RED (A) 11/12/2012 1529   APPEARANCEUR HAZY (A) 11/12/2012 1529   LABSPEC 1.009 11/12/2012 1529   PHURINE 6.5 11/12/2012 1529   GLUCOSEU NEGATIVE 11/12/2012 1529   HGBUR LARGE (A) 11/12/2012 1529   BILIRUBINUR NEGATIVE 11/12/2012 1529   KETONESUR NEGATIVE 11/12/2012 1529   PROTEINUR NEGATIVE 11/12/2012 1529   UROBILINOGEN 0.2 11/12/2012 1529   NITRITE NEGATIVE 11/12/2012 1529   LEUKOCYTESUR NEGATIVE 11/12/2012 1529   Sepsis Labs: Invalid input(s): PROCALCITONIN, [place]  Microbiology: Recent Results (from the past 240 hour(s))  SARS CORONAVIRUS 2 (TAT 6-24 HRS) Nasopharyngeal Nasopharyngeal Swab     Status: None   Collection Time: 12/27/19  2:58 AM   Specimen: Nasopharyngeal Swab  Result Value Ref Range Status   SARS Coronavirus 2 NEGATIVE NEGATIVE Final    Comment: (NOTE) SARS-CoV-2 target nucleic acids are NOT DETECTED. The SARS-CoV-2 RNA is generally detectable in upper and lower respiratory specimens during the acute phase of infection. Negative results do not preclude SARS-CoV-2 infection, do not rule out co-infections with other pathogens, and should not be used as the sole basis for treatment or other patient management decisions. Negative results must be combined with clinical observations, patient history, and epidemiological information. The expected result is Negative. Fact Sheet for  Patients: SugarRoll.be Fact Sheet for Healthcare Providers: https://www.woods-mathews.com/ This test is not yet approved or cleared by the [place] FDA  and  has been authorized for detection and/or diagnosis of SARS-CoV-2 by FDA under an Emergency Use Authorization (EUA). This EUA will remain  in effect (meaning this test can be used) for the duration of the COVID-19 declaration under Section 56 4(b)(1) of the Act, 21 U.S.C. section 360bbb-3(b)(1), unless the authorization is terminated or revoked sooner. Performed at [facility], [place] [address], [place], [place] [address]   Culture, blood (routine x 2)     Status: None   Collection Time: 12/27/19  4:22 AM   Specimen: BLOOD  Result Value Ref Range Status   Specimen Description BLOOD RIGHT ARM  Final   Special Requests   Final    BOTTLES DRAWN AEROBIC AND ANAEROBIC Blood Culture adequate volume   Culture   Final    NO GROWTH 5 DAYS Performed at [facility], [address], [place], [place] [address]    Report Status 01/01/2020 FINAL  Final  Culture, blood (routine x 2)     Status: None   Collection Time: 12/27/19  4:22 AM   Specimen: BLOOD  Result Value Ref Range Status   Specimen Description BLOOD RIGHT HAND  Final   Special Requests   Final    BOTTLES DRAWN AEROBIC ONLY Blood Culture adequate volume   Culture   Final    NO GROWTH 5 DAYS Performed at [facility], [place] [address], [place], [place] [address]    Report Status 01/01/2020 FINAL  Final  SARS CORONAVIRUS 2 (TAT 6-24 HRS) Nasopharyngeal Nasopharyngeal Swab     Status: None   Collection Time: 12/31/19  9:31 PM   Specimen: Nasopharyngeal Swab  Result Value Ref Range Status   SARS Coronavirus 2 NEGATIVE NEGATIVE Final    Comment: (NOTE) SARS-CoV-2 target nucleic acids are NOT DETECTED. The SARS-CoV-2 RNA is generally detectable in upper and lower respiratory specimens during the acute phase of  infection. Negative results do not preclude SARS-CoV-2 infection, do not rule out co-infections with other pathogens, and should not be used as the sole basis for treatment or other patient management decisions. Negative results must be combined with clinical observations, patient history, and epidemiological information. The expected result is Negative. Fact Sheet for Patients: SugarRoll.be Fact Sheet for Healthcare Providers: https://www.woods-mathews.com/ This test is not yet approved or cleared by the [place] FDA and  has been authorized for detection and/or diagnosis of SARS-CoV-2 by FDA under an Emergency Use Authorization (EUA). This EUA will remain  in effect (meaning this test can be used) for the duration of the COVID-19 declaration under Section 56 4(b)(1) of the Act, 21 U.S.C. section 360bbb-3(b)(1), unless the authorization is terminated or revoked sooner. Performed at [facility], [place] [address], [place], [place] [address]     Radiology Studies: DG Abd Portable 1V-Small Bowel Obstruction Protocol-initial, 8 hr delay  Result Date: 01/02/2020 CLINICAL DATA:  Bowel obstruction. EXAM: PORTABLE ABDOMEN - 1 VIEW COMPARISON:  01/01/2020 FINDINGS: The enteric tube projects over the left upper quadrant. Multiple dilated loops of small bowel are again noted throughout the abdomen. The majority of the oral contrast remains in the small bowel. There is no convincing oral contrast within the colon. There is a large amount of stool in the ascending colon. There is no definite pneumoperitoneum. No pneumatosis. IMPRESSION: Multiple dilated loops of small bowel throughout the abdomen. The majority of the oral contrast remains in the small bowel. Electronically Signed   By: Jamie Kato.D.  On: 01/02/2020 03:25   DG Abd Portable 1V-Small Bowel Protocol-Position Verification  Result Date: 01/01/2020 CLINICAL DATA:  NG tube placement  EXAM: PORTABLE ABDOMEN - 1 VIEW COMPARISON:  Abdominal radiographs, 01/01/2020 FINDINGS: Esophagogastric tube is positioned with tip and side port below the diaphragm. Redemonstrated gas-filled loops of bowel in the included upper abdomen. No free air. IMPRESSION: Esophagogastric tube with tip and side port below the diaphragm. Electronically Signed   By: Eddie Candle M.D.   On: 01/01/2020 17:49    Tamerra Merkley T. [place]  If 7PM-7AM, please contact night-coverage [email or web address] Password [facility] 01/02/2020, 12:42 PM

## 2020-01-03 ENCOUNTER — Inpatient Hospital Stay (HOSPITAL_COMMUNITY): Payer: Non-veteran care

## 2020-01-03 LAB — CBC
HCT: 34.1 % — ABNORMAL LOW (ref 39.0–52.0)
Hemoglobin: 11.2 g/dL — ABNORMAL LOW (ref 13.0–17.0)
MCH: 31.2 pg (ref 26.0–34.0)
MCHC: 32.8 g/dL (ref 30.0–36.0)
MCV: 95 fL (ref 80.0–100.0)
Platelets: 252 10*3/uL (ref 150–400)
RBC: 3.59 MIL/uL — ABNORMAL LOW (ref 4.22–5.81)
RDW: 13.7 % (ref 11.5–15.5)
WBC: 13.1 10*3/uL — ABNORMAL HIGH (ref 4.0–10.5)
nRBC: 0 % (ref 0.0–0.2)

## 2020-01-03 LAB — RETICULOCYTES
Immature Retic Fract: 8.1 % (ref 2.3–15.9)
RBC.: 3.8 MIL/uL — ABNORMAL LOW (ref 4.22–5.81)
Retic Count, Absolute: 85.1 10*3/uL (ref 19.0–186.0)
Retic Ct Pct: 2.2 % (ref 0.4–3.1)

## 2020-01-03 LAB — RENAL FUNCTION PANEL
Albumin: 2.5 g/dL — ABNORMAL LOW (ref 3.5–5.0)
Anion gap: 9 (ref 5–15)
BUN: 28 mg/dL — ABNORMAL HIGH (ref 8–23)
CO2: 29 mmol/L (ref 22–32)
Calcium: 8.3 mg/dL — ABNORMAL LOW (ref 8.9–10.3)
Chloride: 106 mmol/L (ref 98–111)
Creatinine, Ser: 0.88 mg/dL (ref 0.61–1.24)
GFR calc Af Amer: >60 mL/min (ref >60)
GFR calc non Af Amer: >60 mL/min (ref >60)
Glucose, Bld: 110 mg/dL — ABNORMAL HIGH (ref 70–99)
Phosphorus: 2.4 mg/dL — ABNORMAL LOW (ref 2.5–4.6)
Potassium: 3.4 mmol/L — ABNORMAL LOW (ref 3.5–5.1)
Sodium: 144 mmol/L (ref 135–145)

## 2020-01-03 LAB — HEPATIC FUNCTION PANEL
ALT: 15 U/L (ref 0–44)
AST: 16 U/L (ref 15–41)
Albumin: 2.5 g/dL — ABNORMAL LOW (ref 3.5–5.0)
Alkaline Phosphatase: 48 U/L (ref 38–126)
Bilirubin, Direct: 0.2 mg/dL (ref 0.0–0.2)
Indirect Bilirubin: 0.3 mg/dL (ref 0.3–0.9)
Total Bilirubin: 0.5 mg/dL (ref 0.3–1.2)
Total Protein: 5 g/dL — ABNORMAL LOW (ref 6.5–8.1)

## 2020-01-03 LAB — IRON AND TIBC
Iron: 67 ug/dL (ref 45–182)
Saturation Ratios: 23 % (ref 17.9–39.5)
TIBC: 297 ug/dL (ref 250–450)
UIBC: 230 ug/dL

## 2020-01-03 LAB — LIPASE, BLOOD: Lipase: 31 U/L (ref 11–51)

## 2020-01-03 LAB — VITAMIN B12: Vitamin B-12: 449 pg/mL (ref 180–914)

## 2020-01-03 LAB — MAGNESIUM: Magnesium: 2 mg/dL (ref 1.7–2.4)

## 2020-01-03 LAB — FERRITIN: Ferritin: 32 ng/mL (ref 24–336)

## 2020-01-03 LAB — FOLATE: Folate: 8 ng/mL (ref >5.9)

## 2020-01-03 MED ORDER — KCL IN DEXTROSE-NACL 40-5-0.9 MEQ/L-%-% IV SOLN
INTRAVENOUS | Status: DC
  Filled 2020-01-03 (×8): qty 1000

## 2020-01-03 NOTE — Progress Notes (Signed)
Assessment & Plan: HD#8 - small bowel obstruction             AXR this AM with contrast in colon, less small bowel dilatation             Continue NG one more day due to small bowel dilatation - allow sips clear liquids  Encouraged OOB, ambulation today             Will follow  Discussed progress with family at bedside this AM.        Armandina Gemma, MD       [facility]       Office: [phone]   Chief Complaint: SBO  Subjective: Patient in bed, family at bedside.  Comfortable.  Wants to eat.  Objective: Vital signs in last 24 hours: Temp:  [97.5 F (36.4 C)-97.7 F (36.5 C)] 97.5 F (36.4 C) (04/04 0543) Pulse Rate:  [73-83] 73 (04/04 0543) Resp:  [17-18] 17 (04/04 0543) BP: (138-150)/(79-82) 147/79 (04/04 0543) SpO2:  [94 %-99 %] 94 % (04/04 0543) Last BM Date: 12/28/19  Intake/Output from previous day: 04/03 0701 - 04/04 0700 In: 1000 [I.V.:1000] Out: 2700 [Urine:1600; Emesis/NG output:1100] Intake/Output this shift: No intake/output data recorded.  Physical Exam: HEENT - sclerae clear, mucous membranes moist Neck - soft Abdomen - soft, mild distension; non-tender Ext - no edema, non-tender  Lab Results:  Recent Labs    01/02/20 0412 01/03/20 0245  WBC 12.9* 13.1*  HGB 10.7* 11.2*  HCT 33.2* 34.1*  PLT 245 252   BMET Recent Labs    01/02/20 0412 01/03/20 0245  NA 145 144  K 3.7 3.4*  CL 104 106  CO2 30 29  GLUCOSE 151* 110*  BUN 33* 28*  CREATININE 0.99 0.88  CALCIUM 8.6* 8.3*   PT/INR Recent Labs    12/31/19 1736  LABPROT 14.5  INR 1.1   Comprehensive Metabolic Panel:    Component Value Date/Time   NA 144 01/03/2020 0245   NA 145 01/02/2020 0412   K 3.4 (L) 01/03/2020 0245   K 3.7 01/02/2020 0412   CL 106 01/03/2020 0245   CL 104 01/02/2020 0412   CO2 29 01/03/2020 0245   CO2 30 01/02/2020 0412   BUN 28 (H) 01/03/2020 0245   BUN 33 (H) 01/02/2020 0412   CREATININE 0.88 01/03/2020 0245   CREATININE 0.99 01/02/2020 0412   GLUCOSE 110 (H) 01/03/2020 0245   GLUCOSE 151 (H) 01/02/2020 0412   CALCIUM 8.3 (L) 01/03/2020 0245   CALCIUM 8.6 (L) 01/02/2020 0412   AST 16 01/03/2020 0245   AST 21 12/31/2019 1736   ALT 15 01/03/2020 0245   ALT 17 12/31/2019 1736   ALKPHOS 48 01/03/2020 0245   ALKPHOS 67 12/31/2019 1736   BILITOT 0.5 01/03/2020 0245   BILITOT 1.2 12/31/2019 1736   PROT 5.0 (L) 01/03/2020 0245   PROT 6.7 12/31/2019 1736   ALBUMIN 2.5 (L) 01/03/2020 0245   ALBUMIN 2.5 (L) 01/03/2020 0245    Studies/Results: DG Abd 2 Views  Result Date: 01/01/2020 CLINICAL DATA:  Small-bowel obstruction. EXAM: ABDOMEN - 2 VIEW COMPARISON:  CT abdomen/pelvis 12/31/2019 FINDINGS: Persistently dilated small bowel loops measuring up to 6.3 cm in diameter. Multiple small bowel air-fluid levels are demonstrated on upright radiographs. No evidence of free air. Moderate stool burden within the right colon. Right common iliac artery stent. Contrast opacifies the urinary bladder. Surgical clips within the right upper quadrant of the abdomen. IMPRESSION:  Findings consistent with persistent small-bowel obstruction. No evidence of free air. Electronically Signed   By: Kellie Simmering DO   On: 01/01/2020 08:31   DG Abd Portable 1V  Result Date: 01/03/2020 CLINICAL DATA:  Small-bowel obstruction.  Abdominal distension. EXAM: PORTABLE ABDOMEN - 1 VIEW COMPARISON:  01/02/2020 and earlier exams. FINDINGS: Mild dilation of small bowel. Air and contrast is no sigmoid colon and rectum. Mild colonic distention. Nasal/orogastric tube curls in the left upper quadrant, stable. Right common iliac artery vascular stent is also stable. IMPRESSION: 1. Mild small bowel dilation with mild distension of the colon. Contrast lies in the colon and rectum. The degree of small-bowel dilation is significantly decreased when compared to the prior radiographs. Findings consistent with improved small-bowel obstruction. Electronically  Signed   By: Lajean Manes M.D.   On: 01/03/2020 07:16   DG Abd Portable 1V  Result Date: 01/03/2020 CLINICAL DATA:  NG tube placement EXAM: PORTABLE ABDOMEN - 1 VIEW COMPARISON:  January 02, 2020 FINDINGS: The NG tube projects over the left upper quadrant, similar to prior study. Partially visualized dilated loops of small bowel are noted in the upper abdomen. There are small bilateral pleural effusions with adjacent atelectasis, left greater than right. Vascular congestion is noted. IMPRESSION: 1. NG tube as above. 2. Dilated loops of small bowel are noted in the upper abdomen. 3. There are small bilateral pleural effusions with adjacent atelectasis. Electronically Signed   By: Constance Holster M.D.   On: 01/03/2020 03:02   DG Abd Portable 1V-Small Bowel Obstruction Protocol-initial, 8 hr delay  Result Date: 01/02/2020 CLINICAL DATA:  Bowel obstruction. EXAM: PORTABLE ABDOMEN - 1 VIEW COMPARISON:  01/01/2020 FINDINGS: The enteric tube projects over the left upper quadrant. Multiple dilated loops of small bowel are again noted throughout the abdomen. The majority of the oral contrast remains in the small bowel. There is no convincing oral contrast within the colon. There is a large amount of stool in the ascending colon. There is no definite pneumoperitoneum. No pneumatosis. IMPRESSION: Multiple dilated loops of small bowel throughout the abdomen. The majority of the oral contrast remains in the small bowel. Electronically Signed   By: Constance Holster M.D.   On: 01/02/2020 03:25   DG Abd Portable 1V-Small Bowel Protocol-Position Verification  Result Date: 01/01/2020 CLINICAL DATA:  NG tube placement EXAM: PORTABLE ABDOMEN - 1 VIEW COMPARISON:  Abdominal radiographs, 01/01/2020 FINDINGS: Esophagogastric tube is positioned with tip and side port below the diaphragm. Redemonstrated gas-filled loops of bowel in the included upper abdomen. No free air. IMPRESSION: Esophagogastric tube with tip and side port  below the diaphragm. Electronically Signed   By: Eddie Candle M.D.   On: 01/01/2020 17:49      Armandina Gemma 01/03/2020  Patient ID: James [name], male   DOB: [birth year], 69 y.o.   MRN: [id number]

## 2020-01-03 NOTE — Progress Notes (Signed)
PROGRESS NOTE  James [name] [id number]:[id number] DOB: 1951-01-30   PCP: Ann Maki, MD  Patient is from: Home.  DOA: 12/31/2019 LOS: 3  Brief Narrative / Interim history: 69 year old male with history of COPD/chronic RF on 2 to 3 L, PAF on Pradaxa, dementia, lung cancer status post lobectomy, AAA, multiple abdominal surgery (laparotomy, colostomy and colostomy reversal in 2014) and recent hospitalization for COPD exacerbation from 3/27-3/30 presenting with nausea, vomiting and abdominal pain, and admitted for small bowel obstruction and possible pneumonia.  Last bowel movements about 4 days prior to admission.  In ED, in A. fib with mild RVR to 120.  WBC 15.3. Cr 1.14 (about baseline).  BUN 33.  K3.5.  AG 16.  CBC and CMP suggestive for dehydration.  EKG with A. fib and RVR to 120s.  FOBT negative.  COVID-19 negative.  CXR with LLL opacity and emphysematous changes. CT abdomen and pelvis revealed SBO with transition point in left mid abdomen likely related to underlying adhesions, and increased LLL consolidation. Patient was started on IV fluid, ceftriaxone, azithromycin and antiemetics, and admitted for SBO and LLL pneumonia..  General surgery consulted.  Managing SBO conservatively with NG tube decompression.   Patient had acute urinary retention with bladder over distention the night of 4/2-4/3.  Foley catheter inserted for decompression and bladder rest.  Subjective: Seen and examined earlier this morning.  Patient pulled out his NG tube and IV fluids last night.  Both PIV and NG tube replaced.  No complaint this morning.  He denies chest pain, dyspnea, nausea or abdominal pain. Objective: Vitals:   01/03/20 0844 01/03/20 0846 01/03/20 0847 01/03/20 1210  BP:    (!) 148/94  Pulse:    86  Resp:      Temp:      TempSrc:      SpO2: 96% 97% 97%     Intake/Output Summary (Last 24 hours) at 01/03/2020 1514 Last data filed at 01/03/2020 0900 Gross per 24 hour  Intake 1000 ml  Output 2700  ml  Net -1700 ml   There were no vitals filed for this visit.  Examination:  GENERAL: No apparent distress.  Nontoxic. HEENT: MMM.  NG tube to wall suction.  About 550 cc / 12 hours. NECK: Supple.  No apparent JVD.  RESP: 93% on 3 L.  No IWOB.  Fair aeration bilaterally. CVS:  RRR. Heart sounds normal.  ABD/GI/GU: Bowel sounds present. Soft. Non tender.  MSK/EXT:  Moves extremities. No apparent deformity. No edema.  SKIN: no apparent skin lesion or wound NEURO: Awake, alert and oriented self and family..  No apparent focal neuro deficit. PSYCH: Calm. Normal affect  GENERAL: No acute distress.  Appears well.  HEENT: MMM.  NGT to wall suction. NECK: Supple.  No apparent JVD.  RESP: 94% on 4 L.  No IWOB.  Fair aeration bilaterally. CVS:  RRR. Heart sounds normal.  ABD/GI/GU: Bowel sounds present. Soft. Non tender.  MSK/EXT:  Moves extremities. No apparent deformity. No edema.  SKIN: no apparent skin lesion or wound NEURO: Awake, alert and oriented self and family.  No apparent focal neuro deficit. PSYCH: Calm. Normal affect.  Procedures:  None  Assessment & Plan: SBO likely due to adhesion in patient with history of multiple bowel surgeries in 2014. -General surgery following. -DG abdomen with improved SBO with contrast in colon. -Continue NG tube decompression, IVF, PPI, and ice chips  Left lower lobe pneumonia: CXR and CT abdomen and pelvis concerning for worsening  LLL consolidation.  Patient and family denies new respiratory symptoms.  However, patient is not a reliable historian. -Continue ceftriaxone and azithromycin empirically. -Encourage incentive spirometry  Chronic COPD/chronic respiratory failure with recent exacerbation: On 3 L at baseline. -Discontinued systemic steroid-completed 5 days course became previous admission and this admission. -Continue breathing treatments-Dulera, Incruse Ellipta and as needed DuoNeb -Encourage incentive spirometry  Paroxysmal A.  fib with RVR: RVR resolved.  On Cardizem and Pradaxa at home. -IV metoprolol 2.5 mg every 8 hours.  Not bridging anticoagulation. -Continue holding home medications while NPO.  Essential hypertension: Normotensive -IV metoprolol as above. -Continue holding home medications while NPO.  Dementia without behavioral disturbance: Stable. -Frequent reorientation and fall and delirium precautions. -Haldol 2 mg IM as needed agitation  Acute urinary retention with bladder over distention: Had about 1000 cc on I&O cath the night of 4/2.  Has history of BPH. -Continue indwelling Foley at least for 1 week -We will start bethanechol when he starts p.o. intake. -May need outpatient urology follow-up for voiding trial.  Chronic pain-on Norco and gabapentin at home. -Fentanyl as needed.  Hyperlipidemia -Continue home statin.  BPH: On Proscar at home. -Now with indwelling Foley  Normocytic anemia: H&H stable.  Anemia panel normal. -Continue monitoring.  Hypokalemia:  -Replenished and recheck                DVT prophylaxis: Subcu Lovenox. Code Status: DNR/DNI Family Communication: Updated patient's wife and daughter at bedside on 4/3.  Discharge barrier: N.p.o. due to small bowel obstruction requiring NG tube and IV fluids Patient is from: Home. Final disposition: To be determined.  Consultants: General surgery   Microbiology summarized: [id number] negative. Blood cultures negative. Sch Meds:  Scheduled Meds: . atorvastatin  40 mg Oral Daily  . Chlorhexidine Gluconate Cloth  6 each Topical Daily  . enoxaparin (LOVENOX) injection  40 mg Subcutaneous Q24H  . memantine  10 mg Oral BID  . metoprolol tartrate  2.5 mg Intravenous Q6H  . mometasone-formoterol  2 puff Inhalation BID  . pantoprazole (PROTONIX) IV  40 mg Intravenous Q24H  . umeclidinium bromide  1 puff Inhalation Daily   Continuous Infusions: . azithromycin 500 mg (01/02/20 2311)  . cefTRIAXone (ROCEPHIN)  IV 2  g (01/02/20 2229)  . dextrose 5 % and 0.9 % NaCl with KCl 40 mEq/L 100 mL/hr at 01/03/20 1157   PRN Meds:.acetaminophen **OR** acetaminophen, fentaNYL (SUBLIMAZE) injection, fluticasone, haloperidol **OR** haloperidol lactate, ipratropium-albuterol, magnesium hydroxide, ondansetron **OR** ondansetron (ZOFRAN) IV, phenol  Antimicrobials: Anti-infectives (From admission, onward)   Start     Dose/Rate Route Frequency Ordered Stop   12/31/19 2230  cefTRIAXone (ROCEPHIN) 2 g in sodium chloride 0.9 % 100 mL IVPB     2 g 200 mL/hr over 30 Minutes Intravenous Every 24 hours 12/31/19 2219     12/31/19 2230  azithromycin (ZITHROMAX) 500 mg in sodium chloride 0.9 % 250 mL IVPB     500 mg 250 mL/hr over 60 Minutes Intravenous Every 24 hours 12/31/19 2219         I have personally reviewed the following labs and images: CBC: Recent Labs  Lab 12/28/19 0125 12/31/19 1736 01/01/20 0306 01/02/20 0412 01/03/20 0245  WBC 13.3* 15.3* 12.9* 12.9* 13.1*  NEUTROABS 11.6*  --   --   --   --   HGB 12.8* 14.2 12.5* 10.7* 11.2*  HCT 38.6* 43.4 38.4* 33.2* 34.1*  MCV 92.6 92.3 92.8 94.1 95.0  PLT 261 310 281 245 252  BMP &GFR Recent Labs  Lab 12/28/19 0125 12/31/19 1736 12/31/19 2252 01/01/20 0306 01/02/20 0412 01/03/20 0245  NA 137 138  --  137 145 144  K 4.4 3.5  --  3.9 3.7 3.4*  CL 100 89*  --  94* 104 106  CO2 24 33*  --  31 30 29   GLUCOSE 142* 136*  --  128* 151* 110*  BUN 11 33*  --  32* 33* 28*  CREATININE 1.10 1.14  --  1.11 0.99 0.88  CALCIUM 8.8* 9.5  --  8.8* 8.6* 8.3*  MG 1.7  --  2.0  --  2.0 2.0  PHOS  --   --   --   --  3.7 2.4*   Estimated Creatinine Clearance: 93.4 mL/min (by C-G formula based on SCr of 0.88 mg/dL). Liver & Pancreas: Recent Labs  Lab 12/28/19 0125 12/31/19 1736 01/02/20 0412 01/03/20 0245  AST 24 21  --  16  ALT 15 17  --  15  ALKPHOS 83 67  --  48  BILITOT 0.8 1.2  --  0.5  PROT 6.5 6.7  --  5.0*  ALBUMIN 3.4* 3.4* 2.5* 2.5*  2.5*    Recent Labs  Lab 01/03/20 0245  LIPASE 31   No results for input(s): AMMONIA in the last 168 hours. Diabetic: No results for input(s): HGBA1C in the last 72 hours. No results for input(s): GLUCAP in the last 168 hours. Cardiac Enzymes: No results for input(s): CKTOTAL, CKMB, CKMBINDEX, TROPONINI in the last 168 hours. No results for input(s): PROBNP in the last 8760 hours. Coagulation Profile: Recent Labs  Lab 12/31/19 1736  INR 1.1   Thyroid Function Tests: No results for input(s): TSH, T4TOTAL, FREET4, T3FREE, THYROIDAB in the last 72 hours. Lipid Profile: No results for input(s): CHOL, HDL, LDLCALC, TRIG, CHOLHDL, LDLDIRECT in the last 72 hours. Anemia Panel: Recent Labs    01/03/20 0934  VITAMINB12 449  FOLATE 8.0  FERRITIN 32  TIBC 297  IRON 67  RETICCTPCT 2.2   Urine analysis:    Component Value Date/Time   COLORURINE RED (A) 11/12/2012 1529   APPEARANCEUR HAZY (A) 11/12/2012 1529   LABSPEC 1.009 11/12/2012 1529   PHURINE 6.5 11/12/2012 1529   GLUCOSEU NEGATIVE 11/12/2012 1529   HGBUR LARGE (A) 11/12/2012 1529   BILIRUBINUR NEGATIVE 11/12/2012 1529   KETONESUR NEGATIVE 11/12/2012 1529   PROTEINUR NEGATIVE 11/12/2012 1529   UROBILINOGEN 0.2 11/12/2012 1529   NITRITE NEGATIVE 11/12/2012 1529   LEUKOCYTESUR NEGATIVE 11/12/2012 1529   Sepsis Labs: Invalid input(s): PROCALCITONIN, [place]  Microbiology: Recent Results (from the past 240 hour(s))  SARS CORONAVIRUS 2 (TAT 6-24 HRS) Nasopharyngeal Nasopharyngeal Swab     Status: None   Collection Time: 12/27/19  2:58 AM   Specimen: Nasopharyngeal Swab  Result Value Ref Range Status   SARS Coronavirus 2 NEGATIVE NEGATIVE Final    Comment: (NOTE) SARS-CoV-2 target nucleic acids are NOT DETECTED. The SARS-CoV-2 RNA is generally detectable in upper and lower respiratory specimens during the acute phase of infection. Negative results do not preclude SARS-CoV-2 infection, do not rule out co-infections  with other pathogens, and should not be used as the sole basis for treatment or other patient management decisions. Negative results must be combined with clinical observations, patient history, and epidemiological information. The expected result is Negative. Fact Sheet for Patients: SugarRoll.be Fact Sheet for Healthcare Providers: https://www.woods-mathews.com/ This test is not yet approved or cleared by the [place] FDA and  has been authorized for detection and/or diagnosis of SARS-CoV-2 by FDA under an Emergency Use Authorization (EUA). This EUA will remain  in effect (meaning this test can be used) for the duration of the COVID-19 declaration under Section 56 4(b)(1) of the Act, 21 U.S.C. section 360bbb-3(b)(1), unless the authorization is terminated or revoked sooner. Performed at [facility], [place] [address], [place], [place] [address]   Culture, blood (routine x 2)     Status: None   Collection Time: 12/27/19  4:22 AM   Specimen: BLOOD  Result Value Ref Range Status   Specimen Description BLOOD RIGHT ARM  Final   Special Requests   Final    BOTTLES DRAWN AEROBIC AND ANAEROBIC Blood Culture adequate volume   Culture   Final    NO GROWTH 5 DAYS Performed at [facility], [address], [place], [place] [address]    Report Status 01/01/2020 FINAL  Final  Culture, blood (routine x 2)     Status: None   Collection Time: 12/27/19  4:22 AM   Specimen: BLOOD  Result Value Ref Range Status   Specimen Description BLOOD RIGHT HAND  Final   Special Requests   Final    BOTTLES DRAWN AEROBIC ONLY Blood Culture adequate volume   Culture   Final    NO GROWTH 5 DAYS Performed at [facility], [place] [address] [place], [place] [address]    Report Status 01/01/2020 FINAL  Final  SARS CORONAVIRUS 2 (TAT 6-24 HRS) Nasopharyngeal Nasopharyngeal Swab     Status: None   Collection Time: 12/31/19  9:31 PM   Specimen:  Nasopharyngeal Swab  Result Value Ref Range Status   SARS Coronavirus 2 NEGATIVE NEGATIVE Final    Comment: (NOTE) SARS-CoV-2 target nucleic acids are NOT DETECTED. The SARS-CoV-2 RNA is generally detectable in upper and lower respiratory specimens during the acute phase of infection. Negative results do not preclude SARS-CoV-2 infection, do not rule out co-infections with other pathogens, and should not be used as the sole basis for treatment or other patient management decisions. Negative results must be combined with clinical observations, patient history, and epidemiological information. The expected result is Negative. Fact Sheet for Patients: SugarRoll.be Fact Sheet for Healthcare Providers: https://www.woods-mathews.com/ This test is not yet approved or cleared by the [place] FDA and  has been authorized for detection and/or diagnosis of SARS-CoV-2 by FDA under an Emergency Use Authorization (EUA). This EUA will remain  in effect (meaning this test can be used) for the duration of the COVID-19 declaration under Section 56 4(b)(1) of the Act, 21 U.S.C. section 360bbb-3(b)(1), unless the authorization is terminated or revoked sooner. Performed at [facility], [place] [address] [place], [place] [address]   Culture, blood (routine x 2)     Status: None (Preliminary result)   Collection Time: 12/31/19 10:50 PM   Specimen: BLOOD LEFT FOREARM  Result Value Ref Range Status   Specimen Description BLOOD LEFT FOREARM  Final   Special Requests   Final    BOTTLES DRAWN AEROBIC AND ANAEROBIC Blood Culture adequate volume   Culture   Final    NO GROWTH 2 DAYS Performed at [facility], [place] [address], [place],  [address]    Report Status PENDING  Incomplete  Culture, blood (routine x 2)     Status: None (Preliminary result)   Collection Time: 12/31/19 11:15 PM   Specimen: BLOOD LEFT ARM  Result Value Ref  Range Status    Specimen Description BLOOD LEFT ARM  Final   Special Requests   Final    BOTTLES DRAWN AEROBIC AND ANAEROBIC Blood Culture adequate volume   Culture   Final    NO GROWTH 2 DAYS Performed at [facility], [address] [place],  [address]    Report Status PENDING  Incomplete    Radiology Studies: DG Abd Portable 1V  Result Date: 01/03/2020 CLINICAL DATA:  Small-bowel obstruction.  Abdominal distension. EXAM: PORTABLE ABDOMEN - 1 VIEW COMPARISON:  01/02/2020 and earlier exams. FINDINGS: Mild dilation of small bowel. Air and contrast is no sigmoid colon and rectum. Mild colonic distention. Nasal/orogastric tube curls in the left upper quadrant, stable. Right common iliac artery vascular stent is also stable. IMPRESSION: 1. Mild small bowel dilation with mild distension of the colon. Contrast lies in the colon and rectum. The degree of small-bowel dilation is significantly decreased when compared to the prior radiographs. Findings consistent with improved small-bowel obstruction. Electronically Signed   By: Lajean Manes M.D.   On: 01/03/2020 07:16   DG Abd Portable 1V  Result Date: 01/03/2020 CLINICAL DATA:  NG tube placement EXAM: PORTABLE ABDOMEN - 1 VIEW COMPARISON:  January 02, 2020 FINDINGS: The NG tube projects over the left upper quadrant, similar to prior study. Partially visualized dilated loops of small bowel are noted in the upper abdomen. There are small bilateral pleural effusions with adjacent atelectasis, left greater than right. Vascular congestion is noted. IMPRESSION: 1. NG tube as above. 2. Dilated loops of small bowel are noted in the upper abdomen. 3. There are small bilateral pleural effusions with adjacent atelectasis. Electronically Signed   By: Constance Holster M.D.   On: 01/03/2020 03:02    Haruo Stepanek T. [place]  If 7PM-7AM, please contact night-coverage [email or web address] Password [facility] 01/03/2020, 3:14 PM

## 2020-01-03 NOTE — Progress Notes (Signed)
Patient's wife verbalized during shift change that she wants husband to be a Full Code. Will notify MD on call.

## 2020-01-03 NOTE — Progress Notes (Signed)
Patient accidentally pulled out his IV and dislodged his NGT, IV team consulted for new access and NP made aware that  NGT has been dislodged. Orders received for placement check

## 2020-01-04 DIAGNOSIS — E876 Hypokalemia: Secondary | ICD-10-CM

## 2020-01-04 LAB — RENAL FUNCTION PANEL
Albumin: 2.5 g/dL — ABNORMAL LOW (ref 3.5–5.0)
Anion gap: 8 (ref 5–15)
BUN: 20 mg/dL (ref 8–23)
CO2: 29 mmol/L (ref 22–32)
Calcium: 7.9 mg/dL — ABNORMAL LOW (ref 8.9–10.3)
Chloride: 103 mmol/L (ref 98–111)
Creatinine, Ser: 0.82 mg/dL (ref 0.61–1.24)
GFR calc Af Amer: >60 mL/min (ref >60)
GFR calc non Af Amer: >60 mL/min (ref >60)
Glucose, Bld: 99 mg/dL (ref 70–99)
Phosphorus: 2.4 mg/dL — ABNORMAL LOW (ref 2.5–4.6)
Potassium: 3.3 mmol/L — ABNORMAL LOW (ref 3.5–5.1)
Sodium: 140 mmol/L (ref 135–145)

## 2020-01-04 LAB — CBC
HCT: 37.6 % — ABNORMAL LOW (ref 39.0–52.0)
Hemoglobin: 12 g/dL — ABNORMAL LOW (ref 13.0–17.0)
MCH: 29.7 pg (ref 26.0–34.0)
MCHC: 31.9 g/dL (ref 30.0–36.0)
MCV: 93.1 fL (ref 80.0–100.0)
Platelets: 274 10*3/uL (ref 150–400)
RBC: 4.04 MIL/uL — ABNORMAL LOW (ref 4.22–5.81)
RDW: 13.3 % (ref 11.5–15.5)
WBC: 13.8 10*3/uL — ABNORMAL HIGH (ref 4.0–10.5)
nRBC: 0 % (ref 0.0–0.2)

## 2020-01-04 LAB — MAGNESIUM: Magnesium: 1.8 mg/dL (ref 1.7–2.4)

## 2020-01-04 MED ORDER — DILTIAZEM HCL ER COATED BEADS 240 MG PO CP24
240.0000 mg | ORAL_CAPSULE | Freq: Two times a day (BID) | ORAL | Status: DC
  Administered 2020-01-04 – 2020-01-05 (×3): 240 mg via ORAL
  Filled 2020-01-04 (×3): qty 1

## 2020-01-04 MED ORDER — FINASTERIDE 5 MG PO TABS
5.0000 mg | ORAL_TABLET | Freq: Every day | ORAL | Status: DC
  Administered 2020-01-04 – 2020-01-05 (×2): 5 mg via ORAL
  Filled 2020-01-04 (×2): qty 1

## 2020-01-04 MED ORDER — TAMSULOSIN HCL 0.4 MG PO CAPS
0.4000 mg | ORAL_CAPSULE | Freq: Every day | ORAL | Status: DC
  Administered 2020-01-04: 0.4 mg via ORAL
  Filled 2020-01-04: qty 1

## 2020-01-04 MED ORDER — METOPROLOL TARTRATE 5 MG/5ML IV SOLN
5.0000 mg | Freq: Four times a day (QID) | INTRAVENOUS | Status: DC
  Administered 2020-01-04: 5 mg via INTRAVENOUS
  Filled 2020-01-04: qty 5

## 2020-01-04 MED ORDER — POTASSIUM CHLORIDE 10 MEQ/100ML IV SOLN
10.0000 meq | INTRAVENOUS | Status: AC
  Administered 2020-01-04 (×4): 10 meq via INTRAVENOUS
  Filled 2020-01-04 (×4): qty 100

## 2020-01-04 MED ORDER — MAGNESIUM SULFATE 2 GM/50ML IV SOLN
2.0000 g | Freq: Once | INTRAVENOUS | Status: AC
  Administered 2020-01-04: 17:00:00 2 g via INTRAVENOUS
  Filled 2020-01-04: qty 50

## 2020-01-04 MED ORDER — GABAPENTIN 300 MG PO CAPS
300.0000 mg | ORAL_CAPSULE | Freq: Every day | ORAL | Status: DC
  Administered 2020-01-04: 300 mg via ORAL
  Filled 2020-01-04: qty 1

## 2020-01-04 NOTE — Plan of Care (Signed)
  Problem: Education: Goal: Knowledge of General Education information will improve Description Including pain rating scale, medication(s)/side effects and non-pharmacologic comfort measures Outcome: Progressing   

## 2020-01-04 NOTE — Progress Notes (Signed)
Patient ID: James [name], male   DOB: 12-Nov-1950, 69 y.o.   MRN: [id number]       Subjective: Patient had 2 BMs overnight and possibly one currently.  NGt with decrease in output.  Denies abdominal pain.  ROS: See above, otherwise other systems negative  Objective: Vital signs in last 24 hours: Temp:  [97.7 F (36.5 C)-98.1 F (36.7 C)] 97.7 F (36.5 C) (04/05 0510) Pulse Rate:  [86-91] 87 (04/05 0510) Resp:  [17-18] 17 (04/05 0510) BP: (148-174)/(89-94) 166/93 (04/05 0510) SpO2:  [95 %-98 %] 96 % (04/05 0510) Last BM Date: 01/03/20  Intake/Output from previous day: 04/04 0701 - 04/05 0700 In: 2116.5 [P.O.:1120; I.V.:646.5; IV Piggyback:350] Out: 3975 [Urine:2125; Emesis/NG JJOACZ:6606] Intake/Output this shift: No intake/output data recorded.  PE: Heart: regular Lungs: CTAB, on O2 Abd: soft, NT, ND, +BS  Lab Results:  Recent Labs    01/03/20 0245 01/04/20 0505  WBC 13.1* 13.8*  HGB 11.2* 12.0*  HCT 34.1* 37.6*  PLT 252 274   BMET Recent Labs    01/03/20 0245 01/04/20 0505  NA 144 140  K 3.4* 3.3*  CL 106 103  CO2 29 29  GLUCOSE 110* 99  BUN 28* 20  CREATININE 0.88 0.82  CALCIUM 8.3* 7.9*   PT/INR No results for input(s): LABPROT, INR in the last 72 hours. CMP     Component Value Date/Time   NA 140 01/04/2020 0505   K 3.3 (L) 01/04/2020 0505   CL 103 01/04/2020 0505   CO2 29 01/04/2020 0505   GLUCOSE 99 01/04/2020 0505   BUN 20 01/04/2020 0505   CREATININE 0.82 01/04/2020 0505   CALCIUM 7.9 (L) 01/04/2020 0505   PROT 5.0 (L) 01/03/2020 0245   ALBUMIN 2.5 (L) 01/04/2020 0505   AST 16 01/03/2020 0245   ALT 15 01/03/2020 0245   ALKPHOS 48 01/03/2020 0245   BILITOT 0.5 01/03/2020 0245   GFRNONAA >60 01/04/2020 0505   GFRAA >60 01/04/2020 0505   Lipase     Component Value Date/Time   LIPASE 31 01/03/2020 0245       Studies/Results: DG Abd Portable 1V  Result Date: 01/03/2020 CLINICAL DATA:  Small-bowel obstruction.  Abdominal  distension. EXAM: PORTABLE ABDOMEN - 1 VIEW COMPARISON:  01/02/2020 and earlier exams. FINDINGS: Mild dilation of small bowel. Air and contrast is no sigmoid colon and rectum. Mild colonic distention. Nasal/orogastric tube curls in the left upper quadrant, stable. Right common iliac artery vascular stent is also stable. IMPRESSION: 1. Mild small bowel dilation with mild distension of the colon. Contrast lies in the colon and rectum. The degree of small-bowel dilation is significantly decreased when compared to the prior radiographs. Findings consistent with improved small-bowel obstruction. Electronically Signed   By: Lajean Manes M.D.   On: 01/03/2020 07:16   DG Abd Portable 1V  Result Date: 01/03/2020 CLINICAL DATA:  NG tube placement EXAM: PORTABLE ABDOMEN - 1 VIEW COMPARISON:  January 02, 2020 FINDINGS: The NG tube projects over the left upper quadrant, similar to prior study. Partially visualized dilated loops of small bowel are noted in the upper abdomen. There are small bilateral pleural effusions with adjacent atelectasis, left greater than right. Vascular congestion is noted. IMPRESSION: 1. NG tube as above. 2. Dilated loops of small bowel are noted in the upper abdomen. 3. There are small bilateral pleural effusions with adjacent atelectasis. Electronically Signed   By: Constance Holster M.D.   On: 01/03/2020 03:02    Anti-infectives: Anti-infectives (From  admission, onward)   Start     Dose/Rate Route Frequency Ordered Stop   12/31/19 2230  cefTRIAXone (ROCEPHIN) 2 g in sodium chloride 0.9 % 100 mL IVPB     2 g 200 mL/hr over 30 Minutes Intravenous Every 24 hours 12/31/19 2219     12/31/19 2230  azithromycin (ZITHROMAX) 500 mg in sodium chloride 0.9 % 250 mL IVPB     500 mg 250 mL/hr over 60 Minutes Intravenous Every 24 hours 12/31/19 2219         Assessment/Plan SBO -SBO protocol with contrast in colon and rectum on films yesterday. -had 2 BMs overnight.  NGT output decreased  currently -Dc NGT today and give CLD  FEN - CLD VTE - Lovenox ID - recephin/zithromax for CAP   LOS: 4 days    Henreitta Cea , [facility] Surgery 01/04/2020, 9:11 AM Please see Amion for pager number during day hours 7:00am-4:30pm or 7:00am -11:30am on weekends

## 2020-01-04 NOTE — Progress Notes (Signed)
PROGRESS NOTE  James [name] QVZ:[id number] DOB: [birth year]   PCP: Ann Maki, MD  Patient is from: Home.  DOA: 12/31/2019 LOS: 4  Brief Narrative / Interim history: 69 year old male with history of COPD/chronic RF on 2 to 3 L, PAF on Pradaxa, dementia, lung cancer status post lobectomy, AAA, multiple abdominal surgery (laparotomy, colostomy and colostomy reversal in 2014) and recent hospitalization for COPD exacerbation from 3/27-3/30 presenting with nausea, vomiting and abdominal pain, and admitted for small bowel obstruction and possible pneumonia.  Last bowel movements about 4 days prior to admission.  In ED, in A. fib with mild RVR to 120.  WBC 15.3. Cr 1.14 (about baseline).  BUN 33.  K3.5.  AG 16.  CBC and CMP suggestive for dehydration.  EKG with A. fib and RVR to 120s.  FOBT negative.  COVID-19 negative.  CXR with LLL opacity and emphysematous changes. CT abdomen and pelvis revealed SBO with transition point in left mid abdomen likely related to underlying adhesions, and increased LLL consolidation. Patient was started on IV fluid, ceftriaxone, azithromycin and antiemetics, and admitted for SBO and LLL pneumonia..  General surgery consulted.  Managing SBO conservatively with NG tube decompression.   Patient had acute urinary retention with bladder over distention the night of 4/2-4/3.  Foley catheter inserted for decompression and bladder rest.  Subjective: Seen and examined earlier this morning.  No major events overnight or this morning.  No complaints.  Patient's wife at bedside.  She says he had 3 bowel movements, 2 overnight and 1 earlier this morning.  Patient denies chest pain, dyspnea or abdominal pain.  Objective: Vitals:   01/03/20 1949 01/03/20 2056 01/04/20 0510 01/04/20 1311  BP:  (!) 174/94 (!) 166/93 (!) 149/84  Pulse:  87 87 87  Resp:   17 19  Temp:  98.1 F (36.7 C) 97.7 F (36.5 C) 97.9 F (36.6 C)  TempSrc:  Oral Oral Oral  SpO2: 98% 96% 96% 92%     Intake/Output Summary (Last 24 hours) at 01/04/2020 1503 Last data filed at 01/04/2020 3295 Gross per 24 hour  Intake 1636.5 ml  Output 3250 ml  Net -1613.5 ml   There were no vitals filed for this visit.  Examination:  GENERAL: No acute distress.  Appears well.  HEENT: MMM.  NGT to wall suction. NECK: Supple.  No apparent JVD.  RESP: 96% on 3 L.  No IWOB. Good air movement bilaterally. CVS:  RRR. Heart sounds normal.  ABD/GI/GU: Bowel sounds present. Soft. Non tender.  Indwelling Foley. MSK/EXT:  Moves extremities. No apparent deformity. No edema.  SKIN: no apparent skin lesion or wound NEURO: Awake, alert and oriented self and family.  No apparent focal neuro deficit. PSYCH: Calm. Normal affect.  Procedures:  None  Assessment & Plan: SBO likely due to adhesion in patient with history of multiple bowel surgeries in 2014. -General surgery following. -DG abdomen on 4/4 with improved SBO with contrast in colon. -Patient had 3 BMs, 2 last night of 1 this morning. -NGT to be discontinued, and restart CLD. -Continue IVF, PPI, and ice chips  Left lower lobe pneumonia: CXR and CT abdomen and pelvis concerning for worsening LLL consolidation.  Patient and family denies new respiratory symptoms.  However, patient is not a reliable historian. -Continue ceftriaxone and azithromycin for 5 days empirically . -Encourage incentive spirometry  Chronic COPD/chronic respiratory failure with recent exacerbation: On 3 L at baseline. -Discontinued systemic steroid-completed 5 days course became previous admission and this  admission. -Continue breathing treatments-Dulera, Incruse Ellipta and as needed DuoNeb -Encourage incentive spirometry  Paroxysmal A. fib with RVR: RVR resolved.  On Cardizem and Pradaxa at home. -Restart home Cardizem.   -Continue holding Pradaxa.  No need for bridging  Essential hypertension: Normotensive -Resume home medications.  Dementia without behavioral  disturbance: Stable. -Frequent reorientation and fall and delirium precautions. -Resume home medications.   BPH with acute urinary tension and bladder over distention: had about 1000 cc on I&O cath the night of 4/2.  Has history of BPH. -Continue indwelling Foley at least for 1 week -Outpatient follow-up with urology for voiding trial -Resume home Proscar.  Add Flomax.  Chronic pain-on Norco and gabapentin at home. -Fentanyl as needed.  Hyperlipidemia -Continue home statin.  Normocytic anemia: H&H stable.  Anemia panel normal. -Continue monitoring.  Hypokalemia:  -Replenished and recheck                DVT prophylaxis: Subcu Lovenox. Code Status: DNR/DNI Family Communication: Updated patient's wife at bedside.  Discharge barrier: SBO.  On clear liquid diet and IV fluids. Patient is from: Home. Final disposition: Likely in the next 24 to 48 hours once cleared by general surgery  Consultants: General surgery   Microbiology summarized: COVID-19 negative. Blood cultures negative. Sch Meds:  Scheduled Meds: . atorvastatin  40 mg Oral Daily  . Chlorhexidine Gluconate Cloth  6 each Topical Daily  . diltiazem  240 mg Oral BID  . enoxaparin (LOVENOX) injection  40 mg Subcutaneous Q24H  . finasteride  5 mg Oral Daily  . gabapentin  300 mg Oral QHS  . memantine  10 mg Oral BID  . mometasone-formoterol  2 puff Inhalation BID  . pantoprazole (PROTONIX) IV  40 mg Intravenous Q24H  . tamsulosin  0.4 mg Oral QPC supper  . umeclidinium bromide  1 puff Inhalation Daily   Continuous Infusions: . azithromycin 500 mg (01/03/20 2241)  . cefTRIAXone (ROCEPHIN)  IV 2 g (01/03/20 2347)  . dextrose 5 % and 0.9 % NaCl with KCl 40 mEq/L 100 mL/hr at 01/04/20 1149  . magnesium sulfate bolus IVPB    . potassium chloride 10 mEq (01/04/20 1432)   PRN Meds:.acetaminophen **OR** acetaminophen, fentaNYL (SUBLIMAZE) injection, fluticasone, haloperidol **OR** haloperidol lactate,  ipratropium-albuterol, magnesium hydroxide, ondansetron **OR** ondansetron (ZOFRAN) IV, phenol  Antimicrobials: Anti-infectives (From admission, onward)   Start     Dose/Rate Route Frequency Ordered Stop   12/31/19 2230  cefTRIAXone (ROCEPHIN) 2 g in sodium chloride 0.9 % 100 mL IVPB     2 g 200 mL/hr over 30 Minutes Intravenous Every 24 hours 12/31/19 2219     12/31/19 2230  azithromycin (ZITHROMAX) 500 mg in sodium chloride 0.9 % 250 mL IVPB     500 mg 250 mL/hr over 60 Minutes Intravenous Every 24 hours 12/31/19 2219         I have personally reviewed the following labs and images: CBC: Recent Labs  Lab 12/31/19 1736 01/01/20 0306 01/02/20 0412 01/03/20 0245 01/04/20 0505  WBC 15.3* 12.9* 12.9* 13.1* 13.8*  HGB 14.2 12.5* 10.7* 11.2* 12.0*  HCT 43.4 38.4* 33.2* 34.1* 37.6*  MCV 92.3 92.8 94.1 95.0 93.1  PLT 310 281 245 252 274   BMP &GFR Recent Labs  Lab 12/31/19 1736 12/31/19 2252 01/01/20 0306 01/02/20 0412 01/03/20 0245 01/04/20 0505  NA 138  --  137 145 144 140  K 3.5  --  3.9 3.7 3.4* 3.3*  CL 89*  --  94* 104  106 103  CO2 33*  --  31 30 29 29   GLUCOSE 136*  --  128* 151* 110* 99  BUN 33*  --  32* 33* 28* 20  CREATININE 1.14  --  1.11 0.99 0.88 0.82  CALCIUM 9.5  --  8.8* 8.6* 8.3* 7.9*  MG  --  2.0  --  2.0 2.0 1.8  PHOS  --   --   --  3.7 2.4* 2.4*   Estimated Creatinine Clearance: 100.2 mL/min (by C-G formula based on SCr of 0.82 mg/dL). Liver & Pancreas: Recent Labs  Lab 12/31/19 1736 01/02/20 0412 01/03/20 0245 01/04/20 0505  AST 21  --  16  --   ALT 17  --  15  --   ALKPHOS 67  --  48  --   BILITOT 1.2  --  0.5  --   PROT 6.7  --  5.0*  --   ALBUMIN 3.4* 2.5* 2.5*  2.5* 2.5*   Recent Labs  Lab 01/03/20 0245  LIPASE 31   No results for input(s): AMMONIA in the last 168 hours. Diabetic: No results for input(s): HGBA1C in the last 72 hours. No results for input(s): GLUCAP in the last 168 hours. Cardiac Enzymes: No results for  input(s): CKTOTAL, CKMB, CKMBINDEX, TROPONINI in the last 168 hours. No results for input(s): PROBNP in the last 8760 hours. Coagulation Profile: Recent Labs  Lab 12/31/19 1736  INR 1.1   Thyroid Function Tests: No results for input(s): TSH, T4TOTAL, FREET4, T3FREE, THYROIDAB in the last 72 hours. Lipid Profile: No results for input(s): CHOL, HDL, LDLCALC, TRIG, CHOLHDL, LDLDIRECT in the last 72 hours. Anemia Panel: Recent Labs    01/03/20 0934  VITAMINB12 449  FOLATE 8.0  FERRITIN 32  TIBC 297  IRON 67  RETICCTPCT 2.2   Urine analysis:    Component Value Date/Time   COLORURINE RED (A) 11/12/2012 1529   APPEARANCEUR HAZY (A) 11/12/2012 1529   LABSPEC 1.009 11/12/2012 1529   PHURINE 6.5 11/12/2012 1529   GLUCOSEU NEGATIVE 11/12/2012 1529   HGBUR LARGE (A) 11/12/2012 1529   BILIRUBINUR NEGATIVE 11/12/2012 1529   KETONESUR NEGATIVE 11/12/2012 1529   PROTEINUR NEGATIVE 11/12/2012 1529   UROBILINOGEN 0.2 11/12/2012 1529   NITRITE NEGATIVE 11/12/2012 1529   LEUKOCYTESUR NEGATIVE 11/12/2012 1529   Sepsis Labs: Invalid input(s): PROCALCITONIN, [place]  Microbiology: Recent Results (from the past 240 hour(s))  SARS CORONAVIRUS 2 (TAT 6-24 HRS) Nasopharyngeal Nasopharyngeal Swab     Status: None   Collection Time: 12/27/19  2:58 AM   Specimen: Nasopharyngeal Swab  Result Value Ref Range Status   SARS Coronavirus 2 NEGATIVE NEGATIVE Final    Comment: (NOTE) SARS-CoV-2 target nucleic acids are NOT DETECTED. The SARS-CoV-2 RNA is generally detectable in upper and lower respiratory specimens during the acute phase of infection. Negative results do not preclude SARS-CoV-2 infection, do not rule out co-infections with other pathogens, and should not be used as the sole basis for treatment or other patient management decisions. Negative results must be combined with clinical observations, patient history, and epidemiological information. The expected result is  Negative. Fact Sheet for Patients: SugarRoll.be Fact Sheet for Healthcare Providers: https://www.woods-mathews.com/ This test is not yet approved or cleared by the [place] FDA and  has been authorized for detection and/or diagnosis of SARS-CoV-2 by FDA under an Emergency Use Authorization (EUA). This EUA will remain  in effect (meaning this test can be used) for the duration of the COVID-19 declaration under  Section 56 4(b)(1) of the Act, 21 U.S.C. section 360bbb-3(b)(1), unless the authorization is terminated or revoked sooner. Performed at [facility], [place] [address] [place], [place] [address]   Culture, blood (routine x 2)     Status: None   Collection Time: 12/27/19  4:22 AM   Specimen: BLOOD  Result Value Ref Range Status   Specimen Description BLOOD RIGHT ARM  Final   Special Requests   Final    BOTTLES DRAWN AEROBIC AND ANAEROBIC Blood Culture adequate volume   Culture   Final    NO GROWTH 5 DAYS Performed at [facility], [address], [place], [place] [address]    Report Status 01/01/2020 FINAL  Final  Culture, blood (routine x 2)     Status: None   Collection Time: 12/27/19  4:22 AM   Specimen: BLOOD  Result Value Ref Range Status   Specimen Description BLOOD RIGHT HAND  Final   Special Requests   Final    BOTTLES DRAWN AEROBIC ONLY Blood Culture adequate volume   Culture   Final    NO GROWTH 5 DAYS Performed at [facility], [place] [address], [place], [place] [address]    Report Status 01/01/2020 FINAL  Final  SARS CORONAVIRUS 2 (TAT 6-24 HRS) Nasopharyngeal Nasopharyngeal Swab     Status: None   Collection Time: 12/31/19  9:31 PM   Specimen: Nasopharyngeal Swab  Result Value Ref Range Status   SARS Coronavirus 2 NEGATIVE NEGATIVE Final    Comment: (NOTE) SARS-CoV-2 target nucleic acids are NOT DETECTED. The SARS-CoV-2 RNA is generally detectable in upper and lower respiratory specimens during  the acute phase of infection. Negative results do not preclude SARS-CoV-2 infection, do not rule out co-infections with other pathogens, and should not be used as the sole basis for treatment or other patient management decisions. Negative results must be combined with clinical observations, patient history, and epidemiological information. The expected result is Negative. Fact Sheet for Patients: SugarRoll.be Fact Sheet for Healthcare Providers: https://www.woods-mathews.com/ This test is not yet approved or cleared by the [place] FDA and  has been authorized for detection and/or diagnosis of SARS-CoV-2 by FDA under an Emergency Use Authorization (EUA). This EUA will remain  in effect (meaning this test can be used) for the duration of the COVID-19 declaration under Section 56 4(b)(1) of the Act, 21 U.S.C. section 360bbb-3(b)(1), unless the authorization is terminated or revoked sooner. Performed at [facility], [place] [address] [place], [place] [address]   Culture, blood (routine x 2)     Status: None (Preliminary result)   Collection Time: 12/31/19 10:50 PM   Specimen: BLOOD LEFT FOREARM  Result Value Ref Range Status   Specimen Description BLOOD LEFT FOREARM  Final   Special Requests   Final    BOTTLES DRAWN AEROBIC AND ANAEROBIC Blood Culture adequate volume   Culture   Final    NO GROWTH 3 DAYS Performed at [facility], [place] [address], [place], [place] [address]    Report Status PENDING  Incomplete  Culture, blood (routine x 2)     Status: None (Preliminary result)   Collection Time: 12/31/19 11:15 PM   Specimen: BLOOD LEFT ARM  Result Value Ref Range Status   Specimen Description BLOOD LEFT ARM  Final   Special Requests   Final    BOTTLES DRAWN AEROBIC AND ANAEROBIC Blood Culture adequate volume   Culture   Final  NO GROWTH 3 DAYS Performed at [facility], [place] [address], [place], [place] [address]     Report Status PENDING  Incomplete    Radiology Studies: No results found.  Katherin Ramey T. [place]  If 7PM-7AM, please contact night-coverage [email or web address] Password [facility] 01/04/2020, 3:03 PM

## 2020-01-05 DIAGNOSIS — I951 Orthostatic hypotension: Secondary | ICD-10-CM

## 2020-01-05 DIAGNOSIS — R5381 Other malaise: Secondary | ICD-10-CM

## 2020-01-05 LAB — RENAL FUNCTION PANEL
Albumin: 2.7 g/dL — ABNORMAL LOW (ref 3.5–5.0)
Anion gap: 10 (ref 5–15)
BUN: 14 mg/dL (ref 8–23)
CO2: 26 mmol/L (ref 22–32)
Calcium: 7.8 mg/dL — ABNORMAL LOW (ref 8.9–10.3)
Chloride: 101 mmol/L (ref 98–111)
Creatinine, Ser: 0.94 mg/dL (ref 0.61–1.24)
GFR calc Af Amer: >60 mL/min (ref >60)
GFR calc non Af Amer: >60 mL/min (ref >60)
Glucose, Bld: 108 mg/dL — ABNORMAL HIGH (ref 70–99)
Phosphorus: 2.7 mg/dL (ref 2.5–4.6)
Potassium: 3.7 mmol/L (ref 3.5–5.1)
Sodium: 137 mmol/L (ref 135–145)

## 2020-01-05 LAB — CBC
HCT: 37 % — ABNORMAL LOW (ref 39.0–52.0)
Hemoglobin: 12.1 g/dL — ABNORMAL LOW (ref 13.0–17.0)
MCH: 30.6 pg (ref 26.0–34.0)
MCHC: 32.7 g/dL (ref 30.0–36.0)
MCV: 93.7 fL (ref 80.0–100.0)
Platelets: 266 10*3/uL (ref 150–400)
RBC: 3.95 MIL/uL — ABNORMAL LOW (ref 4.22–5.81)
RDW: 13.4 % (ref 11.5–15.5)
WBC: 13.6 10*3/uL — ABNORMAL HIGH (ref 4.0–10.5)
nRBC: 0 % (ref 0.0–0.2)

## 2020-01-05 LAB — MAGNESIUM: Magnesium: 2.2 mg/dL (ref 1.7–2.4)

## 2020-01-05 MED ORDER — DILTIAZEM HCL ER BEADS 180 MG PO CP24: 180.0000 mg | ORAL_CAPSULE | Freq: Every day | ORAL | 1 refills | Status: DC

## 2020-01-05 MED ORDER — ONDANSETRON 4 MG PO TBDP: 4.0000 mg | ORAL_TABLET | Freq: Four times a day (QID) | ORAL | 0 refills | Status: AC | PRN

## 2020-01-05 MED ORDER — ACETAMINOPHEN 500 MG PO TABS: 1000.0000 mg | ORAL_TABLET | Freq: Three times a day (TID) | ORAL | 1 refills | Status: AC

## 2020-01-05 MED ORDER — LISINOPRIL 20 MG PO TABS: 20.0000 mg | ORAL_TABLET | Freq: Every day | ORAL | 1 refills | Status: DC

## 2020-01-05 MED ORDER — DOCUSATE SODIUM 100 MG PO CAPS
100.0000 mg | ORAL_CAPSULE | Freq: Two times a day (BID) | ORAL | Status: DC
  Administered 2020-01-05: 100 mg via ORAL
  Filled 2020-01-05: qty 1

## 2020-01-05 MED ORDER — ATORVASTATIN CALCIUM 20 MG PO TABS: 40.0000 mg | ORAL_TABLET | Freq: Every day | ORAL | 1 refills | Status: AC

## 2020-01-05 MED ORDER — TAMSULOSIN HCL 0.4 MG PO CAPS: 0.4000 mg | ORAL_CAPSULE | Freq: Every day | ORAL | 1 refills | Status: DC

## 2020-01-05 NOTE — Progress Notes (Deleted)
   01/05/20 1347  Vital Signs  BP 116/64  BP Location Left Arm  BP Method Automatic  Patient Position (if appropriate) Sitting  Orthostatic Standing at 0 minutes  BP- Standing at 0 minutes 96/46  Pulse- Standing at 0 minutes 80  Orthostatic Standing at 3 minutes  BP- Standing at 3 minutes (!) 81/70  Pulse- Standing at 3 minutes 80

## 2020-01-05 NOTE — Progress Notes (Signed)
OT Cancellation Note  Patient Details Name: James [name] MRN: [id number] DOB: 1951-03-10   Cancelled Treatment:    Reason Eval/Treat Not Completed: Patient at procedure or test/ unavailable.  Pt currently working with PT.  Will reattempt this afternoon.   Nilsa Nutting., OTR/L Acute Rehabilitation Services Pager [phone] Office [phone]   Lucille Passy M 01/05/2020, 11:40 AM

## 2020-01-05 NOTE — Evaluation (Signed)
Occupational Therapy Evaluation Patient Details Name: James [name] MRN: [id number] DOB: 03-Jul-1951 Today's Date: 01/05/2020    History of Present Illness pt is a 69 yo male presenting with nausea and vomiting, work up showed small bowel obstructio recently admitted 3/27-3/30 for COPD exacerbation. PMH includes COPD, PAF on pradaxa, dementia, lung cancer s/p lobectomy, AAA, multiple abd surgery   Clinical Impression   .Pt admitted with the above listed diagnosis and demonstrates the below listed deficits.  He requires min A for ADLs and min guard to light min A for standing balance as he fatigues.   He did demonstrate orthostatic hypotension during OT session - he denied dizziness, but reported feeling very weak and needing to sit down.  He lives with his wife and was independent with ADLs PTA, but required assist for IADLs.  Recommend that pt sit to shower and have 24 hour supervision initially as well as HHOT.     01/05/20 1347  Vital Signs  BP 116/64  BP Location Left Arm  BP Method Automatic  Patient Position (if appropriate) Sitting  Orthostatic Standing at 0 minutes  BP- Standing at 0 minutes 96/46  Pulse- Standing at 0 minutes 80  Orthostatic Standing at 3 minutes  BP- Standing at 3 minutes (!) 81/70  Pulse- Standing at 3 minutes          Follow Up Recommendations  Home health OT;Supervision/Assistance - 24 hour    Equipment Recommendations  None recommended by OT    Recommendations for Other Services       Precautions / Restrictions Precautions Precautions: Fall Restrictions Weight Bearing Restrictions: No      Mobility Bed Mobility               General bed mobility comments: Pt up in the chair   Transfers Overall transfer level: Needs assistance Equipment used: Rolling walker (2 wheeled) Transfers: Sit to/from Stand Sit to Stand: Min guard         General transfer comment: Pt able to maintain standing x 5 mins with close min guard assist.   Upon return to sitting BP 99/55, 02 sats 91% on RA.  Orthostatic BPs taken afterwards, see below     Balance Overall balance assessment: Needs assistance Sitting-balance support: Single extremity supported Sitting balance-Leahy Scale: Fair Sitting balance - Comments: steady in recliner   Standing balance support: Bilateral upper extremity supported;During functional activity Standing balance-Leahy Scale: Fair Standing balance comment: Pt able to stand for brief periods of time with min guard assist, but fatigues requiring bil. UE support                            ADL either performed or assessed with clinical judgement   ADL Overall ADL's : Needs assistance/impaired Eating/Feeding: Independent   Grooming: Wash/dry hands;Wash/dry face;Oral care;Brushing hair;Minimal assistance;Standing   Upper Body Bathing: Set up;Supervision/ safety;Sitting   Lower Body Bathing: Minimal assistance;Sit to/from stand Lower Body Bathing Details (indicate cue type and reason): mild unsteadiness  Upper Body Dressing : Set up;Supervision/safety;Sitting   Lower Body Dressing: Minimal assistance;Sit to/from stand Lower Body Dressing Details (indicate cue type and reason): mild unsteadiness  Toilet Transfer: Min guard;Ambulation;Comfort height toilet;Regular Toilet;Grab bars;RW   Toileting- Clothing Manipulation and Hygiene: Sit to/from stand;Minimal assistance       Functional mobility during ADLs: Min guard;Rolling walker General ADL Comments: Pt fatigues with standing activities.  Instructed he and daughter that he should sit to shower  Vision Baseline Vision/History: Wears glasses Wears Glasses: At all times Patient Visual Report: No change from baseline       Perception Perception Perception Tested?: Yes   Praxis      Pertinent Vitals/Pain Pain Assessment: No/denies pain     Hand Dominance Right   Extremity/Trunk Assessment Upper Extremity Assessment Upper  Extremity Assessment: Generalized weakness(bil. UEs tremulous )   Lower Extremity Assessment Lower Extremity Assessment: Defer to PT evaluation       Communication Communication Communication: No difficulties   Cognition Arousal/Alertness: Awake/alert Behavior During Therapy: WFL for tasks assessed/performed Overall Cognitive Status: History of cognitive impairments - at baseline                                 General Comments: Pt with h/o dementia.  he is unable to provide accurate PLOF    General Comments  Daughter and son present during eval.  Orthostatic BP taken with 116/64 seated; standing 96/46; standing at 3 mins 81/70.  Pt denies dizziness, but reports feeling very weak and needing to sit down DOE 3/4 with sats 92% on RA     Exercises     Shoulder Instructions      Home Living Family/patient expects to be discharged to:: Private residence Living Arrangements: Spouse/significant other Available Help at Discharge: Family;Available 24 hours/day;Other (Comment) Type of Home: House Home Access: Stairs to enter [employer] of Steps: 2 stairs, 5 steps on front porch Entrance Stairs-Rails: None Home Layout: One level     Bathroom Shower/Tub: Teacher, early years/pre: Standard     Home Equipment: Environmental consultant - 2 wheels;Grab bars - tub/shower;Cane - single point   Additional Comments: Pt lives with spouse. Family will be available to provide 24 hour assist at discharge       Prior Functioning/Environment Level of Independence: Independent        Comments: requires assist for IADLs, medication management, etc.  He enjoys mowing his yard on a riding mower         OT Problem List: Decreased strength;Decreased activity tolerance;Impaired balance (sitting and/or standing);Decreased safety awareness;Decreased knowledge of use of DME or AE;Cardiopulmonary status limiting activity      OT Treatment/Interventions: Self-care/ADL  training;DME and/or AE instruction;Therapeutic activities;Cognitive remediation/compensation;Patient/family education;Balance training    OT Goals(Current goals can be found in the care plan section) Acute Rehab OT Goals Patient Stated Goal: to go home  OT Goal Formulation: All assessment and education complete, DC therapy  OT Frequency: Min 2X/week   Barriers to D/C: Decreased caregiver support          Co-evaluation              AM-PAC OT "6 Clicks" Daily Activity     Outcome Measure Help from another person eating meals?: None Help from another person taking care of personal grooming?: A Little Help from another person toileting, which includes using toliet, bedpan, or urinal?: A Little Help from another person bathing (including washing, rinsing, drying)?: A Little Help from another person to put on and taking off regular upper body clothing?: A Little Help from another person to put on and taking off regular lower body clothing?: A Little 6 Click Score: 19   End of Session Equipment Utilized During Treatment: Gait belt;Rolling walker Nurse Communication: Mobility status(Orthostatic BP )  Activity Tolerance: Patient limited by fatigue Patient left: in chair;with call bell/phone within reach;with family/visitor present  OT  Visit Diagnosis: Unsteadiness on feet (R26.81)                Time: 0211-1735 OT Time Calculation (min): 38 min Charges:  OT General Charges $OT Visit: 1 Visit OT Evaluation $OT Eval Moderate Complexity: 1 Mod OT Treatments $Self Care/Home Management : 8-22 mins $Therapeutic Activity: 8-22 mins  Nilsa Nutting., OTR/L Acute Rehabilitation Services Pager [phone] Office [phone]   Lucille Passy M 01/05/2020, 3:54 PM

## 2020-01-05 NOTE — Evaluation (Signed)
Physical Therapy Evaluation Patient Details Name: James [name] MRN: [id number] DOB: December 16, 1950 Today's Date: 01/05/2020   History of Present Illness  pt is a 69 yo male presenting with nausea and vomiting, work up showed small bowel obstructio recently admitted 3/27-3/30 for COPD exacerbation. PMH includes COPD, PAF on pradaxa, dementia, lung cancer s/p lobectomy, AAA, multiple abd surgery  Clinical Impression  Pt is a 69 yo male admitted for above. Pt received sitting up in recliner with daughter present. History obtained from pt and daughter as pt poor historian at baseline and presenting with some confusion this date. Pt lives with spouse in a one level home with 2 steps to enter. Pt previously ambulating ind without AD with no falls last six months. Pt required min guard assist throughout session for transfers and ambulation. Pt provided with RW for improved stability with no overt unsteadiness or LOB noted this session. Pt on room air throughout session. During ambulation unable to get O2 reading on pulse ox and dynamat also having difficulty getting accurate reading before getting 90% (of noted by the time reading occurred had been standing rest ~1 min). During return ambulation to room reassessed O2 sats on dynamat with reading 89% but with busy signal. Once returned to room and seated in recliner monitor in room reading 90% on RA. Pt with no outward signs of DOE and denied any reports of SOB when asked. Pt and daughter educated on basic fall prevention strategies in the home including reducing clutter especially on floors, removing throw rugs and using nightlights. Both pt and daughter educated on recommendation for cont use of RW for improved stability at least in the short term and recommendation for supervision/assist during mobility. Daughter and pts questions addressed. Pt presents with dec cognition, strength, balance, power and endurance limiting functional mobility. Pt would benefit from  further acute therapy to promote optimal return to PLOF. Recommendation for HHPT following hospital discharge for further mobility progression, improve independence and decrease fall risk. Daughter and pt in agreement.     Follow Up Recommendations Home health PT;Supervision for mobility/OOB    Equipment Recommendations  Rolling walker with 5" wheels;3in1 (PT)    Recommendations for Other Services       Precautions / Restrictions Precautions Precautions: Fall Restrictions Weight Bearing Restrictions: No      Mobility  Bed Mobility               General bed mobility comments: received sitting up in recliner and ended session in recliner  Transfers Overall transfer level: Needs assistance Equipment used: Rolling walker (2 wheeled) Transfers: Sit to/from Stand Sit to Stand: Min guard         General transfer comment: min guard to stand from recliner, cuing for hand placement and technique, overall steady  Ambulation/Gait Ambulation/Gait assistance: Min guard Gait Distance (Feet): 65 Feet Assistive device: Rolling walker (2 wheeled) Gait Pattern/deviations: Step-through pattern;Decreased stride length;Trunk flexed Gait velocity: dec   General Gait Details: pt ambulated out in hall with RW and min guard assist, pt with no overt LOB or unsteadiness, no outward signs of DOE and pt denying SOB when asked, unable to get accurate reading on pulse ox and by the time dynamat got reading standing rest of about 1 min, reading 90%, ambulated and reassed with dynamat reading 89% however busy signal, once back in room and in recliner monitor reading 90%  Science writer  Modified Rankin (Stroke Patients Only)       Balance Overall balance assessment: Needs assistance Sitting-balance support: Single extremity supported Sitting balance-Leahy Scale: Fair Sitting balance - Comments: steady in recliner   Standing balance support: Bilateral upper  extremity supported;During functional activity Standing balance-Leahy Scale: Fair Standing balance comment: reliant on RW for improved stability                             Pertinent Vitals/Pain Pain Assessment: No/denies pain    Home Living Family/patient expects to be discharged to:: Private residence Living Arrangements: Spouse/significant other Available Help at Discharge: Family;Available 24 hours/day;Other (Comment)(wife 24/7 some physical limitations, daughter reports big family so others could probably rotate to provide 24/7 as well) Type of Home: House Home Access: Stairs to enter Entrance Stairs-Rails: None Entrance Stairs-Number of Steps: 2 stairs, 5 steps on front porch Home Layout: One level Home Equipment: Walker - 2 wheels;Grab bars - tub/shower;Cane - single point      Prior Function Level of Independence: Independent         Comments: pt reports being independent, denies any recent falls, ambulates without AD, ind dressing and bathing, daughter confirms no falls, no AD use and that he has been doing well as far as bathing and dressing     Hand Dominance        Extremity/Trunk Assessment   Upper Extremity Assessment Upper Extremity Assessment: Generalized weakness;Defer to OT evaluation    Lower Extremity Assessment Lower Extremity Assessment: Generalized weakness       Communication   Communication: No difficulties  Cognition Arousal/Alertness: Awake/alert Behavior During Therapy: WFL for tasks assessed/performed Overall Cognitive Status: History of cognitive impairments - at baseline                                 General Comments: pt pleasant and cooperative, disoriented to time and place but able to pick hospital and year 2021 when provided options, oriented to self      General Comments      Exercises     Assessment/Plan    PT Assessment Patient needs continued PT services  PT Problem List Decreased  strength;Decreased mobility;Decreased range of motion;Decreased safety awareness;Decreased cognition;Decreased activity tolerance;Decreased balance;Decreased knowledge of use of DME;Cardiopulmonary status limiting activity       PT Treatment Interventions DME instruction;Therapeutic exercise;Gait training;Balance training;Stair training;Neuromuscular re-education;Functional mobility training;Therapeutic activities;Patient/family education;Cognitive remediation    PT Goals (Current goals can be found in the Care Plan section)  Acute Rehab PT Goals Patient Stated Goal: go home and get back to mowing and doing yard work PT Goal Formulation: With patient Time For Goal Achievement: 01/19/20 Potential to Achieve Goals: Fair    Frequency Min 3X/week   Barriers to discharge        Co-evaluation               AM-PAC PT "6 Clicks" Mobility  Outcome Measure Help needed turning from your back to your side while in a flat bed without using bedrails?: A Little Help needed moving from lying on your back to sitting on the side of a flat bed without using bedrails?: A Little Help needed moving to and from a bed to a chair (including a wheelchair)?: A Little Help needed standing up from a chair using your arms (e.g., wheelchair or bedside chair)?: A Little Help needed to walk  in hospital room?: A Little Help needed climbing 3-5 steps with a railing? : A Little 6 Click Score: 18    End of Session Equipment Utilized During Treatment: Gait belt Activity Tolerance: Patient tolerated treatment well Patient left: in chair;with call bell/phone within reach;with family/visitor present Nurse Communication: Mobility status PT Visit Diagnosis: Difficulty in walking, not elsewhere classified (R26.2);Unsteadiness on feet (R26.81);Muscle weakness (generalized) (M62.81)    Time: 9735-3299 PT Time Calculation (min) (ACUTE ONLY): 32 min   Charges:   PT Evaluation $PT Eval Moderate Complexity: 1  Mod PT Treatments $Therapeutic Activity: 8-22 mins       An Schnabel PT, DPT 1:12 PM,01/05/20    Lucifer Soja M Tashala Cumbo 01/05/2020, 1:06 PM

## 2020-01-05 NOTE — Progress Notes (Signed)
[facility] Progress Note     Subjective: Patient tolerating CLD and denies abdominal pain. Denies nausea or vomiting. Having BMs and passing flatus. Some low oxygen saturation on monitor this AM but likely probe malfunction - checked while I was present and O2 saturation was in the 90s.   ROS as above, otherwise negative  Objective: Vital signs in last 24 hours: Temp:  [97.9 F (36.6 C)-98.7 F (37.1 C)] 98.4 F (36.9 C) (04/06 0443) Pulse Rate:  [69-87] 69 (04/06 0443) Resp:  [17-19] 17 (04/06 0443) BP: (120-150)/(75-84) 120/75 (04/06 0443) SpO2:  [92 %-94 %] 94 % (04/06 0443) Last BM Date: 01/03/20  Intake/Output from previous day: 04/05 0701 - 04/06 0700 In: 1440 [P.O.:1440] Out: 1850 [Urine:1850] Intake/Output this shift: No intake/output data recorded.  PE: General: pleasant, WD, WN white male who is sitting up in NAD HEENT:   Sclera are noninjected.  PERRL.  Ears and nose without any masses or lesions.  Mouth is pink and moist Heart: regular, rate, and rhythm.  Normal s1,s2. No obvious murmurs, gallops, or rubs noted.  Palpable radial and pedal pulses bilaterally Lungs: CTAB, no wheezes, rhonchi, or rales noted.  Respiratory effort nonlabored, on supplemental oxygen  Abd: soft, NT, ND, +BS, no masses, hernias, or organomegaly MS: all 4 extremities are symmetrical with no cyanosis, clubbing, or edema. Skin: warm and dry with no masses, lesions, or rashes Neuro: Cranial nerves 2-12 grossly intact, sensation grossly intact throughout Psych: A&Ox3 with an appropriate affect.   Lab Results:  Recent Labs    01/04/20 0505 01/05/20 0645  WBC 13.8* 13.6*  HGB 12.0* 12.1*  HCT 37.6* 37.0*  PLT 274 266   BMET Recent Labs    01/04/20 0505 01/05/20 0645  NA 140 137  K 3.3* 3.7  CL 103 101  CO2 29 26  GLUCOSE 99 108*  BUN 20 14  CREATININE 0.82 0.94  CALCIUM 7.9* 7.8*   PT/INR No results for input(s): LABPROT, INR in the last 72 hours. CMP      Component Value Date/Time   NA 137 01/05/2020 0645   K 3.7 01/05/2020 0645   CL 101 01/05/2020 0645   CO2 26 01/05/2020 0645   GLUCOSE 108 (H) 01/05/2020 0645   BUN 14 01/05/2020 0645   CREATININE 0.94 01/05/2020 0645   CALCIUM 7.8 (L) 01/05/2020 0645   PROT 5.0 (L) 01/03/2020 0245   ALBUMIN 2.7 (L) 01/05/2020 0645   AST 16 01/03/2020 0245   ALT 15 01/03/2020 0245   ALKPHOS 48 01/03/2020 0245   BILITOT 0.5 01/03/2020 0245   GFRNONAA >60 01/05/2020 0645   GFRAA >60 01/05/2020 0645   Lipase     Component Value Date/Time   LIPASE 31 01/03/2020 0245       Studies/Results: No results found.  Anti-infectives: Anti-infectives (From admission, onward)   Start     Dose/Rate Route Frequency Ordered Stop   12/31/19 2230  cefTRIAXone (ROCEPHIN) 2 g in sodium chloride 0.9 % 100 mL IVPB  Status:  Discontinued     2 g 200 mL/hr over 30 Minutes Intravenous Every 24 hours 12/31/19 2219 01/05/20 0759   12/31/19 2230  azithromycin (ZITHROMAX) 500 mg in sodium chloride 0.9 % 250 mL IVPB  Status:  Discontinued     500 mg 250 mL/hr over 60 Minutes Intravenous Every 24 hours 12/31/19 2219 01/05/20 0759       Assessment/Plan SBO - SBO protocol with contrast in colon and rectum on films 4/4. -  having bowel function, tolerating CLD - advance to FLD - ok to go home on FLD and advance to soft over the next several days at home from a surgery standpoint - if patient stays overnight and tolerates FLD, can advance to soft diet in the AM and go home on soft diet - no indication for surgical intervention, we will sign off. Please call if questions or we can be of further assistance  FEN - FLD VTE - Lovenox ID - recephin/zithromax for CAP  LOS: 5 days    Brigid Re , [facility] 01/05/2020, 8:53 AM Please see Amion for pager number during day hours 7:00am-4:30pm

## 2020-01-05 NOTE — TOC Progression Note (Signed)
   Patient Details  Name: James [name] MRN: [id number] Date of Birth: 1951-09-14  Transition of Care [facility]) CM/SW Contact  Catia Todorov, Edson Snowball, RN Phone Number: 01/05/2020, 12:59 PM  Clinical Narrative:     Patient from home with wife. Daughter currently at bedside. Already has home oxygen at night and CPAP through [place] and [place].   PCP is Dr Tammi Klippel at [facility].   Discussed home health PT,OT,RN with patient and daughter . Provided medicare.gov list.   Explained family will be taught foley care prior to discharge. HHRN will not be able to make daily visits. Daughter voiced understanding. They want to use [employer] medicare to arrange home health. They chose Mered[facility] Ide with [facility] accepted referral.  Expected Discharge Plan: [place] Barriers to Discharge: Continued Medical Work up  Expected Discharge Plan and Services Expected Discharge Plan: [place] In-house Referral: Clinical Social Work Discharge Planning Services: CM Consult Post Acute Care Choice: Home Health, Durable Medical Equipment Living arrangements for the past 2 months: [place]                 DME Arranged: 3-N-1, Walker rolling DME Agency: [employer] Date DME Agency Contacted: 01/05/20 Time DME Agency Contacted: [phone] Representative spoke with at DME Agency: [facility] HH Arranged: RN, Disease Management, PT, OT [place] Agency: [place] Date [place]: 01/05/20 Time [place]: 1258 Representative spoke with at [place]: [place] Interventions    Readmission Risk Interventions Readmission Risk Prevention Plan 01/01/2020  Transportation Screening Complete  PCP or Specialist Appt within 5-7 Days Not Complete  Not Complete comments pending stability  Home Care Screening Complete  Medication Review (RN CM) Referral to Pharmacy  Some recent data might be hidden

## 2020-01-05 NOTE — Progress Notes (Signed)
Discharge education completed, including medications, foley care with tech back and follow up instruction. Patient and family verbalized understanding of education and have no additional questions. Up[ on discharge BP 121/57, HR 67, oxygen saturation 91% on RA

## 2020-01-05 NOTE — Discharge Summary (Signed)
Physician Discharge Summary  KORIN HARTWELL MCN:[id number] DOB: April 10, 1951 DOA: 12/31/2019  PCP: Ann Maki, MD  Admit date: 12/31/2019 Discharge date: 01/05/2020  Admitted From: Home Disposition: Home  Recommendations for Outpatient Follow-up:  1. Follow ups as below. 2. Follow-up with urology at the [facility] in 1 to 2 weeks 3. Please obtain CBC/BMP/Mag at follow up 4. Please follow up on the following pending results: None  Home Health: PT/OT/RN/aide Equipment/Devices: 3 and 1, rolling walker  Discharge Condition: Stable CODE STATUS: Full code  Follow-up Information    [facility].   Specialty: Home Health Services Why: Will provide home health  Contact information: [place] [place] [address] [phone]        Ann Maki, MD. Schedule an appointment as soon as possible for a visit in 1 week(s).   Specialty: Internal Medicine Contact information: [address] [place] [address] [phone]           Hospital Course: 69 year old male with history of COPD/chronic RF on 2 to 3 L, PAF on Pradaxa, dementia, lung cancer status post lobectomy, AAA, multiple abdominal surgery (laparotomy, colostomy and colostomy reversal in 2014) and recent hospitalization for COPD exacerbation from 3/27-3/30 presenting with nausea, vomiting and abdominal pain, and admitted for small bowel obstruction and possible pneumonia.  Last bowel movements about 4 days prior to admission.  In ED, in A. fib with mild RVR to 120.  WBC 15.3. Cr 1.14 (about baseline).  BUN 33.  K3.5.  AG 16.  CBC and CMP suggestive for dehydration.  EKG with A. fib and RVR to 120s.  FOBT negative.  COVID-19 negative.  CXR with LLL opacity and emphysematous changes. CT abdomen and pelvis revealed SBO with transition point in left mid abdomen likely related to underlying adhesions, and increased LLL consolidation. Patient was started on IV fluid,  ceftriaxone, azithromycin and antiemetics, and admitted for SBO and LLL pneumonia..  General surgery consulted.    SBO resolved with conservative care including NG tube and bowel rest.   Patient had acute urinary retention with bladder over distention the night of 4/2-4/3.  Foley catheter inserted for decompression and bladder rest.  Patient to follow-up with urology at the [facility] for voiding trial in 1 to 2 weeks.  Patient was evaluated by PT/OT who recommended home meds.  Accordingly, HH RN/PT/OT/aide ordered.  He was also noted to have orthostatic hypotension during evaluation by OT.  However, patient was not symptomatic.  He was prescribed TED hose in addition to an adjustment to his antihypertensive medications.  He was also counseled on a stepwise approach for get up and go.  See individual problem list below for more hospital course. Discharge Diagnoses:  SBO likely due to adhesion in patient with history of multiple bowel surgeries in 2014. -Resolved with conservative measures.  Tolerated full liquid diet. -Family to advance diet from full liquid to soft as tolerated  -Cleared for discharge by general surgery  Left lower lobe pneumonia: CXR and CT abdomen and pelvis concerning for worsening LLL consolidation.  Patient and family denies new respiratory symptoms.  However, patient is not a reliable historian. -Completed antibiotic course with ceftriaxone and azithromycin for 5 days.  Chronic COPD/chronic respiratory failure with recent exacerbation: On 3 L at baseline. -Ambulated and maintained appropriate saturation without oxygen. -Discharged on home breathing treatments  Paroxysmal A. fib with RVR: RVR resolved.  On Cardizem and Pradaxa at home. -Reduced Cardizem  CD from 240 mg daily to 180 mg daily in the setting of orthostatic hypotension -Resume home Pradaxa.  Orthostatic hypotension: Not symptomatic -TED hose.  Discontinued home lisinopril/HCTZ. -Reduced Cardizem CD as  above.  Dementia without behavioral disturbance: Stable. -Frequent reorientation and fall and delirium precautions. -Resumed home medications.   BPH with acute urinary tension and bladder over distention: had about 1000 cc on I&O cath the night of 4/2.  Has history of BPH. -Discharged with Foley catheter.  Outpatient follow-up with urology at the [facility] for voiding trial in 1 to 2 weeks -Continue home Proscar.  Did not add Flomax due to orthostatic hypotension.  Chronic pain-on Norco and gabapentin at home. -Encouraged to use his Tylenol around-the-clock and Norco for breakthrough pain.  Hyperlipidemia -Continue home statin.  Normocytic anemia: H&H stable.  Anemia panel normal. -Recheck CBC at follow-up  Hypokalemia: Resolved.  Discharge Instructions  Discharge Instructions    Call MD for:  difficulty breathing, headache or visual disturbances   Complete by: As directed    Call MD for:  persistant dizziness or light-headedness   Complete by: As directed    Call MD for:  persistant nausea and vomiting   Complete by: As directed    Call MD for:  temperature >100.4   Complete by: As directed    Diet full liquid   Complete by: As directed    You may advance to soft diet starting 01/06/2019 if tolerating full liquid diet.   Discharge instructions   Complete by: As directed    It has been a pleasure taking care of you! You were hospitalized twice small bowel obstruction and possible pneumonia.  You were treated for both conditions to the point we think it is safe to let you go home and follow-up with your doctors.  You also had acute urinary retention with bladder over distention which could be due to your prostate.  We have inserted Foley that should stay at least for a week or 2 until you follow-up with urology.  We recommend minimizing pain medication such as Norco or not taking it altogether if possible.  Medication such as Norco could increase your risk of urinary retention, low  blood pressure and fall.  You may take plain Tylenol around-the-clock to ease of the pain.  We have also stopped some of your blood pressure medications due to low blood pressure when you get up from sitting position.  We also recommend wearing your TED hose during the day.  Use a stepwise approach when you get up to go.  We have made some adjustment to your home medications during this hospitalization. Please review your new medication list and the directions before you take your medications.  Please follow-up with your primary care doctor in 1 to 2 weeks.  Also call to schedule a follow-up with urology in 1 to 2 weeks.   Take care,   Increase activity slowly   Complete by: As directed      Allergies as of 01/05/2020      Reactions   Amlodipine Besy-benazepril Hcl Shortness Of Breath   Meloxicam Nausea Only, Other (See Comments)   Stomach upset   Penicillins Rash   Has patient had a PCN reaction causing immediate rash, facial/tongue/throat swelling, SOB or lightheadedness with hypotension: No Has patient had a PCN reaction causing severe rash involving mucus membranes or skin necrosis: No Has patient had a PCN reaction that required hospitalization: No Has patient had a PCN reaction occurring within the  last 10 years: No If all of the above answers are "NO", then may proceed with Cephalosporin use.      Medication List    STOP taking these medications   lisinopril-hydrochlorothiazide 20-25 MG tablet Commonly known as: ZESTORETIC   naproxen sodium 220 MG tablet Commonly known as: ALEVE   oxyCODONE-acetaminophen 5-325 MG tablet Commonly known as: Roxicet   predniSONE 10 MG tablet Commonly known as: DELTASONE     TAKE these medications   acetaminophen 500 MG tablet Commonly known as: TYLENOL Take 2 tablets (1,000 mg total) by mouth every 8 (eight) hours.   albuterol (2.5 MG/3ML) 0.083% nebulizer solution Commonly known as: PROVENTIL Take 2.5 mg by nebulization every 6  (six) hours as needed for wheezing or shortness of breath.   albuterol 108 (90 Base) MCG/ACT inhaler Commonly known as: VENTOLIN HFA Inhale 2 puffs into the lungs every 6 (six) hours as needed for wheezing or shortness of breath.   atorvastatin 20 MG tablet Commonly known as: LIPITOR Take 2 tablets (40 mg total) by mouth daily at 6 PM. What changed:   medication strength  when to take this   budesonide-formoterol 160-4.5 MCG/ACT inhaler Commonly known as: SYMBICORT Inhale 2 puffs into the lungs 2 (two) times daily.   cetirizine 10 MG tablet Commonly known as: ZYRTEC Take 10 mg by mouth daily.   dabigatran 150 MG Caps capsule Commonly known as: PRADAXA Take 150 mg by mouth 2 (two) times daily.   diltiazem 180 MG 24 hr capsule Commonly known as: TIAZAC Take 1 capsule (180 mg total) by mouth daily. What changed:   medication strength  how much to take  when to take this   finasteride 5 MG tablet Commonly known as: PROSCAR Take 5 mg by mouth daily.   fluticasone 50 MCG/ACT nasal spray Commonly known as: FLONASE Place 2 sprays into both nostrils 2 (two) times daily as needed for allergies.   gabapentin 300 MG capsule Commonly known as: NEURONTIN Take 300-600 mg by mouth See admin instructions. Take 300 mg by mouth in the morning and 600 mg in the evening   guaiFENesin 600 MG 12 hr tablet Commonly known as: MUCINEX Take 600 mg by mouth in the morning, at noon, and at bedtime.   HYDROcodone-acetaminophen 10-325 MG tablet Commonly known as: NORCO Take 1 tablet by mouth every 6 (six) hours as needed (for pain).   memantine 10 MG tablet Commonly known as: NAMENDA Take 10 mg by mouth 2 (two) times daily.   omeprazole 20 MG tablet Commonly known as: PRILOSEC OTC Take 20 mg by mouth 2 (two) times daily as needed (Acid reflux).   ondansetron 4 MG disintegrating tablet Commonly known as: Zofran ODT Take 1 tablet (4 mg total) by mouth every 6 (six) hours as needed  for nausea or vomiting.   OXYGEN Inhale 2.5 L/min into the lungs continuous.   tiotropium 18 MCG inhalation capsule Commonly known as: SPIRIVA Place 18 mcg into inhaler and inhale daily.            Durable Medical Equipment  (From admission, onward)         Start     Ordered   01/05/20 1257  For home use only DME Walker rolling  Once    Question Answer Comment  Walker: With 5 Inch Wheels   Patient needs a walker to treat with the following condition Weakness      01/05/20 1257   01/05/20 1257  For home use only  DME 3 n 1  Once     01/05/20 1257          Consultations:  General surgery  Procedures/Studies:   CT CHEST WO CONTRAST  Result Date: 12/27/2019 CLINICAL DATA:  Pneumonia. History of lung malignancy. EXAM: CT CHEST WITHOUT CONTRAST TECHNIQUE: Multidetector CT imaging of the chest was performed following the standard protocol without IV contrast. COMPARISON:  None. FINDINGS: Cardiovascular: No significant vascular findings. Normal heart size. No pericardial effusion. Thoracic aortic atherosclerosis. Ascending thoracic aortic aneurysm measuring 4.5 cm in diameter at the level of main pulmonary artery. Coronary artery atherosclerosis. Mediastinum/Nodes: No enlarged mediastinal or axillary lymph nodes. Thyroid gland, trachea, and esophagus demonstrate no significant findings. Lungs/Pleura: Bibasilar scarring. Posttreatment changes in the right upper lobe from prior right upper lobectomy. 12 x 11 mm nodular soft tissue in the right lung apex adjacent to the surgical suture line which may reflect postsurgical changes versus recurrent malignancy. Stable right posterior pleural thickening unchanged compared with 01/30/2016. no focal consolidation, pleural effusion or pneumothorax. Emphysematous changes bilaterally. Upper Abdomen: No acute abnormality. Musculoskeletal: No chest wall mass or suspicious bone lesions identified. IMPRESSION: 1. No acute cardiopulmonary disease. 2.  Posttreatment changes in the right upper lobe from prior right upper lobectomy. 12 x 11 mm nodular soft tissue in the right lung apex adjacent to the surgical suture line which may reflect postsurgical changes versus recurrent malignancy. Recommend comparison with any prior imaging which may be available. 3.  Aortic Atherosclerosis (ICD10-I70.0). Emphysema (ICD10-J43.9). 4. Ascending aortic aneurysm measuring 4.5 cm in diameter. Ascending thoracic aortic aneurysm. Recommend semi-annual imaging followup by CTA or MRA and referral to cardiothoracic surgery if not already obtained. This recommendation follows 2010 ACCF/AHA/AATS/ACR/ASA/SCA/SCAI/SIR/STS/SVM Guidelines for the Diagnosis and Management of Patients With Thoracic Aortic Disease. Circulation. 2010; 121: K998-P382. Aortic aneurysm NOS (ICD10-I71.9) Electronically Signed   By: Kathreen Devoid   On: 12/27/2019 04:24   CT ABDOMEN PELVIS W CONTRAST  Result Date: 12/31/2019 CLINICAL DATA:  Abdominal pain and distension with coffee ground emesis EXAM: CT ABDOMEN AND PELVIS WITH CONTRAST TECHNIQUE: Multidetector CT imaging of the abdomen and pelvis was performed using the standard protocol following bolus administration of intravenous contrast. CONTRAST:  114mL OMNIPAQUE IOHEXOL 300 MG/ML  SOLN COMPARISON:  01/30/2016 CT of the abdomen, 12/27/2019, CT of the chest FINDINGS: Lower chest: Right lung base is within normal limits. Left lower lobe consolidation is noted increased from the prior exam of 4 days previous Hepatobiliary: No focal liver abnormality is seen. Status post cholecystectomy. No biliary dilatation. Pancreas: Unremarkable. No pancreatic ductal dilatation or surrounding inflammatory changes. Spleen: Multiple calcified granulomas are noted within the spleen stable from the prior exam. Adrenals/Urinary Tract: Adrenal glands are within normal limits. The kidneys demonstrate a normal enhancement pattern bilaterally. Some scarring in the left kidney is  seen. Normal excretion of contrast is noted. The bladder is well distended. Stomach/Bowel: Colon shows no obstructive or inflammatory changes. Changes of prior colonic resection are seen. The appendix is within normal limits. Stomach is distended with fluid as is the proximal jejunum. Multiple dilated loops of proximal small bowel are noted. There is a transition zone noted in the left mid abdomen best seen on images 39 through 41 of series 3 and image 56 of series 6. Given the patient's clinical history of prior colostomy and reversal these changes are likely related to adhesions. The more distal small bowel appears within normal limits. Vascular/Lymphatic: Aortic atherosclerosis. No enlarged abdominal or pelvic lymph nodes. Reproductive: Prostate  is unremarkable. Other: No abdominal wall hernia or abnormality. No abdominopelvic ascites. Musculoskeletal: Degenerative changes of lumbar spine are noted. IMPRESSION: Small-bowel obstruction with a transition point in the left mid abdomen likely related underlying adhesions. Changes of prior colostomy reversal. Left lower lobe consolidation increased from the prior exam. Electronically Signed   By: Inez Catalina M.D.   On: 12/31/2019 20:41   DG Chest Portable 1 View  Result Date: 12/31/2019 CLINICAL DATA:  Cough EXAM: PORTABLE CHEST 1 VIEW COMPARISON:  12/27/2019, CT chest 12/27/2019 FINDINGS: Emphysematous disease. Increased opacity at the left base since prior. Possible small left effusion. Post lobectomy changes on the right. Right apical opacity corresponding to CT soft tissue density. Stable cardiomediastinal silhouette. No pneumothorax. IMPRESSION: 1. Hyperinflated lungs with emphysematous disease and postsurgical changes on the right. Right apical soft tissue abnormality is better demonstrated on the recent chest CT. 2. Increased atelectasis or mild pneumonia at the left base with possible small left effusion. Electronically Signed   By: Donavan Foil M.D.    On: 12/31/2019 18:20   DG Chest Port 1 View  Result Date: 12/27/2019 CLINICAL DATA:  Cough and fever. Wheezing. EXAM: PORTABLE CHEST 1 VIEW COMPARISON:  Radiograph 07/07/2018 FINDINGS: Lungs are hyperinflated with emphysema. Postsurgical change in the right hemithorax with surgical clips and sutures at the hilum mid and upper lung zone. Heart is normal in size with normal mediastinal contours. Streaky opacities at the left lung base most consistent with atelectasis. No confluent airspace disease. Mild biapical pleuroparenchymal scarring. No pneumothorax or large pleural effusion. No acute osseous abnormalities are seen. IMPRESSION: 1. Streaky left lung base opacities most consistent with atelectasis. 2. Emphysema with postsurgical change in the right hemithorax. Electronically Signed   By: Keith Rake M.D.   On: 12/27/2019 00:55   DG Abd 2 Views  Result Date: 01/01/2020 CLINICAL DATA:  Small-bowel obstruction. EXAM: ABDOMEN - 2 VIEW COMPARISON:  CT abdomen/pelvis 12/31/2019 FINDINGS: Persistently dilated small bowel loops measuring up to 6.3 cm in diameter. Multiple small bowel air-fluid levels are demonstrated on upright radiographs. No evidence of free air. Moderate stool burden within the right colon. Right common iliac artery stent. Contrast opacifies the urinary bladder. Surgical clips within the right upper quadrant of the abdomen. IMPRESSION: Findings consistent with persistent small-bowel obstruction. No evidence of free air. Electronically Signed   By: Kellie Simmering DO   On: 01/01/2020 08:31   DG Abd Portable 1V  Result Date: 01/03/2020 CLINICAL DATA:  Small-bowel obstruction.  Abdominal distension. EXAM: PORTABLE ABDOMEN - 1 VIEW COMPARISON:  01/02/2020 and earlier exams. FINDINGS: Mild dilation of small bowel. Air and contrast is no sigmoid colon and rectum. Mild colonic distention. Nasal/orogastric tube curls in the left upper quadrant, stable. Right common iliac artery vascular stent is  also stable. IMPRESSION: 1. Mild small bowel dilation with mild distension of the colon. Contrast lies in the colon and rectum. The degree of small-bowel dilation is significantly decreased when compared to the prior radiographs. Findings consistent with improved small-bowel obstruction. Electronically Signed   By: Lajean Manes M.D.   On: 01/03/2020 07:16   DG Abd Portable 1V  Result Date: 01/03/2020 CLINICAL DATA:  NG tube placement EXAM: PORTABLE ABDOMEN - 1 VIEW COMPARISON:  January 02, 2020 FINDINGS: The NG tube projects over the left upper quadrant, similar to prior study. Partially visualized dilated loops of small bowel are noted in the upper abdomen. There are small bilateral pleural effusions with adjacent atelectasis, left greater than right.  Vascular congestion is noted. IMPRESSION: 1. NG tube as above. 2. Dilated loops of small bowel are noted in the upper abdomen. 3. There are small bilateral pleural effusions with adjacent atelectasis. Electronically Signed   By: Constance Holster M.D.   On: 01/03/2020 03:02   DG Abd Portable 1V-Small Bowel Obstruction Protocol-initial, 8 hr delay  Result Date: 01/02/2020 CLINICAL DATA:  Bowel obstruction. EXAM: PORTABLE ABDOMEN - 1 VIEW COMPARISON:  01/01/2020 FINDINGS: The enteric tube projects over the left upper quadrant. Multiple dilated loops of small bowel are again noted throughout the abdomen. The majority of the oral contrast remains in the small bowel. There is no convincing oral contrast within the colon. There is a large amount of stool in the ascending colon. There is no definite pneumoperitoneum. No pneumatosis. IMPRESSION: Multiple dilated loops of small bowel throughout the abdomen. The majority of the oral contrast remains in the small bowel. Electronically Signed   By: Constance Holster M.D.   On: 01/02/2020 03:25   DG Abd Portable 1V-Small Bowel Protocol-Position Verification  Result Date: 01/01/2020 CLINICAL DATA:  NG tube placement EXAM:  PORTABLE ABDOMEN - 1 VIEW COMPARISON:  Abdominal radiographs, 01/01/2020 FINDINGS: Esophagogastric tube is positioned with tip and side port below the diaphragm. Redemonstrated gas-filled loops of bowel in the included upper abdomen. No free air. IMPRESSION: Esophagogastric tube with tip and side port below the diaphragm. Electronically Signed   By: Eddie Candle M.D.   On: 01/01/2020 17:49      Discharge Exam: Vitals:   01/05/20 1347 01/05/20 1527  BP: 116/64 (!) 121/57  Pulse:  67  Resp:    Temp:    SpO2:  100%    GENERAL: No acute distress.  Appears well.  HEENT: MMM.  Vision and hearing grossly intact.  NECK: Supple.  No apparent JVD.  RESP: On room air.  No IWOB.  Fair aeration bilaterally. CVS:  RRR. Heart sounds normal.  ABD/GI/GU: Bowel sounds present. Soft. Non tender.  MSK/EXT:  Moves extremities. No apparent deformity or edema.  SKIN: no apparent skin lesion or wound NEURO: Awake, alert and oriented self, partial place and family.  No apparent focal neuro deficit. PSYCH: Calm. Normal affect.   The results of significant diagnostics from this hospitalization (including imaging, microbiology, ancillary and laboratory) are listed below for reference.     Microbiology: Recent Results (from the past 240 hour(s))  SARS CORONAVIRUS 2 (TAT 6-24 HRS) Nasopharyngeal Nasopharyngeal Swab     Status: None   Collection Time: 12/27/19  2:58 AM   Specimen: Nasopharyngeal Swab  Result Value Ref Range Status   SARS Coronavirus 2 NEGATIVE NEGATIVE Final    Comment: (NOTE) SARS-CoV-2 target nucleic acids are NOT DETECTED. The SARS-CoV-2 RNA is generally detectable in upper and lower respiratory specimens during the acute phase of infection. Negative results do not preclude SARS-CoV-2 infection, do not rule out co-infections with other pathogens, and should not be used as the sole basis for treatment or other patient management decisions. Negative results must be combined with  clinical observations, patient history, and epidemiological information. The expected result is Negative. Fact Sheet for Patients: SugarRoll.be Fact Sheet for Healthcare Providers: https://www.woods-mathews.com/ This test is not yet approved or cleared by the [place] FDA and  has been authorized for detection and/or diagnosis of SARS-CoV-2 by FDA under an Emergency Use Authorization (EUA). This EUA will remain  in effect (meaning this test can be used) for the duration of the COVID-19 declaration under Section 56  4(b)(1) of the Act, 21 U.S.C. section 360bbb-3(b)(1), unless the authorization is terminated or revoked sooner. Performed at [facility], [place] [address], [place], [place] [address]   Culture, blood (routine x 2)     Status: None   Collection Time: 12/27/19  4:22 AM   Specimen: BLOOD  Result Value Ref Range Status   Specimen Description BLOOD RIGHT ARM  Final   Special Requests   Final    BOTTLES DRAWN AEROBIC AND ANAEROBIC Blood Culture adequate volume   Culture   Final    NO GROWTH 5 DAYS Performed at [facility], [address], [place], [place] [address]    Report Status 01/01/2020 FINAL  Final  Culture, blood (routine x 2)     Status: None   Collection Time: 12/27/19  4:22 AM   Specimen: BLOOD  Result Value Ref Range Status   Specimen Description BLOOD RIGHT HAND  Final   Special Requests   Final    BOTTLES DRAWN AEROBIC ONLY Blood Culture adequate volume   Culture   Final    NO GROWTH 5 DAYS Performed at [facility], [place] [address], [place], [place] [address]    Report Status 01/01/2020 FINAL  Final  SARS CORONAVIRUS 2 (TAT 6-24 HRS) Nasopharyngeal Nasopharyngeal Swab     Status: None   Collection Time: 12/31/19  9:31 PM   Specimen: Nasopharyngeal Swab  Result Value Ref Range Status   SARS Coronavirus 2 NEGATIVE NEGATIVE Final    Comment: (NOTE) SARS-CoV-2 target nucleic acids are NOT  DETECTED. The SARS-CoV-2 RNA is generally detectable in upper and lower respiratory specimens during the acute phase of infection. Negative results do not preclude SARS-CoV-2 infection, do not rule out co-infections with other pathogens, and should not be used as the sole basis for treatment or other patient management decisions. Negative results must be combined with clinical observations, patient history, and epidemiological information. The expected result is Negative. Fact Sheet for Patients: SugarRoll.be Fact Sheet for Healthcare Providers: https://www.woods-mathews.com/ This test is not yet approved or cleared by the [place] FDA and  has been authorized for detection and/or diagnosis of SARS-CoV-2 by FDA under an Emergency Use Authorization (EUA). This EUA will remain  in effect (meaning this test can be used) for the duration of the COVID-19 declaration under Section 56 4(b)(1) of the Act, 21 U.S.C. section 360bbb-3(b)(1), unless the authorization is terminated or revoked sooner. Performed at [facility], [place] [address], [place], [place] [address]   Culture, blood (routine x 2)     Status: None (Preliminary result)   Collection Time: 12/31/19 10:50 PM   Specimen: BLOOD LEFT FOREARM  Result Value Ref Range Status   Specimen Description BLOOD LEFT FOREARM  Final   Special Requests   Final    BOTTLES DRAWN AEROBIC AND ANAEROBIC Blood Culture adequate volume   Culture   Final    NO GROWTH 4 DAYS Performed at [facility], [place] [address] [place],  [address]    Report Status PENDING  Incomplete  Culture, blood (routine x 2)     Status: None (Preliminary result)   Collection Time: 12/31/19 11:15 PM   Specimen: BLOOD LEFT ARM  Result Value Ref Range Status   Specimen Description BLOOD LEFT ARM  Final   Special Requests   Final    BOTTLES DRAWN AEROBIC AND ANAEROBIC Blood Culture adequate volume   Culture    Final  NO GROWTH 4 DAYS Performed at [facility], [place] [address], [place],  [address]    Report Status PENDING  Incomplete     Labs: BNP (last 3 results) No results for input(s): BNP in the last 8760 hours. Basic Metabolic Panel: Recent Labs  Lab 12/31/19 1736 12/31/19 2252 01/01/20 0306 01/02/20 0412 01/03/20 0245 01/04/20 0505 01/05/20 0645  NA   < >  --  137 145 144 140 137  K   < >  --  3.9 3.7 3.4* 3.3* 3.7  CL   < >  --  94* 104 106 103 101  CO2   < >  --  31 30 29 29 26   GLUCOSE   < >  --  128* 151* 110* 99 108*  BUN   < >  --  32* 33* 28* 20 14  CREATININE   < >  --  1.11 0.99 0.88 0.82 0.94  CALCIUM   < >  --  8.8* 8.6* 8.3* 7.9* 7.8*  MG  --  2.0  --  2.0 2.0 1.8 2.2  PHOS  --   --   --  3.7 2.4* 2.4* 2.7   < > = values in this interval not displayed.   Liver Function Tests: Recent Labs  Lab 12/31/19 1736 01/02/20 0412 01/03/20 0245 01/04/20 0505 01/05/20 0645  AST 21  --  16  --   --   ALT 17  --  15  --   --   ALKPHOS 67  --  48  --   --   BILITOT 1.2  --  0.5  --   --   PROT 6.7  --  5.0*  --   --   ALBUMIN 3.4* 2.5* 2.5*  2.5* 2.5* 2.7*   Recent Labs  Lab 01/03/20 0245  LIPASE 31   No results for input(s): AMMONIA in the last 168 hours. CBC: Recent Labs  Lab 01/01/20 0306 01/02/20 0412 01/03/20 0245 01/04/20 0505 01/05/20 0645  WBC 12.9* 12.9* 13.1* 13.8* 13.6*  HGB 12.5* 10.7* 11.2* 12.0* 12.1*  HCT 38.4* 33.2* 34.1* 37.6* 37.0*  MCV 92.8 94.1 95.0 93.1 93.7  PLT 281 245 252 274 266   Cardiac Enzymes: No results for input(s): CKTOTAL, CKMB, CKMBINDEX, TROPONINI in the last 168 hours. BNP: Invalid input(s): POCBNP CBG: No results for input(s): GLUCAP in the last 168 hours. D-Dimer No results for input(s): DDIMER in the last 72 hours. Hgb A1c No results for input(s): HGBA1C in the last 72 hours. Lipid Profile No results for input(s): CHOL, HDL, LDLCALC, TRIG, CHOLHDL, LDLDIRECT in the last 72 hours. Thyroid  function studies No results for input(s): TSH, T4TOTAL, T3FREE, THYROIDAB in the last 72 hours.  Invalid input(s): FREET3 Anemia work up Recent Labs    01/03/20 0934  VITAMINB12 449  FOLATE 8.0  FERRITIN 32  TIBC 297  IRON 67  RETICCTPCT 2.2   Urinalysis    Component Value Date/Time   COLORURINE RED (A) 11/12/2012 1529   APPEARANCEUR HAZY (A) 11/12/2012 1529   LABSPEC 1.009 11/12/2012 1529   PHURINE 6.5 11/12/2012 1529   GLUCOSEU NEGATIVE 11/12/2012 1529   HGBUR LARGE (A) 11/12/2012 1529   BILIRUBINUR NEGATIVE 11/12/2012 1529   KETONESUR NEGATIVE 11/12/2012 1529   PROTEINUR NEGATIVE 11/12/2012 1529   UROBILINOGEN 0.2 11/12/2012 1529   NITRITE NEGATIVE 11/12/2012 1529   LEUKOCYTESUR NEGATIVE 11/12/2012 1529   Sepsis Labs Invalid input(s): PROCALCITONIN,  WBC,  LACTICIDVEN  Time coordinating discharge: 45 minutes  SIGNED:  Mercy Riding, MD  [facility] 01/05/2020, 3:39 PM  If 7PM-7AM, please contact night-coverage [email or web address] Password [email or web address]

## 2020-01-06 LAB — CULTURE, BLOOD (ROUTINE X 2)
Culture: NO GROWTH
Culture: NO GROWTH
Special Requests: ADEQUATE
Special Requests: ADEQUATE

## 2020-03-05 ENCOUNTER — Encounter (HOSPITAL_COMMUNITY): Payer: Self-pay

## 2020-03-05 ENCOUNTER — Observation Stay (HOSPITAL_COMMUNITY)
Admission: EM | Admit: 2020-03-05 | Discharge: 2020-03-06 | Disposition: A | Discharge status: Home / Self Care | Payer: No Typology Code available for payment source | Attending: Internal Medicine | Admitting: Internal Medicine

## 2020-03-05 ENCOUNTER — Other Ambulatory Visit: Payer: Self-pay

## 2020-03-05 DIAGNOSIS — E785 Hyperlipidemia, unspecified: Secondary | ICD-10-CM | POA: Insufficient documentation

## 2020-03-05 DIAGNOSIS — I959 Hypotension, unspecified: Principal | ICD-10-CM | POA: Insufficient documentation

## 2020-03-05 DIAGNOSIS — G4733 Obstructive sleep apnea (adult) (pediatric): Secondary | ICD-10-CM | POA: Diagnosis not present

## 2020-03-05 DIAGNOSIS — N400 Enlarged prostate without lower urinary tract symptoms: Secondary | ICD-10-CM | POA: Insufficient documentation

## 2020-03-05 DIAGNOSIS — E7800 Pure hypercholesterolemia, unspecified: Secondary | ICD-10-CM | POA: Insufficient documentation

## 2020-03-05 DIAGNOSIS — E871 Hypo-osmolality and hyponatremia: Secondary | ICD-10-CM | POA: Diagnosis not present

## 2020-03-05 DIAGNOSIS — Z87891 Personal history of nicotine dependence: Secondary | ICD-10-CM | POA: Insufficient documentation

## 2020-03-05 DIAGNOSIS — J449 Chronic obstructive pulmonary disease, unspecified: Secondary | ICD-10-CM

## 2020-03-05 DIAGNOSIS — I10 Essential (primary) hypertension: Secondary | ICD-10-CM | POA: Diagnosis not present

## 2020-03-05 DIAGNOSIS — F0390 Unspecified dementia without behavioral disturbance: Secondary | ICD-10-CM | POA: Insufficient documentation

## 2020-03-05 DIAGNOSIS — D649 Anemia, unspecified: Secondary | ICD-10-CM | POA: Diagnosis not present

## 2020-03-05 DIAGNOSIS — I480 Paroxysmal atrial fibrillation: Secondary | ICD-10-CM | POA: Insufficient documentation

## 2020-03-05 DIAGNOSIS — Z880 Allergy status to penicillin: Secondary | ICD-10-CM | POA: Diagnosis not present

## 2020-03-05 DIAGNOSIS — I739 Peripheral vascular disease, unspecified: Secondary | ICD-10-CM | POA: Insufficient documentation

## 2020-03-05 DIAGNOSIS — Z95820 Peripheral vascular angioplasty status with implants and grafts: Secondary | ICD-10-CM | POA: Insufficient documentation

## 2020-03-05 DIAGNOSIS — K219 Gastro-esophageal reflux disease without esophagitis: Secondary | ICD-10-CM | POA: Diagnosis not present

## 2020-03-05 DIAGNOSIS — D72829 Elevated white blood cell count, unspecified: Secondary | ICD-10-CM | POA: Diagnosis not present

## 2020-03-05 DIAGNOSIS — Z823 Family history of stroke: Secondary | ICD-10-CM | POA: Insufficient documentation

## 2020-03-05 DIAGNOSIS — Z85118 Personal history of other malignant neoplasm of bronchus and lung: Secondary | ICD-10-CM | POA: Diagnosis not present

## 2020-03-05 DIAGNOSIS — Z7901 Long term (current) use of anticoagulants: Secondary | ICD-10-CM | POA: Insufficient documentation

## 2020-03-05 DIAGNOSIS — Z7951 Long term (current) use of inhaled steroids: Secondary | ICD-10-CM | POA: Insufficient documentation

## 2020-03-05 DIAGNOSIS — Z888 Allergy status to other drugs, medicaments and biological substances status: Secondary | ICD-10-CM | POA: Insufficient documentation

## 2020-03-05 DIAGNOSIS — Z20822 Contact with and (suspected) exposure to covid-19: Secondary | ICD-10-CM | POA: Insufficient documentation

## 2020-03-05 DIAGNOSIS — R739 Hyperglycemia, unspecified: Secondary | ICD-10-CM | POA: Diagnosis not present

## 2020-03-05 DIAGNOSIS — F0391 Unspecified dementia with behavioral disturbance: Secondary | ICD-10-CM | POA: Insufficient documentation

## 2020-03-05 DIAGNOSIS — N179 Acute kidney failure, unspecified: Secondary | ICD-10-CM | POA: Insufficient documentation

## 2020-03-05 DIAGNOSIS — E860 Dehydration: Secondary | ICD-10-CM | POA: Diagnosis not present

## 2020-03-05 DIAGNOSIS — Z8042 Family history of malignant neoplasm of prostate: Secondary | ICD-10-CM | POA: Insufficient documentation

## 2020-03-05 DIAGNOSIS — Z8505 Personal history of malignant neoplasm of liver: Secondary | ICD-10-CM | POA: Diagnosis not present

## 2020-03-05 DIAGNOSIS — Z79899 Other long term (current) drug therapy: Secondary | ICD-10-CM | POA: Insufficient documentation

## 2020-03-05 DIAGNOSIS — Z836 Family history of other diseases of the respiratory system: Secondary | ICD-10-CM | POA: Insufficient documentation

## 2020-03-05 DIAGNOSIS — Z8249 Family history of ischemic heart disease and other diseases of the circulatory system: Secondary | ICD-10-CM | POA: Insufficient documentation

## 2020-03-05 DIAGNOSIS — L8990 Pressure ulcer of unspecified site, unspecified stage: Secondary | ICD-10-CM | POA: Insufficient documentation

## 2020-03-05 LAB — COMPREHENSIVE METABOLIC PANEL
ALT: 14 U/L (ref 0–44)
AST: 18 U/L (ref 15–41)
Albumin: 3.2 g/dL — ABNORMAL LOW (ref 3.5–5.0)
Alkaline Phosphatase: 93 U/L (ref 38–126)
Anion gap: 16 — ABNORMAL HIGH (ref 5–15)
BUN: 37 mg/dL — ABNORMAL HIGH (ref 8–23)
CO2: 23 mmol/L (ref 22–32)
Calcium: 8.2 mg/dL — ABNORMAL LOW (ref 8.9–10.3)
Chloride: 96 mmol/L — ABNORMAL LOW (ref 98–111)
Creatinine, Ser: 1.66 mg/dL — ABNORMAL HIGH (ref 0.61–1.24)
GFR calc Af Amer: 48 mL/min — ABNORMAL LOW (ref >60)
GFR calc non Af Amer: 41 mL/min — ABNORMAL LOW (ref >60)
Glucose, Bld: 173 mg/dL — ABNORMAL HIGH (ref 70–99)
Potassium: 4.6 mmol/L (ref 3.5–5.1)
Sodium: 135 mmol/L (ref 135–145)
Total Bilirubin: 0.5 mg/dL (ref 0.3–1.2)
Total Protein: 6.1 g/dL — ABNORMAL LOW (ref 6.5–8.1)

## 2020-03-05 LAB — CBC WITH DIFFERENTIAL/PLATELET
Abs Immature Granulocytes: 0.06 10*3/uL (ref 0.00–0.07)
Basophils Absolute: 0 10*3/uL (ref 0.0–0.1)
Basophils Relative: 0 %
Eosinophils Absolute: 0.2 10*3/uL (ref 0.0–0.5)
Eosinophils Relative: 2 %
HCT: 33.6 % — ABNORMAL LOW (ref 39.0–52.0)
Hemoglobin: 10.3 g/dL — ABNORMAL LOW (ref 13.0–17.0)
Immature Granulocytes: 1 %
Lymphocytes Relative: 9 %
Lymphs Abs: 1.2 10*3/uL (ref 0.7–4.0)
MCH: 26.8 pg (ref 26.0–34.0)
MCHC: 30.7 g/dL (ref 30.0–36.0)
MCV: 87.3 fL (ref 80.0–100.0)
Monocytes Absolute: 0.5 10*3/uL (ref 0.1–1.0)
Monocytes Relative: 4 %
Neutro Abs: 10.7 10*3/uL — ABNORMAL HIGH (ref 1.7–7.7)
Neutrophils Relative %: 84 %
Platelets: 247 10*3/uL (ref 150–400)
RBC: 3.85 MIL/uL — ABNORMAL LOW (ref 4.22–5.81)
RDW: 16.3 % — ABNORMAL HIGH (ref 11.5–15.5)
WBC: 12.6 10*3/uL — ABNORMAL HIGH (ref 4.0–10.5)
nRBC: 0 % (ref 0.0–0.2)

## 2020-03-05 LAB — CBG MONITORING, ED: Glucose-Capillary: 156 mg/dL — ABNORMAL HIGH (ref 70–99)

## 2020-03-05 LAB — SARS CORONAVIRUS 2 BY RT PCR (HOSPITAL ORDER, PERFORMED IN ~~LOC~~ HOSPITAL LAB): SARS Coronavirus 2: NEGATIVE

## 2020-03-05 MED ORDER — SODIUM CHLORIDE 0.9 % IV BOLUS
1000.0000 mL | Freq: Once | INTRAVENOUS | Status: AC
  Administered 2020-03-05: 1000 mL via INTRAVENOUS

## 2020-03-05 NOTE — ED Provider Notes (Addendum)
[facility] EMERGENCY DEPARTMENT Provider Note   CSN: [id number] Arrival date & time: 03/05/20  1908     History Chief Complaint  Patient presents with  . Weakness    James [name] is a 69 y.o. male.  Who presents emergency department with a chief complaint of presyncope.  There is history of dementia and therefore a level 5 caveat.  History is provided by the daughter at bedside.  Patient's been having multiple episodes of presyncope and lightheadedness.  She has been able to take his blood pressure at home during these events and states that he is frequently hypotensive.  He is on multiple hypertension medications and recently had losartan added to the medication list.  Today the patient was sitting down eating when he had onset of lightheadedness and presyncope.  She brought him into the emergency department today for evaluation.  She states that about 2 weeks ago he had 5 days of severe diarrhea, lying in bed.  He did make a lot of urine during that time.  She states that he seemed to get better but that is when his hypotensive episodes began.  He denies any racing or skipping in his heart thank you melena or hematochezia.  Patient has not been having any fever chills or shortness of breath.  HPI     Past Medical History:  Diagnosis Date  . Aorta aneurysm (HCC)    4.2 ascending thoracic aortic aneursym without evidence of abdominal aortic aneurysm on CTA 11/07/12  . Aortic aneurysm ([place])    "unrepaired; has it checked q yr" (05/12/2013)  . BPH (benign prostatic hypertrophy)   . Cancer of lung [facility])    s/p resection in 2005 at [facility] / [place] patient  . Chronic back pain started in 1990's  . Chronic lower back pain   . Chronic respiratory failure with hypoxia, on home O2 therapy (HCC)    3lpm with exertion  . Colitis   . COPD (chronic obstructive pulmonary disease) ([place])   . Exertional shortness of breath   . Exposure to Agent Orange    [place] War  . GERD  (gastroesophageal reflux disease)   . High cholesterol   . Hypertension   . Liver cancer ([place]) 2017   treated at [facility]  . LUNG CANCER, HX OF 11/01/2009   Qualifier: Diagnosis of  By: Ronnald Ramp MD, Arvid Right.   . OSA on CPAP   . PAD (peripheral artery disease) [facility])     Patient Active Problem List   Diagnosis Date Noted  . Hyponatremia 12/27/2019  . Pneumonia of left lower lobe due to infectious organism 12/27/2019  . Dementia with behavioral disturbance ([place]) 12/27/2019  . COPD exacerbation ([place]) 12/27/2019  . SBO (small bowel obstruction) ([place]) 01/30/2016  . Pre-operative cardiovascular examination, supraventricular arrhythmia 04/04/2013  . Shortness of breath   . Post-operative state 12/02/2012  . Ischemic colitis ([place]) 11/13/2012  . Paroxysmal atrial fibrillation ([place]) 11/07/2012  . COPD with acute exacerbation ([place]) 11/07/2012  . Elevated alkaline phosphatase level 11/07/2012  . HYPERLIPIDEMIA 11/01/2009  . HYPERTENSION 11/01/2009  . PERIPHERAL VASCULAR DISEASE 11/01/2009  . GERD 11/01/2009  . BENIGN PROSTATIC HYPERTROPHY 11/01/2009  . HERNIATED LUMBAR DISC 11/01/2009  . LOW BACK PAIN 11/01/2009  . LUNG CANCER, HX OF 11/01/2009  . DIVERTICULITIS, HX OF 11/01/2009    Past Surgical History:  Procedure Laterality Date  . COLON SURGERY    . COLOSTOMY  11/08/2012  . COLOSTOMY CLOSURE N/A 05/12/2013  Procedure: Exploratory Laparotomy, Lysis of adhesions, Colon resection, Proctoscope, COLOSTOMY CLOSURE;  Surgeon: Joyice Faster. Cornett, MD;  Location: [facility];  Service: General;  Laterality: N/A;  yellow fin stirrups   . COLOSTOMY REVERSAL  05/12/2013  . FEMORAL ARTERY STENT Right ?2002  . LAPAROTOMY N/A 11/08/2012   Procedure: EXPLORATORY LAPAROTOMY with descending and sigmoid colectomy; wound vac placement and colostomy;  Surgeon: Joyice Faster. Cornett, MD;  Location: [place];  Service: General;  Laterality: N/A;  . LUNG LOBECTOMY Left 2005   "upper lobe; bronchial tube replaced w/piece of  lining of his rib" (05/12/2013)       Family History  Problem Relation Age of Onset  . Heart attack Mother   . Tuberculosis Mother   . Heart attack Father   . Stroke Father   . Heart attack Brother   . Heart disease Sister   . Cancer Brother        Prostate    Social History   Tobacco Use  . Smoking status: Former Smoker    Packs/day: 1.00    Years: 30.00    Pack years: 30.00    Types: Cigarettes    Quit date: 10/02/2003    Years since quitting: 16.4  . Smokeless tobacco: Current User    Types: Chew  Substance Use Topics  . Alcohol use: No  . Drug use: No    Home Medications Prior to Admission medications   Medication Sig Start Date End Date Taking? Authorizing Provider  acetaminophen (TYLENOL) 500 MG tablet Take 2 tablets (1,000 mg total) by mouth every 8 (eight) hours. Patient taking differently: Take 1,000 mg by mouth every 6 (six) hours as needed for mild pain.  01/05/20 03/05/20 Yes Mercy Riding, MD  albuterol (PROVENTIL HFA;VENTOLIN HFA) 108 (90 BASE) MCG/ACT inhaler Inhale 2 puffs into the lungs every 6 (six) hours as needed for wheezing or shortness of breath.    Yes [provider]  albuterol (PROVENTIL) (2.5 MG/3ML) 0.083% nebulizer solution Take 2.5 mg by nebulization every 6 (six) hours as needed for wheezing or shortness of breath.   Yes [provider]  atorvastatin (LIPITOR) 20 MG tablet Take 2 tablets (40 mg total) by mouth daily at 6 PM. 01/05/20  Yes Gonfa, Charlesetta Ivory, MD  dabigatran (PRADAXA) 150 MG CAPS capsule Take 150 mg by mouth 2 (two) times daily.    Yes [provider]  diltiazem (DILACOR XR) 240 MG 24 hr capsule Take 240 mg by mouth daily.   Yes [provider]  finasteride (PROSCAR) 5 MG tablet Take 5 mg by mouth daily.   Yes [provider]  fluticasone (FLONASE) 50 MCG/ACT nasal spray Place 2 sprays into both nostrils 2 (two) times daily as needed for allergies.    Yes [provider]  gabapentin  (NEURONTIN) 300 MG capsule Take 300-600 mg by mouth See admin instructions. Take 300 mg by mouth in the morning and 600 mg in the evening   Yes [provider]  guaiFENesin (MUCINEX) 600 MG 12 hr tablet Take 600 mg by mouth in the morning, at noon, and at bedtime.    Yes [provider]  lisinopril (ZESTRIL) 20 MG tablet Take 20 mg by mouth daily. 01/06/20  Yes [provider]  losartan (COZAAR) 50 MG tablet Take 25 mg by mouth daily.   Yes [provider]  memantine (NAMENDA) 10 MG tablet Take 10 mg by mouth 2 (two) times daily.  08/22/19  Yes [provider]  omeprazole (PRILOSEC OTC) 20 MG tablet Take 20 mg by mouth 2 (two) times daily as needed (Acid reflux).    Yes [provider]  ondansetron (ZOFRAN ODT) 4 MG disintegrating tablet Take 1 tablet (4 mg total) by mouth every 6 (six) hours as needed for nausea or vomiting. 01/05/20  Yes Mercy Riding, MD  OXYGEN Inhale 2.5 L/min into the lungs continuous.   Yes [provider]  Tiotropium Bromide-Olodaterol (STIOLTO RESPIMAT) 2.5-2.5 MCG/ACT AERS Inhale 2 puffs into the lungs daily.   Yes [provider]  diltiazem (TIAZAC) 180 MG 24 hr capsule Take 1 capsule (180 mg total) by mouth daily. Patient not taking: Reported on 03/05/2020 01/05/20   Mercy Riding, MD    Allergies    Amlodipine besy-benazepril hcl, Meloxicam, and Penicillins  Review of Systems   Review of Systems  Unable to perform ROS: Dementia    Physical Exam Updated Vital Signs BP 113/60   Pulse 65   Temp (!) 97.5 F (36.4 C) (Oral)   Resp 16   SpO2 93%   Physical Exam Vitals and nursing note reviewed.  Constitutional:      General: He is not in acute distress.    Appearance: He is well-developed. He is not diaphoretic.  HENT:     Head: Normocephalic and atraumatic.  Eyes:     General: No scleral icterus.    Conjunctiva/sclera: Conjunctivae normal.  Cardiovascular:     Rate and Rhythm: Normal rate  and regular rhythm.     Heart sounds: Normal heart sounds.  Pulmonary:     Effort: Pulmonary effort is normal. No respiratory distress.     Breath sounds: Normal breath sounds.  Abdominal:     Palpations: Abdomen is soft.     Tenderness: There is no abdominal tenderness.  Musculoskeletal:     Cervical back: Normal range of motion and neck supple.  Skin:    General: Skin is warm and dry.  Neurological:     Mental Status: He is alert.  Psychiatric:        Behavior: Behavior normal.     ED Results / Procedures / Treatments   Labs (all labs ordered are listed, but only abnormal results are displayed) Labs Reviewed  COMPREHENSIVE METABOLIC PANEL - Abnormal; Notable for the following components:      Result Value   Chloride 96 (*)    Glucose, Bld 173 (*)    BUN 37 (*)    Creatinine, Ser 1.66 (*)    Calcium 8.2 (*)    Total Protein 6.1 (*)    Albumin 3.2 (*)    GFR calc non Af Amer 41 (*)    GFR calc Af Amer 48 (*)    Anion gap 16 (*)    All other components within normal limits  CBC WITH DIFFERENTIAL/PLATELET - Abnormal; Notable for the following components:   WBC 12.6 (*)    RBC 3.85 (*)    Hemoglobin 10.3 (*)    HCT 33.6 (*)    RDW 16.3 (*)    Neutro Abs 10.7 (*)    All other components within normal limits  CBG MONITORING, ED - Abnormal; Notable for the following components:   Glucose-Capillary 156 (*)    All other components within normal limits  SARS CORONAVIRUS 2 BY RT PCR (HOSPITAL ORDER, [facility])  CULTURE, BLOOD (ROUTINE X 2)  CULTURE, BLOOD (ROUTINE X 2)  URINALYSIS, ROUTINE W REFLEX MICROSCOPIC  LACTIC ACID, PLASMA    EKG EKG Interpretation  Date/Time:  Saturday March 05 2020 37:85:88 EDT Ventricular Rate:  59 PR Interval:    QRS Duration: 97 QT Interval:  481 QTC Calculation: 477 R Axis:   80 Text Interpretation: Atrial fibrillation Probable anterior infarct, old Since last tracing Rate slower Confirmed by Calvert Cantor [phone]) on 03/05/2020 9:58:18 PM   Radiology No results found.  Procedures .Critical Care Performed by: Margarita Mail, PA-C Authorized by: Margarita Mail, PA-C   Critical care provider statement:    Critical care time (minutes):  40   Critical care time was exclusive of:  Separately billable procedures and treating other patients   Critical care was necessary to treat or prevent imminent or life-threatening deterioration of the following conditions:  Dehydration   Critical care was time spent personally by me on the following activities:  Discussions with consultants, evaluation of patient's response to treatment, examination of patient, ordering and performing treatments and interventions, ordering and review of laboratory studies, ordering and review of radiographic studies, pulse oximetry, re-evaluation of patient's condition, obtaining history from patient or surrogate and review of old charts   (including critical care time)  Medications Ordered in ED Medications  sodium chloride 0.9 % bolus 1,000 mL (0 mLs Intravenous Stopped 03/05/20 2114)  sodium chloride 0.9 % bolus 1,000 mL (0 mLs Intravenous Stopped 03/05/20 2257)    ED Course  I have reviewed the triage vital signs and the nursing notes.  Pertinent labs & imaging results that were available during my care of the patient were reviewed by me and considered in my medical decision making (see chart for details).    MDM Rules/Calculators/A&P                      AJ:OINOMVEH VS: Initial systolic pressure of 87 MC:NOBSJGG is gathered by patient's daughter and EMR. Previous records obtained and reviewed. DDX:The patient's complaint of weakness involves an extensive number of diagnostic and treatment options, and is a complaint that carries with it a high risk of complications, morbidity, and potential mortality. Given the large differential diagnosis, medical decision making is of high complexity. The differential  diagnosis of weakness includes but is not limited to neurologic causes (GBS, myasthenia gravis, CVA, MS, ALS, transverse myelitis, spinal cord injury, CVA, botulism, ) and other causes: ACS, Arrhythmia, syncope, orthostatic hypotension, sepsis, hypoglycemia, electrolyte disturbance, hypothyroidism, respiratory failure, symptomatic anemia, dehydration, heat injury, polypharmacy, malignancy.  Labs: I ordered reviewed and interpreted labs which include CBC shows leukocytosis, hemoglobin of 10.6 down from 12.6 within the month. CMP shows mild hyperglycemia, AKI with creatinine of 1.66 doubled from patient's baseline.  Urine, lactate, and blood cultures pending Imaging: N/A EKG: Rate controlled atrial fibrillation at a rate of 59 Consults: Dr. Josephine Cables MDM: Patient here with weakness, acute kidney injury, hypotension.  I have low suspicion for sepsis.  I have ordered blood cultures.  He has required multiple rounds of fluid resuscitation and his vital signs have improved significantly.  The patient will be admitted to the hospitalist service.  Question if his hypotension is also multifactorial given the numerous antihypertensive medications and recent addition within the last few weeks compounded with recent volume loss from diarrhea. Patient disposition: Admit The patient appears reasonably stabilized for admission considering the current resources, flow, and capabilities available in the ED at this time, and I doubt any other [facility] requiring further screening and/or treatment in the ED prior to admission.  Final Clinical Impression(s) / ED Diagnoses Final diagnoses:  AKI (acute kidney injury) ([place])  Hypotension, unspecified hypotension type    Rx / DC Orders ED Discharge Orders    None       Margarita Mail, PA-C 03/06/20 1340    Margarita Mail, PA-C 03/06/20 1340    Truddie Hidden, MD 03/06/20 [phone]

## 2020-03-05 NOTE — ED Triage Notes (Signed)
Pt BIB [facility] from home c/o weakness. Pt was walking to the kitchen when his legs gave out. Pt's family member helped lower pt to the ground. Pt was pale and diaphoretic. EMS stated pt had weak radial pulses and palpated a BP of 90. Pt was given 1250 ml of NS and 125 of solu-medrol for wheezing. Pt has a hx of COPD, a-fib and dementia. Pt is alert and oriented to self at baseline.

## 2020-03-06 ENCOUNTER — Encounter (HOSPITAL_COMMUNITY): Payer: Self-pay | Admitting: Internal Medicine

## 2020-03-06 DIAGNOSIS — I950 Idiopathic hypotension: Secondary | ICD-10-CM

## 2020-03-06 DIAGNOSIS — I10 Essential (primary) hypertension: Secondary | ICD-10-CM

## 2020-03-06 DIAGNOSIS — N179 Acute kidney failure, unspecified: Secondary | ICD-10-CM

## 2020-03-06 DIAGNOSIS — K219 Gastro-esophageal reflux disease without esophagitis: Secondary | ICD-10-CM

## 2020-03-06 DIAGNOSIS — J449 Chronic obstructive pulmonary disease, unspecified: Secondary | ICD-10-CM

## 2020-03-06 DIAGNOSIS — E785 Hyperlipidemia, unspecified: Secondary | ICD-10-CM | POA: Diagnosis not present

## 2020-03-06 DIAGNOSIS — N400 Enlarged prostate without lower urinary tract symptoms: Secondary | ICD-10-CM

## 2020-03-06 DIAGNOSIS — I480 Paroxysmal atrial fibrillation: Secondary | ICD-10-CM

## 2020-03-06 DIAGNOSIS — L8990 Pressure ulcer of unspecified site, unspecified stage: Secondary | ICD-10-CM | POA: Insufficient documentation

## 2020-03-06 DIAGNOSIS — I959 Hypotension, unspecified: Secondary | ICD-10-CM | POA: Diagnosis present

## 2020-03-06 LAB — URINALYSIS, ROUTINE W REFLEX MICROSCOPIC
Bilirubin Urine: NEGATIVE
Glucose, UA: 50 mg/dL — AB
Hgb urine dipstick: NEGATIVE
Ketones, ur: NEGATIVE mg/dL
Leukocytes,Ua: NEGATIVE
Nitrite: NEGATIVE
Protein, ur: 30 mg/dL — AB
Specific Gravity, Urine: 1.012 (ref 1.005–1.030)
pH: 6 (ref 5.0–8.0)

## 2020-03-06 LAB — COMPREHENSIVE METABOLIC PANEL
ALT: 16 U/L (ref 0–44)
AST: 18 U/L (ref 15–41)
Albumin: 3.3 g/dL — ABNORMAL LOW (ref 3.5–5.0)
Alkaline Phosphatase: 92 U/L (ref 38–126)
Anion gap: 13 (ref 5–15)
BUN: 32 mg/dL — ABNORMAL HIGH (ref 8–23)
CO2: 24 mmol/L (ref 22–32)
Calcium: 8.5 mg/dL — ABNORMAL LOW (ref 8.9–10.3)
Chloride: 97 mmol/L — ABNORMAL LOW (ref 98–111)
Creatinine, Ser: 1.31 mg/dL — ABNORMAL HIGH (ref 0.61–1.24)
GFR calc Af Amer: >60 mL/min (ref >60)
GFR calc non Af Amer: 55 mL/min — ABNORMAL LOW (ref >60)
Glucose, Bld: 179 mg/dL — ABNORMAL HIGH (ref 70–99)
Potassium: 4 mmol/L (ref 3.5–5.1)
Sodium: 134 mmol/L — ABNORMAL LOW (ref 135–145)
Total Bilirubin: 0.4 mg/dL (ref 0.3–1.2)
Total Protein: 6.7 g/dL (ref 6.5–8.1)

## 2020-03-06 LAB — PROTIME-INR
INR: 1.3 — ABNORMAL HIGH (ref 0.8–1.2)
Prothrombin Time: 15.8 seconds — ABNORMAL HIGH (ref 11.4–15.2)

## 2020-03-06 LAB — CBC
HCT: 34.3 % — ABNORMAL LOW (ref 39.0–52.0)
Hemoglobin: 10.6 g/dL — ABNORMAL LOW (ref 13.0–17.0)
MCH: 26.5 pg (ref 26.0–34.0)
MCHC: 30.9 g/dL (ref 30.0–36.0)
MCV: 85.8 fL (ref 80.0–100.0)
Platelets: 252 10*3/uL (ref 150–400)
RBC: 4 MIL/uL — ABNORMAL LOW (ref 4.22–5.81)
RDW: 16.2 % — ABNORMAL HIGH (ref 11.5–15.5)
WBC: 3.6 10*3/uL — ABNORMAL LOW (ref 4.0–10.5)
nRBC: 0 % (ref 0.0–0.2)

## 2020-03-06 LAB — MAGNESIUM: Magnesium: 1.7 mg/dL (ref 1.7–2.4)

## 2020-03-06 LAB — PHOSPHORUS: Phosphorus: 2.8 mg/dL (ref 2.5–4.6)

## 2020-03-06 LAB — HEMOGLOBIN A1C
Hgb A1c MFr Bld: 6.3 % — ABNORMAL HIGH (ref 4.8–5.6)
Mean Plasma Glucose: 134.11 mg/dL

## 2020-03-06 LAB — LACTIC ACID, PLASMA: Lactic Acid, Venous: 1.8 mmol/L (ref 0.5–1.9)

## 2020-03-06 LAB — APTT: aPTT: 57 seconds — ABNORMAL HIGH (ref 24–36)

## 2020-03-06 MED ORDER — DABIGATRAN ETEXILATE MESYLATE 150 MG PO CAPS
150.0000 mg | ORAL_CAPSULE | Freq: Two times a day (BID) | ORAL | Status: DC
  Administered 2020-03-06: 150 mg via ORAL
  Filled 2020-03-06 (×2): qty 1

## 2020-03-06 MED ORDER — MEMANTINE HCL 10 MG PO TABS
10.0000 mg | ORAL_TABLET | Freq: Two times a day (BID) | ORAL | Status: DC
  Administered 2020-03-06: 10 mg via ORAL
  Filled 2020-03-06 (×2): qty 1

## 2020-03-06 MED ORDER — SODIUM CHLORIDE 0.9 % IV SOLN: Freq: Once | INTRAVENOUS | Status: AC

## 2020-03-06 MED ORDER — HEPARIN SODIUM (PORCINE) 5000 UNIT/ML IJ SOLN: 5000.0000 [IU] | Freq: Three times a day (TID) | INTRAMUSCULAR | Status: DC

## 2020-03-06 MED ORDER — ALBUTEROL SULFATE (2.5 MG/3ML) 0.083% IN NEBU: 2.5000 mg | INHALATION_SOLUTION | Freq: Four times a day (QID) | RESPIRATORY_TRACT | Status: DC | PRN

## 2020-03-06 MED ORDER — ATORVASTATIN CALCIUM 40 MG PO TABS: 40.0000 mg | ORAL_TABLET | Freq: Every day | ORAL | Status: DC

## 2020-03-06 MED ORDER — ALBUTEROL SULFATE HFA 108 (90 BASE) MCG/ACT IN AERS: 2.0000 | INHALATION_SPRAY | Freq: Four times a day (QID) | RESPIRATORY_TRACT | Status: DC | PRN

## 2020-03-06 MED ORDER — ACETAMINOPHEN 325 MG PO TABS: 650.0000 mg | ORAL_TABLET | Freq: Four times a day (QID) | ORAL | Status: DC | PRN

## 2020-03-06 MED ORDER — FINASTERIDE 5 MG PO TABS
5.0000 mg | ORAL_TABLET | Freq: Every day | ORAL | Status: DC
  Administered 2020-03-06: 5 mg via ORAL
  Filled 2020-03-06: qty 1

## 2020-03-06 MED ORDER — ACETAMINOPHEN 650 MG RE SUPP: 650.0000 mg | Freq: Four times a day (QID) | RECTAL | Status: DC | PRN

## 2020-03-06 MED ORDER — OMEPRAZOLE 20 MG PO CPDR
20.0000 mg | DELAYED_RELEASE_CAPSULE | Freq: Two times a day (BID) | ORAL | Status: DC | PRN
  Filled 2020-03-06: qty 1

## 2020-03-06 NOTE — H&P (Signed)
History and Physical  KAREN KINNARD JKD:[id number] DOB: Dec 30, 1950 DOA: 03/05/2020  Referring physician: Margarita Mail PA-C PCP: Ann Maki, MD  Patient coming from: Home   Chief Complaint: Generalized weakness  HPI: James [name] is a 69 y.o. male with medical history significant for COPD on 2-3 LPM, PAF on Pradaxa, lung cancer status post lobectomy, AAA, dementia, hyperlipidemia, hypertension, GERD who presents to the emergency department due to near syncopal events at home.  History cannot be obtained from patient, history was obtained from ED physician and ED medical record.  Per report, patient has been having low blood pressure at home, he was on multiple hypertension medications and losartan was recently added to the medication list.  While at home eating today, he had a sudden onset of lightheadedness and near syncope, so daughter brought him to the emergency department for evaluation and management.  Of note, patient was reported to have about 5 days of diarrhea about 2 weeks ago during which he also had excessive urination.  Patient denies chest pain, shortness of breath, fever, chills, nausea, vomiting or abdominal pain.   ED Course:  In the emergency department, he was hypotensive with a BP of 80/54.  Orthostatic BP was negative.  Work-up in the ED showed leukocytosis and normocytic anemia.  Patient also presents with hyperglycemia, BUN/creatinine 37/1.66 (baseline creatinine 0.82-9.4). SARS coronavirus 2 was negative.  IV hydration was provided and hospitalist was asked to admit for further evaluation and management.  Review of Systems: Constitutional: Negative for chills and fever.  HENT: Negative for ear pain and sore throat.   Eyes: Negative for pain and visual disturbance.  Respiratory: Negative for cough, chest tightness and shortness of breath.   Cardiovascular: Negative for chest pain and palpitations.  Gastrointestinal: Negative for abdominal pain and vomiting.   Genitourinary: Negative for decreased urine volume, dysuria Musculoskeletal: Negative for arthralgias and back pain.  Skin: Negative for color change and rash.  Allergic/Immunologic: Negative for immunocompromised state.  Neurological: Negative for tremors, syncope, speech difficulty, weakness, light-headedness and headaches.  Hematological: Does not bruise/bleed easily.    Past Medical History:  Diagnosis Date  . Aorta aneurysm (HCC)    4.2 ascending thoracic aortic aneursym without evidence of abdominal aortic aneurysm on CTA 11/07/12  . Aortic aneurysm ([place])    "unrepaired; has it checked q yr" (05/12/2013)  . BPH (benign prostatic hypertrophy)   . Cancer of lung [facility])    s/p resection in 2005 at [facility] / [place] patient  . Chronic back pain started in 1990's  . Chronic lower back pain   . Chronic respiratory failure with hypoxia, on home O2 therapy (HCC)    3lpm with exertion  . Colitis   . COPD (chronic obstructive pulmonary disease) ([place])   . Exertional shortness of breath   . Exposure to Agent Orange    [place] War  . GERD (gastroesophageal reflux disease)   . High cholesterol   . Hypertension   . Liver cancer ([place]) 2017   treated at [facility]  . LUNG CANCER, HX OF 11/01/2009   Qualifier: Diagnosis of  By: Ronnald Ramp MD, Arvid Right.   . OSA on CPAP   . PAD (peripheral artery disease) ([place])    Past Surgical History:  Procedure Laterality Date  . COLON SURGERY    . COLOSTOMY  11/08/2012  . COLOSTOMY CLOSURE N/A 05/12/2013   Procedure: Exploratory Laparotomy, Lysis of adhesions, Colon resection, Proctoscope, COLOSTOMY CLOSURE;  Surgeon: Joyice Faster. Cornett, MD;  Location: [facility] OR;  Service: General;  Laterality: N/A;  yellow fin stirrups   . COLOSTOMY REVERSAL  05/12/2013  . FEMORAL ARTERY STENT Right ?2002  . LAPAROTOMY N/A 11/08/2012   Procedure: EXPLORATORY LAPAROTOMY with descending and sigmoid colectomy; wound vac placement and colostomy;  Surgeon: Joyice Faster. Cornett, MD;  Location: [facility] OR;   Service: General;  Laterality: N/A;  . LUNG LOBECTOMY Left 2005   "upper lobe; bronchial tube replaced w/piece of lining of his rib" (05/12/2013)    Social History:  reports that he quit smoking about 16 years ago. His smoking use included cigarettes. He has a 30.00 pack-year smoking history. His smokeless tobacco use includes chew. He reports that he does not drink alcohol or use drugs.   Allergies  Allergen Reactions  . Amlodipine Besy-Benazepril Hcl Shortness Of Breath  . Meloxicam Nausea Only and Other (See Comments)    Stomach upset  . Penicillins Rash    Has patient had a PCN reaction causing immediate rash, facial/tongue/throat swelling, SOB or lightheadedness with hypotension: No Has patient had a PCN reaction causing severe rash involving mucus membranes or skin necrosis: No Has patient had a PCN reaction that required hospitalization: No Has patient had a PCN reaction occurring within the last 10 years: No If all of the above answers are "NO", then may proceed with Cephalosporin use.    Family History  Problem Relation Age of Onset  . Heart attack Mother   . Tuberculosis Mother   . Heart attack Father   . Stroke Father   . Heart attack Brother   . Heart disease Sister   . Cancer Brother        Prostate      Prior to Admission medications   Medication Sig Start Date End Date Taking? Authorizing Provider  albuterol (PROVENTIL HFA;VENTOLIN HFA) 108 (90 BASE) MCG/ACT inhaler Inhale 2 puffs into the lungs every 6 (six) hours as needed for wheezing or shortness of breath.    Yes [provider]  albuterol (PROVENTIL) (2.5 MG/3ML) 0.083% nebulizer solution Take 2.5 mg by nebulization every 6 (six) hours as needed for wheezing or shortness of breath.   Yes [provider]  atorvastatin (LIPITOR) 20 MG tablet Take 2 tablets (40 mg total) by mouth daily at 6 PM. 01/05/20  Yes Gonfa, Charlesetta Ivory, MD  dabigatran (PRADAXA) 150 MG CAPS capsule Take 150 mg by mouth 2  (two) times daily.    Yes [provider]  diltiazem (DILACOR XR) 240 MG 24 hr capsule Take 240 mg by mouth daily.   Yes [provider]  finasteride (PROSCAR) 5 MG tablet Take 5 mg by mouth daily.   Yes [provider]  fluticasone (FLONASE) 50 MCG/ACT nasal spray Place 2 sprays into both nostrils 2 (two) times daily as needed for allergies.    Yes [provider]  gabapentin (NEURONTIN) 300 MG capsule Take 300-600 mg by mouth See admin instructions. Take 300 mg by mouth in the morning and 600 mg in the evening   Yes [provider]  guaiFENesin (MUCINEX) 600 MG 12 hr tablet Take 600 mg by mouth in the morning, at noon, and at bedtime.    Yes [provider]  lisinopril (ZESTRIL) 20 MG tablet Take 20 mg by mouth daily. 01/06/20  Yes [provider]  losartan (COZAAR) 50 MG tablet Take 25 mg by mouth daily.   Yes [provider]  memantine (NAMENDA) 10 MG tablet Take 10 mg by  mouth 2 (two) times daily.  08/22/19  Yes [provider]  omeprazole (PRILOSEC OTC) 20 MG tablet Take 20 mg by mouth 2 (two) times daily as needed (Acid reflux).    Yes [provider]  ondansetron (ZOFRAN ODT) 4 MG disintegrating tablet Take 1 tablet (4 mg total) by mouth every 6 (six) hours as needed for nausea or vomiting. 01/05/20  Yes Mercy Riding, MD  OXYGEN Inhale 2.5 L/min into the lungs continuous.   Yes [provider]  Tiotropium Bromide-Olodaterol (STIOLTO RESPIMAT) 2.5-2.5 MCG/ACT AERS Inhale 2 puffs into the lungs daily.   Yes [provider]  diltiazem (TIAZAC) 180 MG 24 hr capsule Take 1 capsule (180 mg total) by mouth daily. Patient not taking: Reported on 03/05/2020 01/05/20   Mercy Riding, MD    Physical Exam: BP 118/62 (BP Location: Left Arm)   Pulse 87   Temp 98 F (36.7 C) (Oral)   Resp 16   SpO2 93%   . General: 69 y.o. year-old male well developed well nourished in no acute distress.  Alert and  oriented x3. . Cardiovascular: Regular rate and rhythm with no rubs or gallops.  No thyromegaly or JVD noted.  No lower extremity edema. 2/4 pulses in all 4 extremities. Marland Kitchen Respiratory: Clear to auscultation with no wheezes or rales. Good inspiratory effort. . Abdomen: Soft nontender nondistended with normal bowel sounds x4 quadrants. . Muskuloskeletal: No cyanosis, clubbing or edema noted bilaterally . Neuro: CN II-XII intact, strength, sensation, reflexes . Skin: No ulcerative lesions noted or rashes . Psychiatry: Judgement and insight appear normal. Mood is appropriate for condition and setting          Labs on Admission:  Basic Metabolic Panel: Recent Labs  Lab 03/05/20 2012  NA 135  K 4.6  CL 96*  CO2 23  GLUCOSE 173*  BUN 37*  CREATININE 1.66*  CALCIUM 8.2*   Liver Function Tests: Recent Labs  Lab 03/05/20 2012  AST 18  ALT 14  ALKPHOS 93  BILITOT 0.5  PROT 6.1*  ALBUMIN 3.2*   No results for input(s): LIPASE, AMYLASE in the last 168 hours. No results for input(s): AMMONIA in the last 168 hours. CBC: Recent Labs  Lab 03/05/20 2012  WBC 12.6*  NEUTROABS 10.7*  HGB 10.3*  HCT 33.6*  MCV 87.3  PLT 247   Cardiac Enzymes: No results for input(s): CKTOTAL, CKMB, CKMBINDEX, TROPONINI in the last 168 hours.  BNP (last 3 results) No results for input(s): BNP in the last 8760 hours.  ProBNP (last 3 results) No results for input(s): PROBNP in the last 8760 hours.  CBG: Recent Labs  Lab 03/05/20 2129  GLUCAP 156*    Radiological Exams on Admission: No results found.  EKG: I independently viewed the EKG done and my findings are as followed: A. fib with rate control  Assessment/Plan Present on Admission: . Hypotension . GERD . Essential hypertension . Paroxysmal atrial fibrillation (HCC)  Principal Problem:   Hypotension Active Problems:   Dyslipidemia   Essential hypertension   GERD   BPH (benign prostatic hyperplasia)   Paroxysmal atrial  fibrillation (HCC)   COPD (chronic obstructive pulmonary disease) (HCC)   Pressure injury of skin  Near syncope in the setting of hypotension Continue telemetry Patient was on several BP meds, BP meds will be held at this time Continue IV hydration  Dehydration/Acute kidney injury BUN/creatinine 37/1.66 (baseline creatinine 0.82-9.4) Renally adjust medications, avoid nephrotoxic agents/dehydration/hypotension  Leukocytosis possibly reactive  WBC 12.6, no acute suspicion for infectious process at this time Continue to monitor WBC with morning labs  Hyperglycemia with no known history of type 2 diabetes mellitus Hemoglobin A1c will be checked  PAF on Pradaxa Continue Pradaxa BP meds held at this time due to soft BP  Dementia without behavioral disturbance  Stable, Frequent reorientation and fall and delirium precautions. Continue home medications  Hypertension BP meds held at this time due to hypotension  GERD  Continue PPI  BPH Continue home meds  COPD Continue home meds when med rec is updated   DVT prophylaxis: Pradaxa  Code Status: Full code  Family Communication: Wife on the phone (all questions answered to satisfaction)  Disposition Plan:  Patient is from:                        home Anticipated DC to:                   SNF or family members home Anticipated DC date:               24 hours Anticipated DC barriers:         Improvement in blood pressure   Consults called: None  Admission status: Observation    Bernadette Hoit MD [facility]  If 7PM-7AM, please contact night-coverage [email or web address]  03/06/2020, 4:24 AM

## 2020-03-06 NOTE — Progress Notes (Addendum)
Went into room to complete discharge teaching and patient and daughter were not in room and have not returned back to the floor. I had seen them walking in the hall but the daughter told me they were just going for a walk but they have still not returned to the room. Discharge teaching was not done.

## 2020-03-06 NOTE — Discharge Instructions (Signed)
Information on my medicine - Pradaxa (dabigatran)  This medication education was reviewed with me or my healthcare representative as part of my discharge preparation.  The pharmacist that spoke with me during my hospital stay was:  Onnie Boer, RPH-CPP  Why was Pradaxa prescribed for you? Pradaxa was prescribed for you to reduce the risk of forming blood clots that cause a stroke if you have a medical condition called atrial fibrillation (a type of irregular heartbeat).    What do you Need to know about PradAXa? Take your Pradaxa TWICE DAILY - one capsule in the morning and one tablet in the evening with or without food.  It would be best to take the doses about the same time each day.  The capsules should not be broken, chewed or opened - they must be swallowed whole.  Do not store Pradaxa in other medication containers - once the bottle is opened the Pradaxa should be used within FOUR months; throw away any capsules that haven't been by that time.  Take Pradaxa exactly as prescribed by your doctor.  DO NOT stop taking Pradaxa without talking to the doctor who prescribed the medication.  Stopping without other stroke prevention medication to take the place of Pradaxa may increase your risk of developing a clot that causes a stroke.  Refill your prescription before you run out.  After discharge, you should have regular check-up appointments with your healthcare provider that is prescribing your Pradaxa.  In the future your dose may need to be changed if your kidney function or weight changes by a significant amount.  What do you do if you miss a dose? If you miss a dose, take it as soon as you remember on the same day.  If your next dose is less than 6 hours away, skip the missed dose.  Do not take two doses of PRADAXA at the same time.  Important Safety Information A possible side effect of Pradaxa is bleeding. You should call your healthcare provider right away if you experience any of  the following: ? Bleeding from an injury or your nose that does not stop. ? Unusual colored urine (red or dark brown) or unusual colored stools (red or black). ? Unusual bruising for unknown reasons. ? A serious fall or if you hit your head (even if there is no bleeding).  Some medicines may interact with Pradaxa and might increase your risk of bleeding or clotting while on Pradaxa. To help avoid this, consult your healthcare provider or pharmacist prior to using any new prescription or non-prescription medications, including herbals, vitamins, non-steroidal anti-inflammatory drugs (NSAIDs) and supplements.  This website has more information on Pradaxa (dabigatran): [email or web address]://www.pradaxa.com

## 2020-03-06 NOTE — Progress Notes (Signed)
Attempted to call daughter to go over discharge instructions but she did not answer and has still not returned back to the room.

## 2020-03-06 NOTE — Discharge Summary (Signed)
Physician Discharge Summary  James [name] PJA:[id number] DOB: October 14, 1950 DOA: 03/05/2020  PCP: Ann Maki, MD  Admit date: 03/05/2020 Discharge date: 03/06/2020  Admitted From: Home Disposition:  Home  Recommendations for Outpatient Follow-up:  1. Follow up with PCP in 1-2 weeks 2. Recommend repeat BMET in 1-2 weeks 3. Please monitor blood pressure closely as outpatient. ACE/ARB on home med rec held on d/c given presenting soft BP and renal failure. Would resume as tolerted  Discharge Condition:Improved CODE STATUS:Full Diet recommendation: Regular   Brief/Interim Summary: 69 y.o. male with medical history significant for COPD on 2-3 LPM, PAF on Pradaxa, lung cancer status post lobectomy, AAA, dementia, hyperlipidemia, hypertension, GERD who presents to the emergency department due to near syncopal events at home.  History cannot be obtained from patient, history was obtained from ED physician and ED medical record.  Per report, patient has been having low blood pressure at home, he was on multiple hypertension medications and losartan was recently added to the medication list.  While at home eating today, he had a sudden onset of lightheadedness and near syncope, so daughter brought him to the emergency department for evaluation and management.  Of note, patient was reported to have about 5 days of diarrhea about 2 weeks ago during which he also had excessive urination.  Patient denies chest pain, shortness of breath, fever, chills, nausea, vomiting or abdominal pain.   ED Course:  In the emergency department, he was hypotensive with a BP of 80/54.  Orthostatic BP was negative.  Work-up in the ED showed leukocytosis and normocytic anemia.  Patient also presents with hyperglycemia, BUN/creatinine 37/1.66 (baseline creatinine 0.82-9.4). SARS coronavirus 2 was negative.  IV hydration was provided and hospitalist was asked to admit for further evaluation and management.  Discharge  Diagnoses:  Principal Problem:   Hypotension Active Problems:   Dyslipidemia   Essential hypertension   GERD   BPH (benign prostatic hyperplasia)   Paroxysmal atrial fibrillation (HCC)   COPD (chronic obstructive pulmonary disease) (HCC)   Pressure injury of skin  Principal Problem:   Hypotension Active Problems:   Dyslipidemia   Essential hypertension   GERD   BPH (benign prostatic hyperplasia)   Paroxysmal atrial fibrillation (HCC)   COPD (chronic obstructive pulmonary disease) (HCC)   Pressure injury of skin  Near syncope in the setting of hypotension Patient was on several BP meds which were held initially at time of presentation Clinically improved with IVF hydration  Dehydration/Acute kidney injury BUN/creatinine 37/1.66 (baseline creatinine 0.82-9.4) Renal function improved with IVF hydration overnight  Leukocytosis possibly reactive WBC 12.6, no acute suspicion for infectious process at this time -WBC normalized  Hyperglycemia with no known history of type 2 diabetes mellitus Hemoglobin A1c 6.3  PAF on Pradaxa Continue Pradaxa as tolerated BP meds held at this time due to soft BP BP improved with hydration Have resumed home cardizem on d/c, would have pt's bp be followed up as outpt with recommendation to titrate bp meds as outpatient  Dementia without behavioral disturbance  Stable, Frequent reorientation and fall and delirium precautions. Continuehome medications -Able to ambulate in hallway independently this afternoon without difficulty  Hypertension -BP meds held at this time due to hypotension  GERD  -Continue PPI  BPH -Continue home meds  COPD -Cont home meds on d/c   Discharge Instructions   Allergies as of 03/06/2020      Reactions   Amlodipine Besy-benazepril Hcl Shortness Of Breath   Meloxicam Nausea Only, Other (  See Comments)   Stomach upset   Penicillins Rash   Has patient had a PCN reaction causing immediate rash,  facial/tongue/throat swelling, SOB or lightheadedness with hypotension: No Has patient had a PCN reaction causing severe rash involving mucus membranes or skin necrosis: No Has patient had a PCN reaction that required hospitalization: No Has patient had a PCN reaction occurring within the last 10 years: No If all of the above answers are "NO", then may proceed with Cephalosporin use.      Medication List    STOP taking these medications   acetaminophen 500 MG tablet Commonly known as: TYLENOL   diltiazem 180 MG 24 hr capsule Commonly known as: TIAZAC   lisinopril 20 MG tablet Commonly known as: ZESTRIL   losartan 50 MG tablet Commonly known as: COZAAR     TAKE these medications   albuterol (2.5 MG/3ML) 0.083% nebulizer solution Commonly known as: PROVENTIL Take 2.5 mg by nebulization every 6 (six) hours as needed for wheezing or shortness of breath.   albuterol 108 (90 Base) MCG/ACT inhaler Commonly known as: VENTOLIN HFA Inhale 2 puffs into the lungs every 6 (six) hours as needed for wheezing or shortness of breath.   atorvastatin 20 MG tablet Commonly known as: LIPITOR Take 2 tablets (40 mg total) by mouth daily at 6 PM.   dabigatran 150 MG Caps capsule Commonly known as: PRADAXA Take 150 mg by mouth 2 (two) times daily.   diltiazem 240 MG 24 hr capsule Commonly known as: DILACOR XR Take 240 mg by mouth daily.   finasteride 5 MG tablet Commonly known as: PROSCAR Take 5 mg by mouth daily.   fluticasone 50 MCG/ACT nasal spray Commonly known as: FLONASE Place 2 sprays into both nostrils 2 (two) times daily as needed for allergies.   gabapentin 300 MG capsule Commonly known as: NEURONTIN Take 300-600 mg by mouth See admin instructions. Take 300 mg by mouth in the morning and 600 mg in the evening   guaiFENesin 600 MG 12 hr tablet Commonly known as: MUCINEX Take 600 mg by mouth in the morning, at noon, and at bedtime.   memantine 10 MG tablet Commonly known  as: NAMENDA Take 10 mg by mouth 2 (two) times daily.   omeprazole 20 MG tablet Commonly known as: PRILOSEC OTC Take 20 mg by mouth 2 (two) times daily as needed (Acid reflux).   ondansetron 4 MG disintegrating tablet Commonly known as: Zofran ODT Take 1 tablet (4 mg total) by mouth every 6 (six) hours as needed for nausea or vomiting.   OXYGEN Inhale 2.5 L/min into the lungs continuous.   Stiolto Respimat 2.5-2.5 MCG/ACT Aers Generic drug: Tiotropium Bromide-Olodaterol Inhale 2 puffs into the lungs daily.      Follow-up Information    Ann Maki, MD. Schedule an appointment as soon as possible for a visit in 2 week(s).   Specialty: Internal Medicine Contact information: [place] [address] [phone]          Allergies  Allergen Reactions  . Amlodipine Besy-Benazepril Hcl Shortness Of Breath  . Meloxicam Nausea Only and Other (See Comments)    Stomach upset  . Penicillins Rash    Has patient had a PCN reaction causing immediate rash, facial/tongue/throat swelling, SOB or lightheadedness with hypotension: No Has patient had a PCN reaction causing severe rash involving mucus membranes or skin necrosis: No Has patient had a PCN reaction that required hospitalization: No Has patient had a PCN  reaction occurring within the last 10 years: No If all of the above answers are "NO", then may proceed with Cephalosporin use.    Procedures/Studies:  No results found.  Subjective: Very eager to go home   Discharge Exam: Vitals:   03/06/20 0630 03/06/20 1037  BP: (!) 145/84 (!) 163/92  Pulse: 81 82  Resp: 18 16  Temp: 98.5 F (36.9 C) 98.3 F (36.8 C)  SpO2: 96% 99%   Vitals:   03/06/20 0130 03/06/20 0212 03/06/20 0630 03/06/20 1037  BP: 122/64 118/62 (!) 145/84 (!) 163/92  Pulse: 71 87 81 82  Resp: 15 16 18 16   Temp:  98 F (36.7 C) 98.5 F (36.9 C) 98.3 F (36.8 C)  TempSrc:  Oral Oral   SpO2: 91% 93% 96% 99%  Weight:   87.1 kg      General: Pt is alert, awake, not in acute distress Cardiovascular: RRR, S1/S2 +, no rubs, no gallops Respiratory: CTA bilaterally, no wheezing, no rhonchi Abdominal: Soft, NT, ND, bowel sounds + Extremities: no edema, no cyanosis   The results of significant diagnostics from this hospitalization (including imaging, microbiology, ancillary and laboratory) are listed below for reference.     Microbiology: Recent Results (from the past 240 hour(s))  SARS Coronavirus 2 by RT PCR (hospital order, performed in [facility] hospital lab) Nasopharyngeal Nasopharyngeal Swab     Status: None   Collection Time: 03/05/20 10:01 PM   Specimen: Nasopharyngeal Swab  Result Value Ref Range Status   SARS Coronavirus 2 NEGATIVE NEGATIVE Final    Comment: (NOTE) SARS-CoV-2 target nucleic acids are NOT DETECTED. The SARS-CoV-2 RNA is generally detectable in upper and lower respiratory specimens during the acute phase of infection. The lowest concentration of SARS-CoV-2 viral copies this assay can detect is 250 copies / mL. A negative result does not preclude SARS-CoV-2 infection and should not be used as the sole basis for treatment or other patient management decisions.  A negative result may occur with improper specimen collection / handling, submission of specimen other than nasopharyngeal swab, presence of viral mutation(s) within the areas targeted by this assay, and inadequate number of viral copies (<250 copies / mL). A negative result must be combined with clinical observations, patient history, and epidemiological information. Fact Sheet for Patients:   StrictlyIdeas.no Fact Sheet for Healthcare Providers: BankingDealers.co.za This test is not yet approved or cleared  by the [place] FDA and has been authorized for detection and/or diagnosis of SARS-CoV-2 by FDA under an Emergency Use Authorization (EUA).  This EUA will  remain in effect (meaning this test can be used) for the duration of the COVID-19 declaration under Section 564(b)(1) of the Act, 21 U.S.C. section 360bbb-3(b)(1), unless the authorization is terminated or revoked sooner. Performed at [facility], [place] [address], [place], [place] [address]      Labs: BNP (last 3 results) No results for input(s): BNP in the last 8760 hours. Basic Metabolic Panel: Recent Labs  Lab 03/05/20 2012 03/06/20 0435  NA 135 134*  K 4.6 4.0  CL 96* 97*  CO2 23 24  GLUCOSE 173* 179*  BUN 37* 32*  CREATININE 1.66* 1.31*  CALCIUM 8.2* 8.5*  MG  --  1.7  PHOS  --  2.8   Liver Function Tests: Recent Labs  Lab 03/05/20 2012 03/06/20 0435  AST 18 18  ALT 14 16  ALKPHOS 93 92  BILITOT 0.5 0.4  PROT 6.1* 6.7  ALBUMIN 3.2* 3.3*  No results for input(s): LIPASE, AMYLASE in the last 168 hours. No results for input(s): AMMONIA in the last 168 hours. CBC: Recent Labs  Lab 03/05/20 2012 03/06/20 0435  WBC 12.6* 3.6*  NEUTROABS 10.7*  --   HGB 10.3* 10.6*  HCT 33.6* 34.3*  MCV 87.3 85.8  PLT 247 252   Cardiac Enzymes: No results for input(s): CKTOTAL, CKMB, CKMBINDEX, TROPONINI in the last 168 hours. BNP: Invalid input(s): POCBNP CBG: Recent Labs  Lab 03/05/20 2129  GLUCAP 156*   D-Dimer No results for input(s): DDIMER in the last 72 hours. Hgb A1c Recent Labs    03/06/20 0435  HGBA1C 6.3*   Lipid Profile No results for input(s): CHOL, HDL, LDLCALC, TRIG, CHOLHDL, LDLDIRECT in the last 72 hours. Thyroid function studies No results for input(s): TSH, T4TOTAL, T3FREE, THYROIDAB in the last 72 hours.  Invalid input(s): FREET3 Anemia work up No results for input(s): VITAMINB12, FOLATE, FERRITIN, TIBC, IRON, RETICCTPCT in the last 72 hours. Urinalysis    Component Value Date/Time   COLORURINE YELLOW 03/06/2020 1000   APPEARANCEUR CLEAR 03/06/2020 1000   LABSPEC 1.012 03/06/2020 1000   PHURINE 6.0 03/06/2020 1000    GLUCOSEU 50 (A) 03/06/2020 1000   HGBUR NEGATIVE 03/06/2020 1000   BILIRUBINUR NEGATIVE 03/06/2020 1000   KETONESUR NEGATIVE 03/06/2020 1000   PROTEINUR 30 (A) 03/06/2020 1000   UROBILINOGEN 0.2 11/12/2012 1529   NITRITE NEGATIVE 03/06/2020 1000   LEUKOCYTESUR NEGATIVE 03/06/2020 1000   Sepsis Labs Invalid input(s): PROCALCITONIN,  WBC,  LACTICIDVEN Microbiology Recent Results (from the past 240 hour(s))  SARS Coronavirus 2 by RT PCR (hospital order, performed in [place] hospital lab) Nasopharyngeal Nasopharyngeal Swab     Status: None   Collection Time: 03/05/20 10:01 PM   Specimen: Nasopharyngeal Swab  Result Value Ref Range Status   SARS Coronavirus 2 NEGATIVE NEGATIVE Final    Comment: (NOTE) SARS-CoV-2 target nucleic acids are NOT DETECTED. The SARS-CoV-2 RNA is generally detectable in upper and lower respiratory specimens during the acute phase of infection. The lowest concentration of SARS-CoV-2 viral copies this assay can detect is 250 copies / mL. A negative result does not preclude SARS-CoV-2 infection and should not be used as the sole basis for treatment or other patient management decisions.  A negative result may occur with improper specimen collection / handling, submission of specimen other than nasopharyngeal swab, presence of viral mutation(s) within the areas targeted by this assay, and inadequate number of viral copies (<250 copies / mL). A negative result must be combined with clinical observations, patient history, and epidemiological information. Fact Sheet for Patients:   StrictlyIdeas.no Fact Sheet for Healthcare Providers: BankingDealers.co.za This test is not yet approved or cleared  by the [place] FDA and has been authorized for detection and/or diagnosis of SARS-CoV-2 by FDA under an Emergency Use Authorization (EUA).  This EUA will remain in effect (meaning this test can be used) for the  duration of the COVID-19 declaration under Section 564(b)(1) of the Act, 21 U.S.C. section 360bbb-3(b)(1), unless the authorization is terminated or revoked sooner. Performed at [facility], [place] [address] [place], [place] [address]    Time spent: 30 min  SIGNED:   Marylu Lund, MD  [facility] 03/06/2020, 11:53 AM  If 7PM-7AM, please contact night-coverage

## 2020-03-06 NOTE — Progress Notes (Signed)
Spoke with patient's wife James [name] and updated her on the discharge instructions. She apologized for her daughter and husband leaving without discharge being complete. All questions answered.

## 2020-03-10 LAB — CULTURE, BLOOD (ROUTINE X 2)
Culture: NO GROWTH
Culture: NO GROWTH
Special Requests: ADEQUATE

## 2020-12-14 ENCOUNTER — Emergency Department (HOSPITAL_COMMUNITY)
Admission: EM | Admit: 2020-12-14 | Discharge: 2020-12-14 | Disposition: A | Discharge status: Home / Self Care | Payer: No Typology Code available for payment source | Attending: Emergency Medicine | Admitting: Emergency Medicine

## 2020-12-14 ENCOUNTER — Other Ambulatory Visit: Payer: Self-pay

## 2020-12-14 ENCOUNTER — Emergency Department (HOSPITAL_COMMUNITY): Payer: No Typology Code available for payment source

## 2020-12-14 DIAGNOSIS — I10 Essential (primary) hypertension: Secondary | ICD-10-CM | POA: Diagnosis not present

## 2020-12-14 DIAGNOSIS — Z87891 Personal history of nicotine dependence: Secondary | ICD-10-CM | POA: Insufficient documentation

## 2020-12-14 DIAGNOSIS — Z20822 Contact with and (suspected) exposure to covid-19: Secondary | ICD-10-CM | POA: Diagnosis not present

## 2020-12-14 DIAGNOSIS — J441 Chronic obstructive pulmonary disease with (acute) exacerbation: Secondary | ICD-10-CM

## 2020-12-14 DIAGNOSIS — Z85118 Personal history of other malignant neoplasm of bronchus and lung: Secondary | ICD-10-CM | POA: Diagnosis not present

## 2020-12-14 DIAGNOSIS — R0602 Shortness of breath: Secondary | ICD-10-CM | POA: Diagnosis present

## 2020-12-14 DIAGNOSIS — F0281 Dementia in other diseases classified elsewhere with behavioral disturbance: Secondary | ICD-10-CM | POA: Diagnosis not present

## 2020-12-14 DIAGNOSIS — Z8505 Personal history of malignant neoplasm of liver: Secondary | ICD-10-CM | POA: Insufficient documentation

## 2020-12-14 LAB — URINALYSIS, ROUTINE W REFLEX MICROSCOPIC
Bilirubin Urine: NEGATIVE
Glucose, UA: NEGATIVE mg/dL
Hgb urine dipstick: NEGATIVE
Ketones, ur: NEGATIVE mg/dL
Leukocytes,Ua: NEGATIVE
Nitrite: NEGATIVE
Protein, ur: NEGATIVE mg/dL
Specific Gravity, Urine: 1.004 — ABNORMAL LOW (ref 1.005–1.030)
pH: 6 (ref 5.0–8.0)

## 2020-12-14 LAB — CBC
HCT: 42.7 % (ref 39.0–52.0)
Hemoglobin: 13.5 g/dL (ref 13.0–17.0)
MCH: 29.3 pg (ref 26.0–34.0)
MCHC: 31.6 g/dL (ref 30.0–36.0)
MCV: 92.6 fL (ref 80.0–100.0)
Platelets: 256 10*3/uL (ref 150–400)
RBC: 4.61 MIL/uL (ref 4.22–5.81)
RDW: 17.2 % — ABNORMAL HIGH (ref 11.5–15.5)
WBC: 8.3 10*3/uL (ref 4.0–10.5)
nRBC: 0 % (ref 0.0–0.2)

## 2020-12-14 LAB — BASIC METABOLIC PANEL
Anion gap: 11 (ref 5–15)
BUN: 20 mg/dL (ref 8–23)
CO2: 28 mmol/L (ref 22–32)
Calcium: 9 mg/dL (ref 8.9–10.3)
Chloride: 97 mmol/L — ABNORMAL LOW (ref 98–111)
Creatinine, Ser: 1.05 mg/dL (ref 0.61–1.24)
GFR, Estimated: >60 mL/min (ref >60)
Glucose, Bld: 93 mg/dL (ref 70–99)
Potassium: 3.4 mmol/L — ABNORMAL LOW (ref 3.5–5.1)
Sodium: 136 mmol/L (ref 135–145)

## 2020-12-14 LAB — RESP PANEL BY RT-PCR (FLU A&B, COVID) ARPGX2
Influenza A by PCR: NEGATIVE
Influenza B by PCR: NEGATIVE
SARS Coronavirus 2 by RT PCR: NEGATIVE

## 2020-12-14 LAB — TROPONIN I (HIGH SENSITIVITY): Troponin I (High Sensitivity): 6 ng/L (ref <18)

## 2020-12-14 LAB — BRAIN NATRIURETIC PEPTIDE: B Natriuretic Peptide: 116.5 pg/mL — ABNORMAL HIGH (ref 0.0–100.0)

## 2020-12-14 MED ORDER — IPRATROPIUM-ALBUTEROL 0.5-2.5 (3) MG/3ML IN SOLN
3.0000 mL | Freq: Once | RESPIRATORY_TRACT | Status: AC
  Administered 2020-12-14: 3 mL via RESPIRATORY_TRACT
  Filled 2020-12-14: qty 3

## 2020-12-14 MED ORDER — METHYLPREDNISOLONE SODIUM SUCC 125 MG IJ SOLR
125.0000 mg | Freq: Once | INTRAMUSCULAR | Status: AC
  Administered 2020-12-14: 125 mg via INTRAVENOUS
  Filled 2020-12-14: qty 2

## 2020-12-14 MED ORDER — DOXYCYCLINE HYCLATE 100 MG PO TABS
100.0000 mg | ORAL_TABLET | Freq: Once | ORAL | Status: AC
  Administered 2020-12-14: 100 mg via ORAL
  Filled 2020-12-14: qty 1

## 2020-12-14 MED ORDER — DOXYCYCLINE HYCLATE 100 MG PO CAPS: 100.0000 mg | ORAL_CAPSULE | Freq: Two times a day (BID) | ORAL | 0 refills | Status: AC

## 2020-12-14 MED ORDER — ALBUTEROL SULFATE HFA 108 (90 BASE) MCG/ACT IN AERS
2.0000 | INHALATION_SPRAY | Freq: Once | RESPIRATORY_TRACT | Status: AC
  Administered 2020-12-14: 2 via RESPIRATORY_TRACT
  Filled 2020-12-14: qty 6.7

## 2020-12-14 MED ORDER — PREDNISONE 20 MG PO TABS: 40.0000 mg | ORAL_TABLET | Freq: Every day | ORAL | 0 refills | Status: AC

## 2020-12-14 NOTE — Discharge Instructions (Addendum)
You were seen in the emergency room today with cough and shortness of breath.  I suspect that your COPD is flaring causing your symptoms.  Please take the steroids and antibiotics as prescribed.  Use your albuterol inhaler and/or nebulizer medications at home as prescribed.  Please follow closely with your primary care doctor in the coming week.  If your symptoms suddenly worsen or you develop chest pain, fever, confusion, please return to the emergency department immediately or call 911 for evaluation.

## 2020-12-14 NOTE — ED Provider Notes (Signed)
Emergency Department Provider Note   I have reviewed the triage vital signs and the nursing notes.   HISTORY  Chief Complaint Shortness of Breath and Hypotension   HPI James [name] is a 70 y.o. male with past medical history reviewed below including COPD presents to the emergency department for evaluation with his daughter.  Patient lives at home with his wife.  He has been having cough which is productive shortness of breath with wheezing over the past couple of days.  Patient arrives by EMS report family took blood pressures at home which were hypotensive.  They did not record any hypotensive blood pressures.  Patient denies any passing out but has had some fatigue.  His daughter notes that he has home help 3 days a week and she along with the home health nurse felt like his breathing was worse than normal.  He denies any chest pain, abdominal pain, back pain.  No unilateral weakness or numbness.  No headaches.  He has been using his albuterol nebulizer at home 4 times per day with no relief.   Past Medical History:  Diagnosis Date  . Aorta aneurysm (HCC)    4.2 ascending thoracic aortic aneursym without evidence of abdominal aortic aneurysm on CTA 11/07/12  . Aortic aneurysm ([place])    "unrepaired; has it checked q yr" (05/12/2013)  . BPH (benign prostatic hypertrophy)   . Cancer of lung [facility])    s/p resection in 2005 at [facility] / [place] patient  . Chronic back pain started in 1990's  . Chronic lower back pain   . Chronic respiratory failure with hypoxia, on home O2 therapy (HCC)    3lpm with exertion  . Colitis   . COPD (chronic obstructive pulmonary disease) ([place])   . Exertional shortness of breath   . Exposure to Agent Orange    [place] War  . GERD (gastroesophageal reflux disease)   . High cholesterol   . Hypertension   . Liver cancer ([place]) 2017   treated at [facility]  . LUNG CANCER, HX OF 11/01/2009   Qualifier: Diagnosis of  By: Ronnald Ramp MD, Arvid Right.   . OSA on CPAP   . PAD  (peripheral artery disease) [facility])     Patient Active Problem List   Diagnosis Date Noted  . Hypotension 03/06/2020  . Pressure injury of skin 03/06/2020  . Hyponatremia 12/27/2019  . Pneumonia of left lower lobe due to infectious organism 12/27/2019  . Dementia with behavioral disturbance ([place]) 12/27/2019  . COPD (chronic obstructive pulmonary disease) ([place]) 12/27/2019  . SBO (small bowel obstruction) ([place]) 01/30/2016  . Pre-operative cardiovascular examination, supraventricular arrhythmia 04/04/2013  . Shortness of breath   . Post-operative state 12/02/2012  . Ischemic colitis ([place]) 11/13/2012  . Paroxysmal atrial fibrillation ([place]) 11/07/2012  . COPD with acute exacerbation ([place]) 11/07/2012  . Elevated alkaline phosphatase level 11/07/2012  . Dyslipidemia 11/01/2009  . Essential hypertension 11/01/2009  . PERIPHERAL VASCULAR DISEASE 11/01/2009  . GERD 11/01/2009  . BPH (benign prostatic hyperplasia) 11/01/2009  . HERNIATED LUMBAR DISC 11/01/2009  . LOW BACK PAIN 11/01/2009  . LUNG CANCER, HX OF 11/01/2009  . DIVERTICULITIS, HX OF 11/01/2009    Past Surgical History:  Procedure Laterality Date  . COLON SURGERY    . COLOSTOMY  11/08/2012  . COLOSTOMY CLOSURE N/A 05/12/2013   Procedure: Exploratory Laparotomy, Lysis of adhesions, Colon resection, Proctoscope, COLOSTOMY CLOSURE;  Surgeon: Joyice Faster. Cornett, MD;  Location: [place];  Service: General;  Laterality: N/A;  yellow fin stirrups   . COLOSTOMY REVERSAL  05/12/2013  . FEMORAL ARTERY STENT Right ?2002  . LAPAROTOMY N/A 11/08/2012   Procedure: EXPLORATORY LAPAROTOMY with descending and sigmoid colectomy; wound vac placement and colostomy;  Surgeon: Joyice Faster. Cornett, MD;  Location: [place];  Service: General;  Laterality: N/A;  . LUNG LOBECTOMY Left 2005   "upper lobe; bronchial tube replaced w/piece of lining of his rib" (05/12/2013)    Allergies Amlodipine besy-benazepril hcl, Meloxicam, and Penicillins  Family History   Problem Relation Age of Onset  . Heart attack Mother   . Tuberculosis Mother   . Heart attack Father   . Stroke Father   . Heart attack Brother   . Heart disease Sister   . Cancer Brother        Prostate    Social History Social History   Tobacco Use  . Smoking status: Former Smoker    Packs/day: 1.00    Years: 30.00    Pack years: 30.00    Types: Cigarettes    Quit date: 10/02/2003    Years since quitting: 17.2  . Smokeless tobacco: Current User    Types: Chew  Substance Use Topics  . Alcohol use: No  . Drug use: No    Review of Systems  Constitutional: No fever/chills. Positive fatigue.  Eyes: No visual changes. ENT: No sore throat. Cardiovascular: Denies chest pain. Low BP at home.  Respiratory: Positive shortness of breath and wheezing.  Gastrointestinal: No abdominal pain.  No nausea, no vomiting.  No diarrhea.  No constipation. Genitourinary: Negative for dysuria. Musculoskeletal: Negative for back pain. Skin: Negative for rash. Neurological: Negative for headaches, focal weakness or numbness.  10-point ROS otherwise negative.  ____________________________________________   PHYSICAL EXAM:  VITAL SIGNS: ED Triage Vitals  Enc Vitals Group     BP 12/14/20 1632 (!) 160/77     Pulse Rate 12/14/20 1632 70     Resp 12/14/20 1632 17     Temp 12/14/20 1632 97.6 F (36.4 C)     Temp src --      SpO2 12/14/20 1632 96 %     Weight 12/14/20 1826 185 lb (83.9 kg)     Height 12/14/20 1826 6\' 3"  (1.905 m)   Constitutional: Alert and oriented. Well appearing and in no acute distress but frequent coughing.  Eyes: Conjunctivae are normal. Head: Atraumatic. Nose: No congestion/rhinnorhea. Mouth/Throat: Mucous membranes are moist. Neck: No stridor.  Cardiovascular: Normal rate, regular rhythm. Good peripheral circulation. Grossly normal heart sounds.   Respiratory: Increased respiratory effort.  No retractions. Lungs with coarse bilateral wheezing.   Gastrointestinal: Soft and nontender. No distention.  Musculoskeletal: No lower extremity tenderness nor edema. No gross deformities of extremities. Neurologic:  Normal speech and language. No gross focal neurologic deficits are appreciated.  Skin:  Skin is warm, dry and intact. No rash noted.   ____________________________________________   LABS (all labs ordered are listed, but only abnormal results are displayed)  Labs Reviewed  BASIC METABOLIC PANEL - Abnormal; Notable for the following components:      Result Value   Potassium 3.4 (*)    Chloride 97 (*)    All other components within normal limits  CBC - Abnormal; Notable for the following components:   RDW 17.2 (*)    All other components within normal limits  URINALYSIS, ROUTINE W REFLEX MICROSCOPIC - Abnormal; Notable for the following components:   Color, Urine STRAW (*)    Specific Gravity, Urine 1.004 (*)  All other components within normal limits  BRAIN NATRIURETIC PEPTIDE - Abnormal; Notable for the following components:   B Natriuretic Peptide 116.5 (*)    All other components within normal limits  RESP PANEL BY RT-PCR (FLU A&B, COVID) ARPGX2  CBG MONITORING, ED  TROPONIN I (HIGH SENSITIVITY)  TROPONIN I (HIGH SENSITIVITY)   ____________________________________________  EKG   EKG Interpretation  Date/Time:  Wednesday December 14 2020 16:36:26 EDT Ventricular Rate:  73 PR Interval:    QRS Duration: 98 QT Interval:  422 QTC Calculation: 464 R Axis:   82 Text Interpretation: Atrial fibrillation Minimal voltage criteria for LVH, may be normal variant ( Sokolow-Lyon ) Possible Anterior infarct , age undetermined Abnormal ECG Confirmed by Nanda Quinton [phone]) on 12/14/2020 6:59:37 PM       ____________________________________________  RADIOLOGY  DG Chest 2 View  Result Date: 12/14/2020 CLINICAL DATA:  Shortness of breath and weakness. History of hypotension. EXAM: CHEST - 2 VIEW COMPARISON:  December 31, 2019 FINDINGS: Trachea midline. Cardiomediastinal contours and hilar structures are unchanged. Postoperative changes with suture material in the RIGHT lung apex is similar to previous imaging. Juxta diaphragmatic atelectasis and or scarring at the LEFT lung base with similar appearance to prior studies. No sign of effusion. On limited assessment no acute skeletal process. IMPRESSION: LEFT lower lobe airspace disease favored to represent scarring or atelectasis. Difficult to exclude superimposed infection but little change is noted since March of 2021. Electronically Signed   By: Zetta Bills M.D.   On: 12/14/2020 17:55    ____________________________________________   PROCEDURES  Procedure(s) performed:   Procedures  None  ____________________________________________   INITIAL IMPRESSION / ASSESSMENT AND PLAN / ED COURSE  Pertinent labs & imaging results that were available during my care of the patient were reviewed by me and considered in my medical decision making (see chart for details).   Patient presents emergency department with cough and shortness of breath.  He is not hypotensive with EMS or here in the emergency department and in fact has elevated blood pressures.  He is not hypoxemic but does have some increased work of breathing which is mild to moderate along with coarse wheezing bilaterally.  Suspect COPD exacerbation clinically.  Chest x-ray looks essentially unchanged from March 2021 but difficult to exclude some superimposed infection per radiology read.  No leukocytosis.  Plan for Solu-Medrol, COVID/flu testing, albuterol nebs and reassess.  Patient's EKG is similar to his prior tracings. Low suspicion clinically for ACS/PE given history and significant wheezing on exam.   09:05 PM  Reassessed patient after nebulizer.  He is feeling much better and wheezing on reexamination is significantly improved.  He is not exhibiting increased work of breathing.  He has not developed  hypoxemia.  His troponin is normal.  BNP is only mildly elevated.  We discussed management options including admission for further breathing treatments versus discharge home.  Patient preferred to try at home which seems reasonable given his improvement here in the ED and no oxygen requirement.  He has nebulizer solution and albuterol inhaler provided at discharge.  Plan for antibiotic and steroid burst.  Daughters at bedside feels comfortable with the plan at discharge.  Discussed ED return precautions in detail.  Patient to call the PCP in the morning to schedule follow-up appointment the coming week.  ____________________________________________  FINAL CLINICAL IMPRESSION(S) / ED DIAGNOSES  Final diagnoses:  COPD exacerbation ([place])     MEDICATIONS GIVEN DURING THIS VISIT:  Medications  doxycycline (  VIBRA-TABS) tablet 100 mg (has no administration in time range)  ipratropium-albuterol (DUONEB) 0.5-2.5 (3) MG/3ML nebulizer solution 3 mL (3 mLs Nebulization Given 12/14/20 1901)  albuterol (VENTOLIN HFA) 108 (90 Base) MCG/ACT inhaler 2 puff (2 puffs Inhalation Given 12/14/20 1901)  methylPREDNISolone sodium succinate (SOLU-MEDROL) 125 mg/2 mL injection 125 mg (125 mg Intravenous Given 12/14/20 1901)     NEW OUTPATIENT MEDICATIONS STARTED DURING THIS VISIT:  New Prescriptions   DOXYCYCLINE (VIBRAMYCIN) 100 MG CAPSULE    Take 1 capsule (100 mg total) by mouth 2 (two) times daily for 7 days.   PREDNISONE (DELTASONE) 20 MG TABLET    Take 2 tablets (40 mg total) by mouth daily for 5 days.    Note:  This document was prepared using Dragon voice recognition software and may include unintentional dictation errors.  Nanda Quinton, MD, [facility] Emergency Medicine    Shailah Gibbins, [facility], MD 12/14/20 2120

## 2020-12-14 NOTE — ED Notes (Signed)
Patient verbalizes understanding of discharge instructions. Prescriptions reviewed with pt and family member. Opportunity for questioning and answers were provided. Armband removed by staff, pt discharged from ED ambulatory with family member.

## 2020-12-14 NOTE — ED Triage Notes (Signed)
Pt reports shortness of breath and hypotension all week. Pt has in the past (but not currently) worn supplemental O2 at home, so wife was administering it as needed, in addition to an old prescription of prednisone. Taking BP at home, has list of several readings in the 02R systolic, although normotensive with EMS. Pt has history of dementia and is at his baseline, just c/o shob and generalized weakness. SpO2 88% room air.

## 2021-10-01 DEATH — deceased
